# Patient Record
Sex: Female | Born: 1990 | Race: White | Hispanic: No | Marital: Single | Smoking: Never smoker
Health system: Southern US, Community
[De-identification: ages and names within clinical notes are randomized; demographics above are authoritative.]

## PROBLEM LIST (undated history)

## (undated) DIAGNOSIS — B009 Herpesviral infection, unspecified: Secondary | ICD-10-CM

## (undated) DIAGNOSIS — F3281 Premenstrual dysphoric disorder: Secondary | ICD-10-CM

## (undated) DIAGNOSIS — T7840XA Allergy, unspecified, initial encounter: Secondary | ICD-10-CM

## (undated) DIAGNOSIS — R42 Dizziness and giddiness: Secondary | ICD-10-CM

## (undated) DIAGNOSIS — E559 Vitamin D deficiency, unspecified: Secondary | ICD-10-CM

## (undated) DIAGNOSIS — F32A Depression, unspecified: Secondary | ICD-10-CM

## (undated) DIAGNOSIS — F419 Anxiety disorder, unspecified: Secondary | ICD-10-CM

## (undated) DIAGNOSIS — J45909 Unspecified asthma, uncomplicated: Secondary | ICD-10-CM

## (undated) DIAGNOSIS — F329 Major depressive disorder, single episode, unspecified: Secondary | ICD-10-CM

## (undated) DIAGNOSIS — K3184 Gastroparesis: Secondary | ICD-10-CM

## (undated) DIAGNOSIS — G4730 Sleep apnea, unspecified: Secondary | ICD-10-CM

## (undated) HISTORY — PX: APPENDECTOMY: SHX54

## (undated) HISTORY — DX: Sleep apnea, unspecified: G47.30

## (undated) HISTORY — DX: Vitamin D deficiency, unspecified: E55.9

## (undated) HISTORY — PX: LUMBAR DISC SURGERY: SHX700

## (undated) HISTORY — DX: Depression, unspecified: F32.A

## (undated) HISTORY — DX: Anxiety disorder, unspecified: F41.9

## (undated) HISTORY — DX: Major depressive disorder, single episode, unspecified: F32.9

## (undated) HISTORY — DX: Unspecified asthma, uncomplicated: J45.909

## (undated) HISTORY — DX: Gastroparesis: K31.84

## (undated) HISTORY — PX: ADENOIDECTOMY: SUR15

## (undated) HISTORY — DX: Herpesviral infection, unspecified: B00.9

## (undated) HISTORY — DX: Dizziness and giddiness: R42

## (undated) HISTORY — DX: Allergy, unspecified, initial encounter: T78.40XA

## (undated) HISTORY — DX: Premenstrual dysphoric disorder: F32.81

## (undated) HISTORY — PX: TONSILLECTOMY: SUR1361

## (undated) HISTORY — PX: TYMPANOSTOMY TUBE PLACEMENT: SHX32

## (undated) HISTORY — PX: CHOLECYSTECTOMY: SHX55

## (undated) HISTORY — PX: SPINE SURGERY: SHX786

## (undated) HISTORY — DX: Morbid (severe) obesity due to excess calories: E66.01

---
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
## 2009-04-11 ENCOUNTER — Encounter: Payer: Self-pay | Admitting: Family Medicine

## 2009-04-18 ENCOUNTER — Encounter: Payer: Self-pay | Admitting: Family Medicine

## 2009-09-01 ENCOUNTER — Ambulatory Visit: Payer: Self-pay | Admitting: Family Medicine

## 2009-09-18 HISTORY — PX: CHOLECYSTECTOMY: SHX55

## 2009-10-03 ENCOUNTER — Ambulatory Visit: Payer: Self-pay | Admitting: General Surgery

## 2009-10-10 ENCOUNTER — Ambulatory Visit: Payer: Self-pay | Admitting: General Surgery

## 2010-01-23 ENCOUNTER — Ambulatory Visit: Payer: Self-pay | Admitting: Family Medicine

## 2010-04-30 ENCOUNTER — Ambulatory Visit: Payer: Self-pay | Admitting: Gastroenterology

## 2010-07-11 ENCOUNTER — Ambulatory Visit: Payer: Self-pay | Admitting: Gastroenterology

## 2011-09-13 IMAGING — NM NUCLEAR MEDICINE HEPATOHBILIARY INCLUDE GB
2 series · 12 of 12 positions shown · non-contrast
Comparison: none

REASON FOR EXAM: RUQ abd pain
COMMENTS:

[Series 1000: gallbladder dynamic · 4.80mm/px · 6 of 60 frames shown]
[frame 6/60]
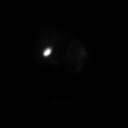
[frame 16/60]
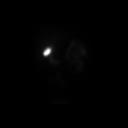
[frame 26/60]
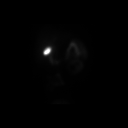
[frame 36/60]
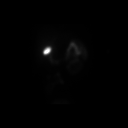
[frame 46/60]
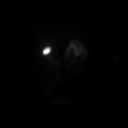
[frame 56/60]
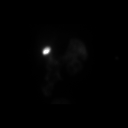

[Series 1000: gallbladder dynamic (results) · 4.80mm/px · 6 of 60 frames shown]
[frame 6/60]
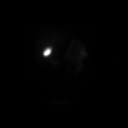
[frame 16/60]
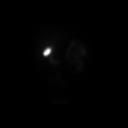
[frame 26/60]
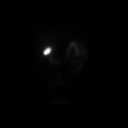
[frame 36/60]
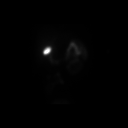
[frame 46/60]
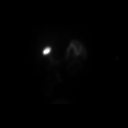
[frame 56/60]
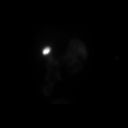

[12 of 12 positions shown; findings below may reference images not displayed]

PROCEDURE:     NM  - NM HEPATO WITH GB EJECT FRACTION  - September 01, 2009 [DATE]

RESULT:     Following intravenous administration of 8.04 mCi technetium 99m
Choletec, there is no prompt visualization of tracer activity in the liver
at 3 minutes. At 15 minutes, tracer activity is visualized in the
gallbladder, common duct and proximal small bowel.

The gallbladder ejection fraction at 30 minutes measures 29% which is below
the normal range.
IMPRESSION: 1. Normal hepatobiliary scan.
2. The gallbladder ejection fraction measures 29% which is below the normal
range.

## 2012-09-24 LAB — HM PAP SMEAR: HM Pap smear: NORMAL

## 2012-09-24 LAB — LIPID PANEL
Cholesterol: 171 mg/dL (ref 0–200)
HDL: 62 mg/dL (ref 35–70)
LDL Cholesterol: 90 mg/dL
Triglycerides: 97 mg/dL (ref 40–160)

## 2012-09-24 LAB — HEMOGLOBIN A1C: HEMOGLOBIN A1C: 5.5 % (ref 4.0–6.0)

## 2012-12-09 ENCOUNTER — Emergency Department: Payer: Self-pay | Admitting: Unknown Physician Specialty

## 2012-12-09 LAB — URINALYSIS, COMPLETE
Bilirubin,UR: NEGATIVE
Blood: NEGATIVE
Glucose,UR: NEGATIVE mg/dL (ref 0–75)
Ketone: NEGATIVE
Protein: NEGATIVE
RBC,UR: 2 /HPF (ref 0–5)
Specific Gravity: 1.023 (ref 1.003–1.030)

## 2012-12-29 DIAGNOSIS — Z9889 Other specified postprocedural states: Secondary | ICD-10-CM | POA: Insufficient documentation

## 2013-03-18 HISTORY — PX: APPENDECTOMY: SHX54

## 2013-04-08 LAB — COMPREHENSIVE METABOLIC PANEL
BUN: 14 mg/dL (ref 7–18)
Calcium, Total: 9.4 mg/dL (ref 8.5–10.1)
Co2: 25 mmol/L (ref 21–32)
Creatinine: 0.74 mg/dL (ref 0.60–1.30)
EGFR (African American): >60
EGFR (Non-African Amer.): >60
Osmolality: 272 (ref 275–301)
Potassium: 4.1 mmol/L (ref 3.5–5.1)
SGOT(AST): 22 U/L (ref 15–37)
SGPT (ALT): 29 U/L (ref 12–78)
Sodium: 136 mmol/L (ref 136–145)
Total Protein: 7.7 g/dL (ref 6.4–8.2)

## 2013-04-08 LAB — CBC
HCT: 39.2 % (ref 35.0–47.0)
HGB: 13 g/dL (ref 12.0–16.0)
MCH: 30.4 pg (ref 26.0–34.0)
MCV: 92 fL (ref 80–100)
Platelet: 239 10*3/uL (ref 150–440)
RBC: 4.27 10*6/uL (ref 3.80–5.20)
WBC: 15.2 10*3/uL — ABNORMAL HIGH (ref 3.6–11.0)

## 2013-04-08 LAB — URINALYSIS, COMPLETE
Bilirubin,UR: NEGATIVE
Glucose,UR: NEGATIVE mg/dL (ref 0–75)
Ketone: NEGATIVE
Ph: 6 (ref 4.5–8.0)
RBC,UR: 1 /HPF (ref 0–5)
Specific Gravity: 1.01 (ref 1.003–1.030)
WBC UR: 1 /HPF (ref 0–5)

## 2013-04-08 LAB — LIPASE, BLOOD: Lipase: 74 U/L (ref 73–393)

## 2013-04-09 ENCOUNTER — Observation Stay: Payer: Self-pay | Admitting: Surgery

## 2013-04-13 LAB — PATHOLOGY REPORT

## 2013-09-20 DIAGNOSIS — G43809 Other migraine, not intractable, without status migrainosus: Secondary | ICD-10-CM | POA: Insufficient documentation

## 2013-09-20 DIAGNOSIS — K589 Irritable bowel syndrome without diarrhea: Secondary | ICD-10-CM | POA: Insufficient documentation

## 2015-03-10 NOTE — H&P (Signed)
PATIENT NAME:  Tracey Perry, GUGGENHEIM MR#:  [id number] DATE OF BIRTH:  1991/10/22  DATE OF ADMISSION:  04/08/2013  CHIEF COMPLAINT:  Right lower quadrant pain.   HISTORY OF PRESENT ILLNESS:  This is a patient with approximately 18 hours of abdominal pain, started this morning when she woke up, stating that she was normal yesterday.  She has had nausea and multiple emeses.  Ate lunch at 1:30 this afternoon, but has been vomiting since.  She denies fevers or chills, has never had an episode like this before.  Has had some loose stools.  Pain is in the right lower quadrant only.  Denies back pain or dysuria.   PAST MEDICAL HISTORY:  Anxiety, asthma, depression.   ALLERGIES:  None.   MEDICATIONS:  Multiple, see chart.   FAMILY HISTORY:  Noncontributory.   PAST SURGICAL HISTORY:  Laparoscopic cholecystectomy in 2010 by Dr. Bary Castilla, recent cervical fusion in [place].  Tonsillectomy, adenoidectomy and ear tubes.   SOCIAL HISTORY:  The patient does not smoke or drink.  She is currently employed.   REVIEW OF SYSTEMS:  A 10 system review is performed and negative with the exception of that mentioned in the history of present illness.   PHYSICAL EXAMINATION: GENERAL:  Healthy, comfortable-appearing Caucasian female patient.  She has a BMI of 40, 240 pounds, 65 inches.  VITAL SIGNS:  Temp of 98.5, pulse of 98, respirations 18, blood pressure 115/70, 99% room air sats.  Pain scale says 0, was originally 7 and her pain scale was higher than that when I visited her.  She had not been medicated.  HEENT:  Shows no scleral icterus.  NECK:  No palpable neck nodes.  CHEST:  Clear to auscultation.  CARDIAC:  Regular rate and rhythm.  ABDOMEN:  Is showing right upper quadrant and periumbilical scars from prior laparoscopy and cholecystectomy.  She is maximally tender at McBurney's point with a positive Rovsing's sign.  No guarding.  There is some percussion tenderness.  EXTREMITIES:  Without edema.   NEUROLOGIC:  Grossly intact.  INTEGUMENT:  Shows no jaundice.   LABORATORY VALUES:  Demonstrate normal electrolytes.  White blood cell count is elevated at 15.2.  H and H of 13 and 39, a platelet count of 239.   CT scan is personally reviewed showing probable appendicitis.   ASSESSMENT AND PLAN:  This is a patient with probable appendicitis, both by history, physical and CT scanning.  She also has leukocytosis.  I have recommended laparoscopic appendectomy.  The rationale for this has been discussed.  The options of observation have been reviewed and the risk of bleeding, infection, recurrence of symptoms, failure to resolve her symptoms, negative laparoscopy and conversion to an open procedure, as well as bowel injury were all reviewed with her.  She understood and agreed to proceed.     ____________________________ Jerrol Banana Burt Knack, MD rec:ea D: 04/08/2013 22:58:19 ET T: 04/08/2013 23:22:43 ET JOB#: [id number]  cc: Jerrol Banana. Burt Knack, MD, <Dictator> Florene Glen MD ELECTRONICALLY SIGNED 04/09/2013 6:50

## 2015-03-10 NOTE — Op Note (Signed)
PATIENT NAME:  Tracey Perry, Tracey Perry MR#:  [id number] DATE OF BIRTH:  December 26, 1990  DATE OF PROCEDURE:  04/09/2013  PREOPERATIVE DIAGNOSIS: Acute appendicitis.   POSTOPERATIVE DIAGNOSIS: Acute appendicitis.   PROCEDURE: Laparoscopic appendectomy.   SURGEON: Kaytie Ratcliffe E. Burt Knack, MD  ANESTHESIA: General with endotracheal tube.   INDICATIONS: This is a patient with progressive right lower quadrant pain and tenderness with signs of peritoneal irritation and workup showing probable appendicitis. Preoperatively, we discussed rationale for surgery, the options of observation, risks of bleeding, infection, recurrence of symptoms, failure to resolve her symptoms, conversion to an open procedure and negative laparoscopy. She understood and agreed to proceed.   FINDINGS: Acute appendicitis in a retrocecal position.   DESCRIPTION OF PROCEDURE: The patient was induced to general anesthesia, given IV antibiotics, VT prophylaxis was in place. She was prepped and draped in a sterile fashion. Marcaine was infiltrated in skin and subcutaneous tissues around the periumbilical area, where incision was made. Veress needle was placed. Pneumoperitoneum was obtained, and a 5 mm trocar port was placed. The abdominal cavity was explored, and under direct vision, a 12 mm left lateral port and a suprapubic 5 mm port were placed. The appendix was found in the right lateral position somewhat retrocecal. It was elevated. The base of the appendix was identified and dissected, and the standard load Endo GIA was fired across the base of the appendix, and then a vascular load Endo GIA was fired across the mesoappendix. The specimen was passed out through the lateral port site with the aid of an Endo Catch bag. The area was checked for hemostasis and found to be adequate with the exception of a bleeder on the staple line. This was controlled with a clip, and then hemostasis was checked, and at this point, it was adequate. There was no  further bleeding. The left lateral port site was closed with interrupted simple sutures of 0 Vicryl utilizing an Endo Close technique. Again, hemostasis was checked and found to be adequate. Therefore, pneumoperitoneum was released. All ports were removed. The 4-0 subcuticular Monocryl was used on all skin edges. Steri-Strips, Mastisol and sterile dressings were placed.   The patient tolerated the procedure well. There were no complications. She was taken to the recovery room in stable condition to be admitted for continued care.    ____________________________ Jerrol Banana. Burt Knack, MD rec:OSi D: 04/09/2013 02:06:11 ET T: 04/09/2013 06:43:45 ET JOB#: [id number]  cc: Jerrol Banana. Burt Knack, MD, <Dictator> Florene Glen MD ELECTRONICALLY SIGNED 04/16/2013 19:56

## 2015-03-10 NOTE — H&P (Signed)
Subjective/Chief Complaint RLQ pain   History of Present Illness started this am, rlq nop prior episode no f/c n/v worsening   Past History PMH anxiety asthma PSH ear tubes, T&A neck fusion lapchole   ALLERGIES:  No Known Allergies:   Family and Social History:  Family History Non-Contributory   Social History negative tobacco, negative ETOH   Place of Living Home   Review of Systems:  Fever/Chills No   Cough No   Abdominal Pain Yes   Constipation No   Nausea/Vomiting Yes   SOB/DOE No   Chest Pain No   Dysuria No   Tolerating Diet No  Nauseated  Vomiting   Physical Exam:  GEN no acute distress, obese   HEENT pink conjunctivae   NECK supple  left ant scar   RESP normal resp effort  clear BS   CARD regular rate   ABD positive tenderness  soft  scars, pos rosving's sign. tender rlq perc tenderness   LYMPH negative neck   EXTR negative edema   SKIN normal to palpation   PSYCH alert, A+O to time, place, person, good insight   Lab Results: Hepatic:  22-May-14 17:36   Bilirubin, Total 0.2  Alkaline Phosphatase 96  SGPT (ALT) 29  SGOT (AST) 22  Total Protein, Serum 7.7  Albumin, Serum 3.6  Routine Chem:  22-May-14 17:36   Glucose, Serum 86  BUN 14  Creatinine (comp) 0.74  Sodium, Serum 136  Potassium, Serum 4.1  Chloride, Serum 106  CO2, Serum 25  Calcium (Total), Serum 9.4  Osmolality (calc) 272  eGFR (African American) >60  eGFR (Non-African American) >60 (eGFR values <82m/min/1.73 m2 may be an indication of chronic kidney disease (CKD). Calculated eGFR is useful in patients with stable renal function. The eGFR calculation will not be reliable in acutely ill patients when serum creatinine is changing rapidly. It is not useful in  patients on dialysis. The eGFR calculation may not be applicable to patients at the low and high extremes of body sizes, pregnant women, and vegetarians.)  Anion Gap  5  Lipase 74 (Result(s) reported  on 08 Apr 2013 at 06:01PM.)  Routine UA:  22-May-14 17:36   Color (UA) Straw  Clarity (UA) Clear  Glucose (UA) Negative  Bilirubin (UA) Negative  Ketones (UA) Negative  Specific Gravity (UA) 1.010  Blood (UA) 2+  pH (UA) 6.0  Protein (UA) Negative  Nitrite (UA) Negative  Leukocyte Esterase (UA) Negative (Result(s) reported on 08 Apr 2013 at 06:38PM.)  RBC (UA) 1 /HPF  WBC (UA) 1 /HPF  Bacteria (UA) TRACE  Epithelial Cells (UA) 1 /HPF  Mucous (UA) PRESENT (Result(s) reported on 08 Apr 2013 at 06:38PM.)  Routine Hem:  22-May-14 17:36   WBC (CBC)  15.2  RBC (CBC) 4.27  Hemoglobin (CBC) 13.0  Hematocrit (CBC) 39.2  Platelet Count (CBC) 239 (Result(s) reported on 08 Apr 2013 at 05:56PM.)  MCV 92  MCH 30.4  MCHC 33.1  RDW 13.4   Radiology Results: CT:    22-May-14 22:04, CT Abdomen and Pelvis With Contrast  CT Abdomen and Pelvis With Contrast  REASON FOR EXAM:    (1) pain; (2) pain  COMMENTS:       PROCEDURE: CT  - CT ABDOMEN / PELVIS  W  - Apr 08 2013 10:04PM     RESULT: CT scanning was performed through the abdomen and pelvis with   reconstructions at 3 mm intervals and slice thicknesses. The patient   received 100  cc of Isovue-370 and also received oral contrast material.   Review of multiplanar reconstructed images was performed separately on   the VIA monitor.    On the scout topogram gaseous distention of the stomach as well as   numerous loops of small and large bowel is demonstrated. The orally   administered contrast however has traversed much of the small bowel and   reached as far distally as the proximal sigmoid colon. There is an     inflamed, edematous appendix demonstrated on images 90 through 114. It   measures approximately 13 mm in greatest dimension and there is   surrounding inflammatory change. There is no evidence of free fluid or   free air within the abdomen or pelvis.     The gallbladder is surgically absent. The liver, pancreas, spleen,    partially distended stomach, adrenal glands, and kidneys are normal in   appearance. The caliber of the abdominal aorta is normal. There is a   retroaortic left renal vein which is a normal variant. There is no   periaortic or pericaval lymphadenopathy. A few prominent lymph nodes are   demonstrated in the right aspect of the mesentery measuring up to 13 mm   in greatest dimension.    Within the pelvis the urinary bladder, uterus, and adnexal structures are   normal in appearance. There is no inguinal nor umbilical hernia.  The lung bases are clear. The lumbar vertebral bodies are preserved in   height.    IMPRESSION:   1. The appendix appears edematous and there is periappendiceal   inflammatory change. There is no evidence of a periappendiceal abscess.   There are a few mildly enlarged mesenteric lymph nodes to the right   midline. The findings are consistent with acute appendicitis. There is no   evidence of rupture.  2. There is no acute hepatobiliary abnormality nor acute urinary tract   abnormality.  3. The lung bases are clear.     Dictation Site: 5    Verified By: DAVID A. Martinique, M.D., MD    Assessment/Admission Diagnosis acute appendicitis rec lap appy risks and options rev'd agrees with plan   Electronic Signatures: Florene Glen (MD)  (Signed 22-May-14 22:54)  Authored: CHIEF COMPLAINT and HISTORY, ALLERGIES, FAMILY AND SOCIAL HISTORY, REVIEW OF SYSTEMS, PHYSICAL EXAM, LABS, Radiology, ASSESSMENT AND PLAN   Last Updated: 22-May-14 22:54 by Florene Glen (MD)

## 2015-04-25 ENCOUNTER — Encounter: Payer: Self-pay | Admitting: Licensed Clinical Social Worker

## 2015-04-25 NOTE — Progress Notes (Signed)
### [facility] LCSW Progress Note:    Tracey Perry 04/12/2015 10:08 AM Location: [facility] Patient #: [id number] DOB: 03-May-1991 Undefined / Language: Cleophus Molt / Race: White Female    History of Present Illness(Tracey Perry N Sollie Vultaggio; 04/24/2015 8:19 AM) The patient is a 24 year old female who presents for a recheck of Depressive disorders. Symptoms include depressed mood (Improved. Has episodic sadness yet denied this as disabling.), fatigue (At present this is related to recent dx of Mono.) and weight gain, while symptoms do not include loss of interest, hopelessness, crying spells, sense of failure, psychomotor retardation, appetite change, poor sleep, hypersomnia or irritability. The patient describes this as mild and improving. Symptoms are exacerbated by emotional stress, fatigue, family stressors, job stressors and menstrual cycles. Symptoms are relieved by antidepressants, psychotherapy and quiet environment. Note for "Depressive disorders": [name] has started seeing Dr. Cephus Shelling for medication management.  Additional reason for visit:  Recheck of Anxietyis described as the following: Symptoms include anxiety and fatigue, while symptoms do not include difficulty concentrating, excessive worry, insomnia, irritability, muscle tension, nervousness, panic attacks or sleep disruption. The patient describes this as mild and improving. Symptoms are exacerbated by new situations, financial problems, job stress and menstruation.  Note: Start Time: 10:08 a.m.  Tracey Perry returns to [facility] to address depression, mood swings, SIB and anxiety. Overall with increasing effectiveness of medications combined with client's own coping skills and use of CBT and other strategies, she is internalizing less. She discssued a set back below.  "It's better. I'm getting to work on time most days." About one week ago Tracey Perry described "the day from hell." Recalled time  recently where her mood was really up when spending time with female friend, Evelena Peat but then it crashed due to some work related harrassment. Current ethic female co-worker has made sexual comments about her breasts on two occasions. Because the company has an emphasis on having a certain amount of diversity. New female was hired who is nice and there is some hope that he will role model better before. "I'm actively uncomfortable being around him." She is having increased experiences of being "skiddish" around him. There is also a flirtatousness that ". . . gives me the creeps."  Client talked about a Saturday at work stating "You would have been proud of me. I was proud." "It was crazy busy at work." "It should have been a bad day." Followed up by going to a Church event with her father/girlfriend for a few hours.   "I was mad at myself because I let Tracey Perry' comments get to me." "There's my insight."  LCSW commended her on recognizing her triggers and applying various coping skills to distract herself and calm self to prevent emotional decompensation and behavioral impulsivity. Supportive psychotherapy with insight and emotional support and encouragement around her progress.  Explored her thoughts about outocome of therapy process on presenting problems. Sharlot voiced definite benefits from the psychoeducation, support and introduction of various therapeutic models available to improve coping skills.  No new concerns were voiced.  PLAN: Follow up within 2-3 weeks or PRN. Client will continue to take meds as prescribed by [name] and keep all appointments.    Medication History(Tracey Perry N Tracey Perry; 04/12/2015 11:07 AM) Viibryd (40MG  Tablet, 1 Oral daily, Taken starting 08/31/2014) Active. Wellbutrin XL (300MG  Tablet ER 24HR, 1 (one) Oral daily, Taken starting 08/31/2014) Active. Ativan (0.5MG  Tablet, 1 (one) Tablet Oral daily, Taken starting 08/31/2014) Active. QUEtiapine Fumarate (50MG   Tablet, 1 (one)  Tablet Oral at bedtime, Taken starting 08/31/2014) Active. Mirena (20MCG/24HR IUD, Intrauterine) Active. (Inserted about 2-2 1/2 months ago. She contributes this to improvement in moods.) Depakote (500MG  Tablet DR, 2 tabs Oral QHS) Active. Seasonique (0.15-0.03 &0.01MG  Tablet, Oral) Active. Claritin (10MG  Tablet, Oral) Active. Depakote (250MG  Tablet DR, Oral) Active. Vi t 2000 IU qdaily Active. ValACYclovir HCl (500MG  Tablet, Oral) Active. Magnesium Gluconate (250MG  Tablet, 2 Oral daily) Active. Gabapentin (300MG  Tablet, 1 tab Oral QHS) Active. (Added by her Neurosurgeon at [facility] to help nerve tingling)    Review of Systems([name] N Ky Moskowitz; 04/13/2015 8:55 AM) Psychiatric:Present- Anxiety (Directly related to car problems and likelihood that she will need to purchase a new car along with paying student loans.), Depression ("Today I'm good but tomorrow I'm not looking forward to."), Feels safe at home, Inability to Concentrate (Not depression related and more related to fatique per [name].), Insomnia ("It comes and goes.") and Mood changes (Described her mood swings as worse.). Not Present- Attention Deficit Disorder, Change in Sleep Pattern, Decrease attention, Decrease concentration, Delirium, Delusions, Disorientation, Early Awakening, Easily Irritated, Fearful, Frequent crying (More frequent in the past 3 weeks. These episodes are still not to the degree that were when depression was at it's worse.), Hallucinations, Hypersomnia, Impaired Cognitive Function, Memory Loss, Nervousness, Panic Attacks, Suicidal Ideation, Suicidal Planning and Trouble Falling Asleep. Note:No SIB reported.  LCSW had client to complete a Mood Disorder Questionnaire given that she has concern that she may have Bi-Polar disorder.  Of the items in Section 1, Client had 9 Yes and 4 No responses Under Section 2 she checked Yes Section 3 she indicated: Minor Problem  Finally, she answered  Yes to both feelings of being depressed, down, etc. and being bothered by little interest or pleasure in doing things.    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 Validated client's feelings and emotions voiced as part of the stressors experienced when living with chronic medical problems and mood disorders. 2. Encouraged client to highlight the positives in her own life and to focus on what she can and does do versus what she thinks she cannot or that others are keeping her from doing. 3. Psychoeducation on ineffective problem solving and the impact of cognitive restructuring to enhance outcomes and coping to include not seeing self as a victim in all circumstances. 4. Psychosocial support & therapy with insight and commended client on use of available protective factors.    Anxiety, generalized (300.02  F41.1) Current Plans l Short Term Goal: Identify Triggers for Symptoms  l Intervention: Stress Management 1. Explore cognitive messages that mediate anxiety response, i.e. family rules, roles & cultural messages for women and retrain in more realistic/rational beliefs. 2. Help client develop reality-based cognitive messages that will increase self-confidence in coping with both rational and irrational fears. 3. Educate on and reinforce appropriate development of boundaries to include realistic views of other people's approval and not depending on it to feel worthy or accepted.   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 Client verbal, denied SIB and no longer having urges or impulse to engage in harmful behaviors. Current Plans l Short Term Goal: Patient will be  Able to Communicate Needs or Concerns  l Intervention: Assertiveness Skills 1. Encouraged contacts with  LCSW and this clinic if symptoms worsen and also instructed to call 911 or come to the ER. Reinforced importance of self care, involvement in meaningful activities and with primary support people in his/her life. 2. Supportive therapy with insight, reinforced client's strengths, encouraged ongoing expression of feelings, thoughts, concerns and needs and validated feelings and experiences vented today during session. 3. Highlighted the importance of recognizing that by not avoiding negative feelings and finding healthier ways to experience these, she is decreasing the role of SIB in her life. 4. Commended on recent strategies to avoid engaging in SIB.  l Level of Participation: Interactive  l Patient Strength: Motivated for Treatment  l Patient Strength: Average Intellecutal Ability  l Patient Strength: Family involvement or Support  l Patient Strength: Pensions consultant Available  l Patient Strength: Verbal  l Patient Strength: Stable Housing  l Patient Strength: Aware of Need for Medications  l INDIVIDUAL PSYCHOTHERAPY FOR 45 TO 50 MINUTES (18343) l Follow up in 2 weeks or as needed    Signed electronically by Eustacia Urbanek N Madgeline Rayo (04/24/2015 8:21 AM)
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
## 2015-04-26 ENCOUNTER — Ambulatory Visit (INDEPENDENT_AMBULATORY_CARE_PROVIDER_SITE_OTHER): Payer: Commercial Managed Care - PPO | Admitting: Licensed Clinical Social Worker

## 2015-04-26 ENCOUNTER — Other Ambulatory Visit: Payer: Self-pay

## 2015-04-26 DIAGNOSIS — F489 Nonpsychotic mental disorder, unspecified: Secondary | ICD-10-CM

## 2015-04-26 DIAGNOSIS — F3130 Bipolar disorder, current episode depressed, mild or moderate severity, unspecified: Secondary | ICD-10-CM

## 2015-04-26 DIAGNOSIS — F411 Generalized anxiety disorder: Secondary | ICD-10-CM

## 2015-04-26 DIAGNOSIS — Z7289 Other problems related to lifestyle: Secondary | ICD-10-CM
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
## 2015-05-09 ENCOUNTER — Other Ambulatory Visit: Payer: Self-pay

## 2015-05-09 ENCOUNTER — Ambulatory Visit (INDEPENDENT_AMBULATORY_CARE_PROVIDER_SITE_OTHER): Payer: BC Managed Care – PPO | Admitting: Licensed Clinical Social Worker

## 2015-05-09 ENCOUNTER — Ambulatory Visit: Payer: Self-pay | Admitting: Licensed Clinical Social Worker

## 2015-05-09 DIAGNOSIS — F3130 Bipolar disorder, current episode depressed, mild or moderate severity, unspecified: Secondary | ICD-10-CM | POA: Diagnosis not present

## 2015-05-09 DIAGNOSIS — Z7289 Other problems related to lifestyle: Secondary | ICD-10-CM

## 2015-05-09 DIAGNOSIS — F411 Generalized anxiety disorder: Secondary | ICD-10-CM | POA: Diagnosis not present

## 2015-05-09 DIAGNOSIS — F489 Nonpsychotic mental disorder, unspecified: Secondary | ICD-10-CM
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
## 2015-05-26 ENCOUNTER — Ambulatory Visit: Payer: BC Managed Care – PPO | Admitting: Licensed Clinical Social Worker

## 2015-05-30 ENCOUNTER — Ambulatory Visit (INDEPENDENT_AMBULATORY_CARE_PROVIDER_SITE_OTHER): Payer: Commercial Managed Care - PPO | Admitting: Licensed Clinical Social Worker

## 2015-05-30 ENCOUNTER — Ambulatory Visit (INDEPENDENT_AMBULATORY_CARE_PROVIDER_SITE_OTHER): Payer: Commercial Managed Care - PPO | Admitting: Family Medicine

## 2015-05-30 ENCOUNTER — Encounter: Payer: Self-pay | Admitting: Family Medicine

## 2015-05-30 VITALS — BP 116/64 | HR 116 | Temp 99.1°F | Resp 18 | Ht 65.0 in | Wt 275.6 lb

## 2015-05-30 DIAGNOSIS — F411 Generalized anxiety disorder: Secondary | ICD-10-CM | POA: Diagnosis not present

## 2015-05-30 DIAGNOSIS — N390 Urinary tract infection, site not specified: Secondary | ICD-10-CM | POA: Diagnosis not present

## 2015-05-30 DIAGNOSIS — F3130 Bipolar disorder, current episode depressed, mild or moderate severity, unspecified: Secondary | ICD-10-CM

## 2015-05-30 DIAGNOSIS — R42 Dizziness and giddiness: Secondary | ICD-10-CM | POA: Insufficient documentation

## 2015-05-30 DIAGNOSIS — Z8709 Personal history of other diseases of the respiratory system: Secondary | ICD-10-CM | POA: Insufficient documentation

## 2015-05-30 DIAGNOSIS — R300 Dysuria: Secondary | ICD-10-CM

## 2015-05-30 DIAGNOSIS — J302 Other seasonal allergic rhinitis: Secondary | ICD-10-CM | POA: Insufficient documentation

## 2015-05-30 DIAGNOSIS — N946 Dysmenorrhea, unspecified: Secondary | ICD-10-CM | POA: Insufficient documentation

## 2015-05-30 DIAGNOSIS — Z87448 Personal history of other diseases of urinary system: Secondary | ICD-10-CM | POA: Insufficient documentation

## 2015-05-30 DIAGNOSIS — G43009 Migraine without aura, not intractable, without status migrainosus: Secondary | ICD-10-CM | POA: Insufficient documentation

## 2015-05-30 DIAGNOSIS — K3184 Gastroparesis: Secondary | ICD-10-CM | POA: Insufficient documentation

## 2015-05-30 DIAGNOSIS — R110 Nausea: Secondary | ICD-10-CM

## 2015-05-30 LAB — POCT URINALYSIS DIPSTICK
Bilirubin, UA: NEGATIVE
Glucose, UA: NEGATIVE
Ketones, UA: NEGATIVE
LEUKOCYTES UA: NEGATIVE
NITRITE UA: NEGATIVE
PH UA: 6.5
PROTEIN UA: NEGATIVE
Spec Grav, UA: 1.01
Urobilinogen, UA: 0.2

## 2015-05-30 MED ORDER — ONDANSETRON HCL 4 MG PO TABS: 4.0000 mg | ORAL_TABLET | Freq: Three times a day (TID) | ORAL | Status: DC | PRN

## 2015-05-30 MED ORDER — SULFAMETHOXAZOLE-TRIMETHOPRIM 800-160 MG PO TABS: 1.0000 | ORAL_TABLET | Freq: Two times a day (BID) | ORAL | Status: DC

## 2015-05-30 NOTE — Progress Notes (Signed)
THERAPIST PROGRESS NOTE  Session Time: 1:05 p.m. - 2:15 p.m.  Participation Level: Active  Behavioral Response: NeatAlertAnxious and Depressed  Type of Therapy: Individual Therapy  Treatment Goals addressed: Anxiety and Coping  Interventions: CBT, Solution Focused, Strength-based, Supportive and Reframing  Summary: Tracey Perry is a 24 y.o. female who presents with Bi-Polar Disorder, MRE, Depressed.  Client is casually groomed, affect and mood were much more upbeat compared to recent session.  She started Saphris about two weeks as per [name]. "I'm all over the place." Tracey Perry described these experiences and voiced belief that "Maybe I'm just feeling normal again." Denied manics or dark depressed state. Had first panic attack in six months on Friday of last week at her mother's home.  She was able to identify a trigger of a video that she watched as likely precipitating event. Overall and in spite of most recent panic attack, she is more aware overall of how she is feeling and differences in her way of thinking about/processing information. Tracey Perry did get a new car and feels safer driving back and forth to work.  She will establish with Dr. Jimmye Norman on 8//3/16,  in the practice for medication management since [name] is not her insurance network. She is tolerating the new mood stabilizer but wanting to give it more time.  Described self as feeling more "up" since starting the medication. "I can't find the lows." These ups were not described as hypomania or mania. There is some agreement with LCSW that the medication may be improving impulse control and on a positive note that client did not conclude until discussion in therapy today, there is no longer that part in her mind where she no longer thinks about self harm.   She has lost her appetite but still eating and has lost a few pounds.  Hypersomnia is improving as well with client stating: "I haven't napped in the past few  weeks."  Instead she reported that she has been reading more.   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  Maintains some contact with female friend and with a cautious optimism that maybe she is in remission of her mood and behavioral symptoms with the new medication.  "I don't want to get too excited because usually what is followed by this is a crash."  Client able to draw connections between having learned more about her illness, symptoms, triggers and treatments and maturation combined with new coping skills to help her to better manage her illness.    Previous focus of treatment, self harm, is resolved per client's indication and mood regulation seems to be improving at a steady speed.  At this point, Tracey Perry is uncertain what she would like to focus her time on in therapy.  Suicidal/Homicidal: Negativewithout intent/plan  Therapist Response:   Marge Duncans on her progress and reinforced her internal strengths and highlighted her increased socialization and investment in her live in comparison to her status at the start of treatment. Processed with her those insights and connections that she has come to in order to feel more stable and aware of warning signs. Reviewed with client the importance of identifying strengths and resources for achieving goals to stabilize moods, thoughts and behaviors.  Reinforced importance of using tools such as the mood monitoring/tracking app on her smart phone and mentioned additional app for CBT which can further assist her to recognize irrational beliefs  and replace these with more rational ones, thereby using cognitive re-frame to keep moods and behaviors stable.  Encouraged ongoing expression of needs, concerns, feelings, symptoms and any distressing thoughts that she finds self still struggling with.   Plan: Return again in three weeks or PRN.  Tracey Perry will continue to use  coping skills and psycho-educational materials provided to maintain stability in moods and behaviors.  She will keep all appointments and report any adverse affects of medications to her MD.  Diagnosis: BiPolar Disorder, Most Recent Episode, Depressed, Moderate      Generalized Anxiety Disorder   Miguel Dibble, LCSW 05/30/2015

## 2015-05-30 NOTE — Progress Notes (Signed)
Name: Tracey Perry   MRN: [id number]    DOB: 1991-08-10   Date:05/30/2015       Progress Note  Subjective  Chief Complaint  Chief Complaint  Patient presents with  . Dysuria    Onset-few weeks, Burning, low grade fever, frequency, urgency    HPI  Dysuria: she has a history of pyelonephritis. She developed symptoms a few weeks ago. She has some dysuria, low grade temperature, urinary frequency, urgency , but no nocturia, no vomiting. She is on IUD and cramps once a month, started yesterday with some spotting and also has upper back on the right side.   Patient Active Problem List   Diagnosis Date Noted  . History of pyelonephritis 05/30/2015  . Dysmenorrhea 05/30/2015  . History of asthma 05/30/2015  . Migraine without aura and responsive to treatment 05/30/2015  . Extreme obesity 05/30/2015  . PMD (progressive muscular dystrophy) 05/30/2015  . Allergic rhinitis, seasonal 05/30/2015  . Vertigo 05/30/2015  . Anxiety 05/09/2015  . Depression (emotion) 09/20/2013  . IBS (irritable bowel syndrome) 09/20/2013  . H/O cervical spine surgery 12/29/2012  . Vitamin D deficiency 11/23/2009  . History of mononucleosis 04/03/2009  . Herpes 11/16/2008    Past Surgical History  Procedure Laterality Date  . Cholecystectomy    . Lumbar disc surgery    . Tonsillectomy    . Adenoidectomy    . Tympanostomy tube placement      Family History  Problem Relation Age of Onset  . Depression Mother   . Hypertension Mother     controlled  . Allergic rhinitis Mother   . Diabetes Father     controlled  . Hypertension Father   . Allergic rhinitis Father   . Depression Father   . Depression Brother     History   Social History  . Marital Status: Single    Spouse Name: N/A  . Number of Children: N/A  . Years of Education: N/A   Occupational History  . Not on file.   Social History Main Topics  . Smoking status: Not on file  . Smokeless tobacco: Never Used  . Alcohol Use:  No     Comment: Occasionally  . Drug Use: No  . Sexual Activity: No   Other Topics Concern  . Not on file   Social History Narrative     Current outpatient prescriptions:  .  asenapine (SAPHRIS) 5 MG SUBL 24 hr tablet, Place 5 mg under the tongue 1 day or 1 dose., Disp: , Rfl:  .  buPROPion (WELLBUTRIN XL) 300 MG 24 hr tablet, Take 300 mg by mouth daily., Disp: , Rfl: 2 .  cetirizine (ZYRTEC) 10 MG tablet, Take by mouth., Disp: , Rfl:  .  fluticasone (FLONASE) 50 MCG/ACT nasal spray, 2 sprays by Each Nare route daily., Disp: , Rfl:  .  gabapentin (NEURONTIN) 300 MG capsule, Take 300 mg by mouth., Disp: , Rfl:  .  levonorgestrel (MIRENA, 52 MG,) 20 MCG/24HR IUD, by Intrauterine route., Disp: , Rfl:  .  LORazepam (ATIVAN) 0.5 MG tablet, Take 0.5 mg by mouth., Disp: , Rfl:  .  valACYclovir (VALTREX) 1000 MG tablet, Take 1,000 mg by mouth., Disp: , Rfl:  .  VIIBRYD 40 MG TABS, Take 40 mg by mouth daily., Disp: , Rfl: 2 .  albuterol (PROAIR HFA) 108 (90 BASE) MCG/ACT inhaler, Inhale into the lungs., Disp: , Rfl:  .  chlorpheniramine-HYDROcodone (TUSSIONEX) 10-8 MG/5ML SUER, Take by mouth., Disp: , Rfl:  .  divalproex (DEPAKOTE ER) 500 MG 24 hr tablet, Take 1,000 mg by mouth at bedtime., Disp: , Rfl: 2 .  QUEtiapine (SEROQUEL) 50 MG tablet, Take by mouth., Disp: , Rfl:   Allergies  Allergen Reactions  . Nutritional Supplements Itching and Swelling    ONLY certain "FRESH FRUITS" cause throat itches & in spring, throat feels like it will swell.  . Prednisone Other (See Comments) and Palpitations    Severe mood swings and tachycardia     ROS  Constitutional: Negative for fever or weight change.  Respiratory: Negative for cough and shortness of breath.   Cardiovascular: Negative for chest pain or palpitations.  Gastrointestinal: Negative for abdominal pain, no bowel changes. She gets nauseated with medication Musculoskeletal: Negative for gait problem or joint swelling.  Skin:  Negative for rash.  Neurological: Negative for dizziness or headache.  Psychiatric: depression is under control but having mood swings, on Saphris now No other specific complaints in a complete review of systems (except as listed in HPI above).  Objective  Filed Vitals:   05/30/15 1102  BP: 116/64  Pulse: 116  Temp: 99.1 F (37.3 C)  TempSrc: Oral  Resp: 18  Height: 5\' 5"  (1.651 m)  Weight: 275 lb 9.6 oz (125.011 kg)  SpO2: 98%    Body mass index is 45.86 kg/(m^2).  Physical Exam  Constitutional: Patient appears well-developed and well-nourished. No distress. Obesity Eyes:  No scleral icterus. PERL Neck: Normal range of motion. Neck supple. Cardiovascular: Normal rate, regular rhythm and normal heart sounds.  No murmur heard. No BLE edema. Pulmonary/Chest: Effort normal and breath sounds normal. No respiratory distress. Abdominal: Tenderness supra pubic , mild pain on right mid back Psychiatric: Patient has a normal mood and affect. behavior is normal. Judgment and thought content normal.  Recent Results (from the past 2160 hour(s))  POCT urinalysis dipstick     Status: Abnormal   Collection Time: 05/30/15 11:05 AM  Result Value Ref Range   Color, UA dark    Clarity, UA clear    Glucose, UA neg    Bilirubin, UA neg    Ketones, UA neg    Spec Grav, UA 1.010    Blood, UA trace    pH, UA 6.5    Protein, UA neg    Urobilinogen, UA 0.2    Nitrite, UA neg    Leukocytes, UA Negative Negative     PHQ2/9: Depression screen PHQ 2/9 05/30/2015  Decreased Interest 0  Down, Depressed, Hopeless 1  PHQ - 2 Score 1     Fall Risk: Fall Risk  05/30/2015  Falls in the past year? No    Assessment & Plan  1. UTI (urinary tract infection), uncomplicated Unlikely but possible pyelo, start antibiotics and get culture and call back if no improvement - POCT urinalysis dipstick - sulfamethoxazole-trimethoprim (BACTRIM DS,SEPTRA DS) 800-160 MG per tablet; Take 1 tablet by mouth  2 (two) times daily.  Dispense: 10 tablet; Refill: 0  2. Dysuria  - Urine Culture  3. Nausea  - ondansetron (ZOFRAN) 4 MG tablet; Take 1 tablet (4 mg total) by mouth every 8 (eight) hours as needed for nausea or vomiting.  Dispense: 20 tablet; Refill: 0

## 2015-05-31 DIAGNOSIS — F411 Generalized anxiety disorder: Secondary | ICD-10-CM | POA: Insufficient documentation

## 2015-05-31 DIAGNOSIS — F319 Bipolar disorder, unspecified: Secondary | ICD-10-CM | POA: Insufficient documentation

## 2015-06-01 ENCOUNTER — Telehealth: Payer: Self-pay

## 2015-06-01 LAB — URINE CULTURE

## 2015-06-01 NOTE — Telephone Encounter (Signed)
Left vm for patient to return my call for lab results. 

## 2015-06-01 NOTE — Telephone Encounter (Signed)
-----   Message from Steele Sizer, MD sent at 06/01/2015  8:16 AM EDT ----- Urine culture negative, she can stop antibiotics, likely cramping from cycle. How is she doing?

## 2015-06-21 ENCOUNTER — Ambulatory Visit (INDEPENDENT_AMBULATORY_CARE_PROVIDER_SITE_OTHER): Payer: Commercial Managed Care - PPO | Admitting: Licensed Clinical Social Worker

## 2015-06-21 ENCOUNTER — Ambulatory Visit (INDEPENDENT_AMBULATORY_CARE_PROVIDER_SITE_OTHER): Payer: Commercial Managed Care - PPO | Admitting: Psychiatry

## 2015-06-21 ENCOUNTER — Encounter: Payer: Self-pay | Admitting: Psychiatry

## 2015-06-21 VITALS — BP 124/82 | HR 90 | Temp 97.6°F | Ht 65.0 in | Wt 277.4 lb

## 2015-06-21 DIAGNOSIS — Z7289 Other problems related to lifestyle: Secondary | ICD-10-CM | POA: Insufficient documentation

## 2015-06-21 DIAGNOSIS — K3184 Gastroparesis: Secondary | ICD-10-CM | POA: Insufficient documentation

## 2015-06-21 DIAGNOSIS — F39 Unspecified mood [affective] disorder: Secondary | ICD-10-CM | POA: Insufficient documentation

## 2015-06-21 DIAGNOSIS — F3130 Bipolar disorder, current episode depressed, mild or moderate severity, unspecified: Secondary | ICD-10-CM

## 2015-06-21 DIAGNOSIS — Z8619 Personal history of other infectious and parasitic diseases: Secondary | ICD-10-CM | POA: Insufficient documentation

## 2015-06-21 DIAGNOSIS — F332 Major depressive disorder, recurrent severe without psychotic features: Secondary | ICD-10-CM

## 2015-06-21 DIAGNOSIS — F331 Major depressive disorder, recurrent, moderate: Secondary | ICD-10-CM | POA: Insufficient documentation

## 2015-06-21 DIAGNOSIS — Z87440 Personal history of urinary (tract) infections: Secondary | ICD-10-CM | POA: Insufficient documentation

## 2015-06-21 DIAGNOSIS — F411 Generalized anxiety disorder: Secondary | ICD-10-CM

## 2015-06-21 MED ORDER — ASENAPINE MALEATE 10 MG SL SUBL: 10.0000 mg | SUBLINGUAL_TABLET | Freq: Every day | SUBLINGUAL | Status: DC

## 2015-06-21 MED ORDER — LORAZEPAM 0.5 MG PO TABS: 0.5000 mg | ORAL_TABLET | Freq: Every day | ORAL | Status: DC | PRN

## 2015-06-21 MED ORDER — BUPROPION HCL ER (XL) 300 MG PO TB24: 300.0000 mg | ORAL_TABLET | Freq: Every day | ORAL | Status: DC

## 2015-06-21 MED ORDER — VIIBRYD 40 MG PO TABS: 40.0000 mg | ORAL_TABLET | Freq: Every day | ORAL | Status: DC

## 2015-06-21 NOTE — Progress Notes (Signed)
   THERAPIST PROGRESS NOTE  Session Time: 10:14 a.m. - 11:20 a.m.  Participation Level: Active  Behavioral Response: NeatAlertAngry, Anxious and Euthymic  Type of Therapy: Individual Therapy  Treatment Goals addressed: Anger, Anxiety and Coping  Interventions: CBT, Solution Focused, Supportive and Family Systems  Summary: Tracey Perry is a 24 y.o. female who returns to [facility] to continue addressing depressive & anxious symptoms.  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   Her father became engaged to long term girlfriend and Kiaria is excited about this as she has a positive relationship with this woman.  Family stressors affecting her maternal grand-parents and Tracey Perry's mother have resulted in client experiencing a great deal of anger.  "I've had anger for days.  I'm staying uninvolved."  Tracey Perry admits "I'm trying to compartmentalize this."  Examples of how she is managing negative thoughts and keeping self from acting out this anger were discussed.  Healthy display of boundaries was discussed.  Client voiced that she experiences anger when she is depressed and "I don't like it because it makes me feel out of control." There is a self admitted discomfort with anger and how she manages anger but at this time Tracey Perry shared various activities that she is doing to distract herself from this situation.  She is grouped with people from other parts of the country and [place] to complete a scavenger hunt which has been a good distraction combined with another outlet for socializing.  In terms of how she has been experiencing negative emotions, she informed LCSW:  "I'm trying to regulate it and let it out a little bit at a time."    Denied recent SIB thoughts or behaviors  and maintains contact with female friend.  Tracey Perry continues enjoying her new car despite the monthly car note and denied new financial, inter-personal or other stressors.    Tracey Perry indicated that she would like the focus for future sessions per client to be discuss precipitating events and situations that increase client's anger.  She was receptive to information shared both verbally & in writing during session.  Suicidal/Homicidal: Nowithout intent/plan  Therapist Response:   Tracey Perry on her progress and reinforced her internal strengths and the differences in her past stressors when she first presented to therapy versus now.  Congratulated her on increasing satisfaction in her life with use of various coping strategies discussed and cognitive re-framing. Processed with her the insights and connections that she has come to recognize in terms of bipolar symptoms.  Reinforced her ongoing use of technology via apps to monitor moods and identify triggers/causes.  Encouraged ongoing expression of needs, concerns, feelings, symptoms and any distressing thoughts that she finds self still struggling with.  Validated feelings associated with current family conflict and praised her efforts around setting/maintaining healthy boundary setting.   Plan: Return again in three weeks or PRN.  Tracey Perry will continue to use coping skills and psycho-educational materials provided to maintain stability in moods and behaviors.  She will keep all appointments and report any adverse affects of medications to her MD.  Diagnosis: BiPolar Disorder, Most Recent Episode, Depressed, Moderate      Generalized Anxiety Disorder   Tracey Dibble, LCSW 06/21/2015

## 2015-06-21 NOTE — Progress Notes (Signed)
Psychiatric Initial Adult Assessment   Patient Identification: Tracey Perry MRN:  [id number] Date of Evaluation:  06/21/2015 Referral Source: Miguel Dibble LCSW Chief Complaint:  "When I was 4 and was told I was depressed." Chief Complaint    Establish Care; Anxiety; Depression; Fatigue; Panic Attack; Stress     Visit Diagnosis: No diagnosis found. Diagnosis:   Patient Active Problem List   Diagnosis Date Noted  . Anxiety, generalized [F41.1] 06/21/2015  . H/O urinary tract infection [Z87.440] 06/21/2015  . 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 H/O: obesity [Z87.898] 06/21/2015  . Affective disorder [F39] 06/21/2015  . History of migraine headaches [Z86.69] 06/21/2015  . Depression, major, recurrent, moderate [F33.1] 06/21/2015  . Depression, major, recurrent, in remission [F33.40] 06/21/2015  . Irritable bowel syndrome [K58.9] 06/21/2015  . Gastroparesis [K31.84] 06/21/2015  . Hx of cold sores [Z86.19] 06/21/2015  . Bipolar affective disorder [F31.9] 05/31/2015  . Generalized anxiety disorder [F41.1] 05/31/2015  . History of pyelonephritis [Z87.440] 05/30/2015  . Dysmenorrhea [N94.6] 05/30/2015  . History of asthma [Z87.09] 05/30/2015  . Migraine without aura and responsive to treatment [G43.009] 05/30/2015  . Extreme obesity [E66.01] 05/30/2015  . 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 Allergic rhinitis, seasonal [J30.2] 05/30/2015  . Vertigo [R42] 05/30/2015  . Anxiety [F41.9] 05/09/2015  . Depression (emotion) [F32.9] 09/20/2013  . IBS (irritable bowel syndrome) [K58.9] 09/20/2013  . H/O cervical spine surgery [Z98.89] 12/29/2012  . Vitamin D deficiency [E55.9] 11/23/2009  . History of mononucleosis [Z86.19] 04/03/2009  . Herpes [B00.9] 11/16/2008   History of Present Illness:  Patient states that she began experiencing depression in her teenage years. She states she first started taking medications at age 24 but saw a therapist when  she was 24. She states she first saw a psychiatrist around age 24. States prior to that her general internist was prescribing her medications. She states that her depression symptoms consist of hypersomnia, anhedonia, low energy, poor concentration, crying fits in mood swings with irritability. When asked about her mood swings she denied having any type of hypomania or mania. She stated typically her depressed Chin can last 1 year but is often in did or precipitated by a life event (i.e. breakup) or medication change.  He denies any psychotic symptoms in the past or presently. In regards to anxiety she states that she describes it more like a "dull buzz" in terms of its intensity. She states she typically is always worried about money but other thing she worries about very. She estimates her anxiety is at a 2 or 3 out of 10. She does describe occasional panic attacks in which she hyperventilates, shuts down, stares into space and feels like her thoughts are racing. She states that typically last 5-10 minutes. She states that they occur right now about every 2 weeks. However she states that prior to being on the Saphris she relates they might have occurred every 6 months.  She has now been on the viibryd and Wellbutrin for one year and finds these are good for her. She relates the Saphris was started 1 month ago and has helped her with self injurious thoughts but she notes it may have increased her panic attacks in terms of frequency. She relates they now occur about every 2 weeks versus before Saphris there were occurring every 6 months.  Elements:  Duration:  As noted above. Associated Signs/Symptoms: Depression Symptoms:  depressed mood, anhedonia, hypersomnia, fatigue, difficulty concentrating, suicidal thoughts without plan, anxiety, loss of  energy/fatigue, increased appetite, (Hypo) Manic Symptoms:  She denied symptoms of mania or hypomania Anxiety Symptoms:  She states her worries fair really  but typically can involve money Psychotic Symptoms:  Denies any psychotic symptoms in the past or presently PTSD Symptoms: Negative Patient has one hospitalization at [facility] at the end of 2014 for suicidal ideation and depression. She denies any other psychiatric hospitalizations. She denies any suicide attempts. She does state that she's had issues with some self-injurious behavior such as snapping a rubber band on her self, scratching or starving herself as punishment. She states she has not engaged in this for over a year and that when she was engaging in it would occur once per month. She saw therapist is age 24 and began medications at age 24 by her general practitioner and then 2 years after that started seeing a psychiatrist for medications. Past Medical History:  Past Medical History  Diagnosis Date  . Herpes simplex without complication   . Vitamin D deficiency   . Morbid obesity due to excess calories   . Depression   . Allergy   . PMDD (premenstrual dysphoric disorder)   . Vertigo   . Gastroparesis   . Anxiety     Past Surgical History  Procedure Laterality Date  . Cholecystectomy    . Lumbar disc surgery    . Tonsillectomy    . Adenoidectomy    . Tympanostomy tube placement    . Appendectomy     Family History:  Family History  Problem Relation Age of Onset  . Depression Mother   . Hypertension Mother     controlled  . Allergic rhinitis Mother   . Anxiety disorder Mother   . Diabetes Father     controlled  . Hypertension Father   . Allergic rhinitis Father   . Depression Father   . Depression Brother   . Anxiety disorder Brother    Social History:   History   Social History  . Marital Status: Single    Spouse Name: N/A  . Number of Children: N/A  . Years of Education: N/A   Social History Main Topics  . Smoking status: Never Smoker   . Smokeless tobacco: Never Used  . Alcohol Use: 0.0 oz/week    0 Standard drinks or equivalent per week     Comment:  Occasionally  . Drug Use: No  . Sexual Activity: No   Other Topics Concern  . None   Social History Narrative   Additional Social History: Patient states that her childhood at home was great. States school was difficult as she was teased. She states she grew up in the home with her younger brother and her mother and father. However she states her parents divorced when she was 61. She states she went away to boarding school at age 55. She states she did well in school. 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 She has never been married has no children. She currently is working in an Cytogeneticist.  She states a typical day is that she gets up in the morning feeds her cats, goes to work. She works Marine scientist. She'll work from 10:30 AM to 7 PM. She states she'll come home watching TV. She states she may tech some friends or go over to [employer]. She states she'll come home and go to bed.  Musculoskeletal: Strength & Muscle Tone: within normal limits Gait & Station: normal Patient leans: N/A  Psychiatric Specialty Exam: HPI  Review of Systems  Psychiatric/Behavioral: Negative for depression, suicidal ideas, hallucinations, memory loss and substance abuse. The patient is nervous/anxious (patient will panic attacks as noted above). The patient does not have insomnia.     Blood pressure 124/82, pulse 90, temperature 97.6 F (36.4 C), temperature source Tympanic, height 5\' 5"  (1.651 m), weight 277 lb 6.4 oz (125.828 kg), last menstrual period 06/21/2015, SpO2 98 %.Body mass index is 46.16 kg/(m^2).  General Appearance: Well Groomed  Eye Contact:  Good  Speech:  Normal Rate  Volume:  Normal  Mood:  Good  Affect:  Congruent  Thought Process:  Linear and Logical  Orientation:  Full (Time, Place, and Person)  Thought Content:  Negative  Suicidal Thoughts:  No  Homicidal Thoughts:  No  Memory:  Immediate;   Good Recent;   Good Remote;   Good  Judgement:   Good  Insight:  Good  Psychomotor Activity:  Negative  Concentration:  Good  Recall:  Good  Fund of Knowledge:Good  Language: Good  Akathisia:  Negative  Handed:  Right unknown   AIMS (if indicated): done today, normal  Assets:  Communication Skills Desire for Improvement Vocational/Educational  ADL's:  Intact  Cognition: WNL  Sleep:  good   Is the patient at risk to self?  No. Has the patient been a risk to self in the past 6 months?  No. Has the patient been a risk to self within the distant past?  No. Is the patient a risk to others?  No. Has the patient been a risk to others in the past 6 months?  No. Has the patient been a risk to others within the distant past?  No.  Allergies:   Allergies  Allergen Reactions  . Nutritional Supplements Itching and Swelling    ONLY certain "FRESH FRUITS" cause throat itches & in spring, throat feels like it will swell.  . Prednisone Other (See Comments) and Palpitations    Severe mood swings and tachycardia   Current Medications: Current Outpatient Prescriptions  Medication Sig Dispense Refill  . albuterol (PROAIR HFA) 108 (90 BASE) MCG/ACT inhaler Inhale into the lungs.    Marland Kitchen asenapine (SAPHRIS) 5 MG SUBL 24 hr tablet Place 5 mg under the tongue 1 day or 1 dose.    Marland Kitchen buPROPion (WELLBUTRIN XL) 300 MG 24 hr tablet Take 300 mg by mouth daily.  2  . cetirizine (ZYRTEC) 10 MG tablet Take by mouth.    . fluticasone (FLONASE) 50 MCG/ACT nasal spray 2 sprays by Each Nare route daily.    Marland Kitchen levonorgestrel (MIRENA, 52 MG,) 20 MCG/24HR IUD by Intrauterine route.    . loratadine (CLARITIN) 10 MG tablet Take by mouth.    Marland Kitchen LORazepam (ATIVAN) 0.5 MG tablet Take 1 tablet (0.5 mg total) by mouth daily as needed for anxiety. 30 tablet 1  . ondansetron (ZOFRAN) 4 MG tablet Take 1 tablet (4 mg total) by mouth every 8 (eight) hours as needed for nausea or vomiting. 20 tablet 0  . valACYclovir (VALTREX) 1000 MG tablet Take 1,000 mg by mouth.    Marland Kitchen VIIBRYD  40 MG TABS Take 40 mg by mouth daily.  2   No current facility-administered medications for this visit.    Previous Psychotropic Medications: Yes  Patient has not had trials of Lamictal, Tegretol, Effexor, Paxil or Zoloft.  She states she had a trial of Cymbalta but it caused her nausea. She states Prozac work for 1 year and  then she experienced a breakup and then it did not work. She states Pristiq caused sexual side effects. She gets Lexapro was okay but she went off of it due to insurance coverage issues. She states Abilify worsened her mood swings and she went off of it because of insurance coverage issues. She states Depakote made her feel flat. She states Seroquel made her feel drowsy. Substance Abuse History in the last 12 months:  No. patient indicates that in her senior in college she drank heavily on the weekends and states it might of been the equivalent of a bottle of vodka every 2 weeks. She endorses blackouts. She denies drinking during the week or drinking in the morning. She states she has not drinking at that level since then. She denies any use of any illicit drugs in the past or presently.  Consequences of Substance Abuse: NA  Medical Decision Making:  Established Problem, Stable/Improving (1), Review of Medication Regimen & Side Effects (2) and Review of New Medication or Change in Dosage (2)  Treatment Plan Summary: Medication management and Plan The patient's depression has been stable on her current medication regimen. Thus we will continue her Wellbutrin XL 300 mg daily, we'll continue her vibrated at 40 mg daily. We will increase her Saphris from 5 mg at bedtime to 10 mg at bedtime to address for any mood stabilization. Patient will continue her Ativan 0.5 mg daily as needed for anxiety. Will follow up in 1 month. She's been encouraged call any questions or concerns prior to her next appointment.   In regards to risk assessment the patient has risk factors of race,  affective illness and age. She has protective factors of gender, forward thinking, engage in treatment, no substance use issues and no past suicide attempts. At this time low risk of imminent harm to herself or others.    Faith Rogue 8/3/20169:52 AM

## 2015-07-04 ENCOUNTER — Ambulatory Visit (INDEPENDENT_AMBULATORY_CARE_PROVIDER_SITE_OTHER): Payer: Commercial Managed Care - PPO | Admitting: Licensed Clinical Social Worker

## 2015-07-04 DIAGNOSIS — F3130 Bipolar disorder, current episode depressed, mild or moderate severity, unspecified: Secondary | ICD-10-CM

## 2015-07-04 DIAGNOSIS — F489 Nonpsychotic mental disorder, unspecified: Secondary | ICD-10-CM

## 2015-07-04 DIAGNOSIS — F411 Generalized anxiety disorder: Secondary | ICD-10-CM | POA: Diagnosis not present

## 2015-07-04 DIAGNOSIS — Z7289 Other problems related to lifestyle: Secondary | ICD-10-CM

## 2015-07-04 NOTE — Progress Notes (Signed)
THERAPIST PROGRESS NOTE  Session Time: 10:10 a.m.  -11:10 a.m.  Participation Level: Active  Behavioral Response: NeatAlertAngry, Anxious and Depressed  Type of Therapy: Individual Therapy  Treatment Goals addressed: Anger, Anxiety and Coping  Interventions: CBT, Motivational Interviewing, Solution Focused, Strength-based, Supportive and Reframing  Summary: Tracey Perry is a 24 y.o. female who presents with worsening symptoms of depression and anxiety and a return of SIB.  She cut herself a week ago with a steak knife on her lower left forearm.  The cuts appeared superficial yet she reported that these are healing. "The Saphris isn't working. The Depakote did better, as flat as it made me feel."  She liked how the Saphris essentially took away her SIB thoughts.  "I'm angry at myself. I was doing so good and hadn't done anything in a year."  Recent trigger per client was that the day of the SIB she reported all in all having small things happening that she described as nusiance then when cleaning out her old care she found the engagement ring catalog and old hotel receipt from relationship with former boyfriend, Gershon Mussel.  He has been married for four years to the young woman whom he started dating after he broke up with [name]. "I thought I had dealt with that."  "I've had three melt downs at work."  Experiencing crying spells "out of the blue," worse anxiety; hypersomnia; overall feeling more depressed; appetite is fair and did indicate that her increase in walking using a fit bit may be helping her mood some.  She is still going to work but did not mention other activities besides watching Netflix.  "It's not his fault it's mine."  "I'm pissed about the way and how it ended. A text, really?"  She was able to re-frame the situation as she talked through it and voice her sadness and disappointment.  "I knew we were eventually going to break up."  Aerabella was able to identify her feelings  which include anger; sadness and abandonment.  She was very tearful as the session focused on this. Client with good insight that her thinking pattern around this issue is distorted as she talked about feeling that she was unlovable.  "I did check off 13 of the 17 co-dependent characteristics on the sheet that you gave me."  She is in agreement that future sessions focus on the topics of abandonment and co-dependency.  She adamantly denied present SIB and is hopeful that her recurring symptoms are more related to the ineffectiveness of the medication versus needing to deal with deeper and unresolved feelings associated with above discussed relationship. "It has ruined me of wanting to get married or have children. We were so young. I think I felt much deeper about [place] than he did."  Goal for future sessions agreed upon will focus on co-dependency and abandonment issues.  Suicidal/Homicidal: Negativewithout intent/plan  Therapist Response:     Emotional and social support were provided to client along with emphasis on grief and loss and how this can be delayed and/or prolonged for some people.  Gently re-framed various thinking errors and empowered Chandler to continue use of previously used hand-outs and educational materials to further develop increasing coping skills to deal with emotion and distress tolerance. Explored with her possible unresolved key issues from her first love relationship and through various socratic style questions, LCSW assisted client to draw conclusions about what she has learned about herself.  Cautioned against too much internalization and taking responsibility for  things that she has no control over and therefore not responsible for.   Encouraged ongoing expression of needs, concerns, feelings, symptoms and any distressing thoughts that she finds self still struggling with.  Informed client that LCSW will discuss symptom presentation with Dr. Jimmye Norman.  Plan: Return again in  2-4 weeks or PRN.  Aariana will continue to use coping skills and psycho-educational materials provided to maintain stability in moods and behaviors.  She will keep all appointments and report any adverse affects of medications to her MD.  Diagnosis: BiPolar Disorder, Most Recent Episode, Depressed, Moderate      Generalized Anxiety Disorder   Self-Mutilation   Miguel Dibble, LCSW 07/04/2015

## 2015-07-06 ENCOUNTER — Telehealth: Payer: Self-pay

## 2015-07-06 NOTE — Telephone Encounter (Signed)
pt left message for a return call she having issues with medications

## 2015-07-06 NOTE — Telephone Encounter (Signed)
left message to call our office back.  

## 2015-07-07 ENCOUNTER — Telehealth: Payer: Self-pay | Admitting: Psychiatry

## 2015-07-07 MED ORDER — ASENAPINE MALEATE 5 MG SL SUBL: SUBLINGUAL_TABLET | SUBLINGUAL | Status: DC

## 2015-07-07 NOTE — Telephone Encounter (Signed)
pt called back. pt states that her medication not working she is having crying , mood swings, cutting herself again. dr. Jimmye Norman wanted call transfered to him

## 2015-07-07 NOTE — Telephone Encounter (Signed)
Spoke with patient. We have made a medication adjustment and schedule an earlier appointment. I've also discussed the case with the patient's therapist. AW

## 2015-07-07 NOTE — Telephone Encounter (Signed)
Patient contacted the clinic and expressed that she felt her medications were not working as well. She states that she cut herself once. She denies any suicidal ideation but states that it occurred without any particular stressor. As we discussed in her last appointment she felt her medication regimen was effective. She felt the Saphris was helpful but she feels like it is now fizzled out in terms of keeping her somewhat stable. She'll get her work schedule is from 10:30 to about 7 PM. I discussed that I would like to increase her Saphris however she stated that when she takes it during the daytime she will be too tired as it is caused her some sedation. She indicated that she cut herself on her day off and it was about 8 PM. I have instructed her that we will increase her Saphris and she will take a 5 mg dose when she is done from work at around 7 PM and continue her 10 mg dose at bedtime. We will have her have an earlier appointment next week on  July 12, 2015. Will order Saphris 5 mg tablets with instructions to take one in the evening and 2 at bedtime. Patient is encouraged call any questions or concerns prior to her next appointment. AW

## 2015-07-12 ENCOUNTER — Encounter: Payer: Self-pay | Admitting: Psychiatry

## 2015-07-12 ENCOUNTER — Ambulatory Visit (INDEPENDENT_AMBULATORY_CARE_PROVIDER_SITE_OTHER): Payer: Commercial Managed Care - PPO | Admitting: Psychiatry

## 2015-07-12 VITALS — BP 102/64 | HR 96 | Temp 98.6°F | Ht 65.0 in | Wt 277.4 lb

## 2015-07-12 DIAGNOSIS — F332 Major depressive disorder, recurrent severe without psychotic features: Secondary | ICD-10-CM

## 2015-07-12 MED ORDER — LAMOTRIGINE 25 MG PO TBDP
ORAL_TABLET | ORAL | Status: DC
Start: 2015-07-12 — End: 2015-07-25

## 2015-07-12 NOTE — Progress Notes (Addendum)
BH MD/PA/NP OP Progress Note  07/12/2015 3:15 PM CARRIANN HESSE  MRN:  [id number]  Subjective:  Patient returns for follow-up of her major depressive disorder, severe recurrent and anxiety. She presents for earlier scheduled appointment because she engage in some cutting 2 weeks ago and was worried. She initially indicated there were no triggers for this behavior as it something that she had not done for several months. However I discussed the issue with her therapist and the therapist actually did state the patient had a trigger of finding an item that reminded her of her past romantic relationship. I reminded patient of this she stated yes and agreed that that was likely the major trigger. We spoke on the phone a few days ago and we increased her Saphris slightly to 5 mg in the evening and then 10 mg at bedtime however she continues to have sedation with Saphris. She states the Saphris helped her a lot with self injurious thoughts but then she states it stopped working. She denies feeling any urges to cut right now. I did spend time discussing coping techniques such as DBT, distraction and deep breathing. Patient states she does do these with some effect.  However we also discussed that since Saphris is now causing sedation and perhaps not providing any benefit that perhaps we need to look at medications that she could take her in the day and evening and would not cause such sedation. Patient was agreeable with this plan. We spent some time discussing Lamictal. She has been on multiple mood stabilizers in the past without benefit with the exceptions of probably Lamictal, Tegretol. Chief Complaint:  Chief Complaint    Follow-up; Medication Refill; Anxiety; Depression; Panic Attack; Fatigue; Other     Visit Diagnosis:  No diagnosis found.  Past Medical History:  Past Medical History  Diagnosis Date  . Herpes simplex without complication   . Vitamin D deficiency   . Morbid obesity due to  excess calories   . Depression   . Allergy   . PMDD (premenstrual dysphoric disorder)   . Vertigo   . Gastroparesis   . Anxiety     Past Surgical History  Procedure Laterality Date  . Cholecystectomy    . Lumbar disc surgery    . Tonsillectomy    . Adenoidectomy    . Tympanostomy tube placement    . Appendectomy     Family History:  Family History  Problem Relation Age of Onset  . Depression Mother   . Hypertension Mother     controlled  . Allergic rhinitis Mother   . Anxiety disorder Mother   . Diabetes Father     controlled  . Hypertension Father   . Allergic rhinitis Father   . Depression Father   . Depression Brother   . Anxiety disorder Brother    Social History:  Social History   Social History  . Marital Status: Single    Spouse Name: N/A  . Number of Children: N/A  . Years of Education: N/A   Social History Main Topics  . Smoking status: Never Smoker   . Smokeless tobacco: Never Used  . Alcohol Use: 0.0 oz/week    0 Standard drinks or equivalent per week     Comment: Occasionally  . Drug Use: No  . Sexual Activity: No   Other Topics Concern  . None   Social History Narrative   Additional History:   Assessment:   Musculoskeletal: Strength & Muscle Tone: within normal limits  Gait & Station: normal Patient leans: N/A  Psychiatric Specialty Exam: HPI  ROS  Blood pressure 102/64, pulse 96, temperature 98.6 F (37 C), temperature source Tympanic, height 5\' 5"  (1.651 m), weight 277 lb 6.4 oz (125.828 kg), last menstrual period 06/21/2015, SpO2 98 %.Body mass index is 46.16 kg/(m^2).  General Appearance: Well Groomed  Eye Contact:  Good  Speech:  Normal Rate  Volume:  Normal  Mood:  Okay  Affect:  She was bright initially than when I reminded her of the possible trigger she was slightly tearful for a few minutes.  Thought Process:  Linear and Logical  Orientation:  Full (Time, Place, and Person)  Thought Content:  Negative  Suicidal  Thoughts:  No  Homicidal Thoughts:  No  Memory:  Immediate;   Good Recent;   Good Remote;   Good  Judgement:  Good  Insight:  Good  Psychomotor Activity:  Negative  Concentration:  Good  Recall:  Good  Fund of Knowledge: Good  Language: Good  Akathisia:  Negative  Handed:  Right unknown  AIMS (if indicated):  Not done today  Assets:  Communication Skills Desire for Improvement Vocational/Educational  ADL's:  Intact  Cognition: WNL  Sleep:  good   Is the patient at risk to self?  No. SIB related to emtions, No SI Has the patient been a risk to self in the past 6 months?  Yes.   Has the patient been a risk to self within the distant past?  Yes.   Is the patient a risk to others?  No. Has the patient been a risk to others in the past 6 months?  No. Has the patient been a risk to others within the distant past?  No.  Current Medications: Current Outpatient Prescriptions  Medication Sig Dispense Refill  . albuterol (PROAIR HFA) 108 (90 BASE) MCG/ACT inhaler Inhale into the lungs.    Marland Kitchen asenapine (SAPHRIS) 5 MG SUBL 24 hr tablet Take 1 tablet under the tongue in the evening and 2 tablets under the tongue at bedtime 90 tablet 0  . buPROPion (WELLBUTRIN XL) 300 MG 24 hr tablet Take 1 tablet (300 mg total) by mouth daily. 30 tablet 1  . cetirizine (ZYRTEC) 10 MG tablet Take by mouth.    . fluticasone (FLONASE) 50 MCG/ACT nasal spray 2 sprays by Each Nare route daily.    Marland Kitchen levonorgestrel (MIRENA, 52 MG,) 20 MCG/24HR IUD by Intrauterine route.    . loratadine (CLARITIN) 10 MG tablet Take by mouth.    Marland Kitchen LORazepam (ATIVAN) 0.5 MG tablet Take 1 tablet (0.5 mg total) by mouth daily as needed for anxiety. 30 tablet 1  . ondansetron (ZOFRAN) 4 MG tablet Take 1 tablet (4 mg total) by mouth every 8 (eight) hours as needed for nausea or vomiting. 20 tablet 0  . valACYclovir (VALTREX) 1000 MG tablet Take 1,000 mg by mouth.    Marland Kitchen VIIBRYD 40 MG TABS Take 1 tablet (40 mg total) by mouth daily. 30  tablet 1  . Asenapine Maleate (SAPHRIS) 10 MG SUBL Place 1 tablet (10 mg total) under the tongue at bedtime. (Patient not taking: Reported on 07/12/2015) 30 tablet 1  . lamotrigine (LAMICTAL ODT) 25 MG disintegrating tablet Take one tablet in the morning for seven days and then increase to two tablets in the morning. 60 tablet 1   No current facility-administered medications for this visit.    Medical Decision Making:  Established Problem, Worsening (2), Review of Medication Regimen &  Side Effects (2) and Review of New Medication or Change in Dosage (2)  Treatment Plan Summary:Medication management and Plan Given the trigger the recent cutting occurring 2 weeks ago may likely be a part of a personality disorder or or coping skills. We will continue to try to address this in our sessions as well as in her therapy. It does not appear that it is part of a major depressive, or manic episode. We will continue her Saphris at 10 mg at bedtime with an ago and discontinue the 5 mg in the evening as she states it was putting her immediately to sleep. She'll continue her Ativan, Wellbutrin and Viibryd  as previously prescribed. Asian artery has follow-up with me in 2 weeks. Encouraged colony questions concerns at that time.   We will start Lamictal 25 mg in the morning for 7 days and then she'll increase to 50 mg in the morning. We may work towards discontinuing the Saphris over time.  1600: Patient contacted the clinic on 07/13/2015 she indicated she still feeling very emotional. She states she denies any suicidal thoughts or plans. However her therapist spoke with today and I have been updated as to possibly refer to IOP or inpatient. Patient states she is thinking more that inpatient might be necessary. I told her she can go to her nearest emergency room. However she states she would probably prefer to go to [facility] because of cost related issues. She stated at this point she thinks she'll just wait till her  appointment with her therapist tomorrow and make a determination then. She stated her mother is with her at this time.  Faith Rogue 07/12/2015, 3:15 PM

## 2015-07-13 ENCOUNTER — Telehealth: Payer: Self-pay | Admitting: Psychiatry

## 2015-07-13 NOTE — Telephone Encounter (Signed)
Case was discussed with the patient's therapist. Therapist will see patient tomorrow morning and we will assess patient's complaints. Therapist did speak with patient today and indicated there is no suicidal ideation but rather the issues with mood and thoughts about cutting. We have discussed referring to IOP but we'll make further assessment tomorrow. AW

## 2015-07-14 ENCOUNTER — Telehealth: Payer: Self-pay | Admitting: Licensed Clinical Social Worker

## 2015-07-14 ENCOUNTER — Ambulatory Visit (INDEPENDENT_AMBULATORY_CARE_PROVIDER_SITE_OTHER): Payer: Commercial Managed Care - PPO | Admitting: Licensed Clinical Social Worker

## 2015-07-14 DIAGNOSIS — F3130 Bipolar disorder, current episode depressed, mild or moderate severity, unspecified: Secondary | ICD-10-CM

## 2015-07-14 DIAGNOSIS — F489 Nonpsychotic mental disorder, unspecified: Secondary | ICD-10-CM

## 2015-07-14 DIAGNOSIS — F411 Generalized anxiety disorder: Secondary | ICD-10-CM | POA: Diagnosis not present

## 2015-07-14 DIAGNOSIS — Z7289 Other problems related to lifestyle: Secondary | ICD-10-CM

## 2015-07-14 NOTE — Progress Notes (Signed)
THERAPIST PROGRESS NOTE  Session Time: 8:10 a.m. -  9:10 a.m.  Participation Level: Active  Behavioral Response: CasualAlertAnxious, Depressed and Dysphoric  Type of Therapy: Individual Therapy  Treatment Goals addressed: Anxiety and Coping  Interventions: CBT, Solution Focused, Strength-based, Supportive and Reframing  Summary: Tracey Perry is a 24 y.o. female who presents with worsening depressive and anxious symptoms as evidenced by return of crying spells, reported fear of failure, panic attacks and increasing calls to this clinic about feeling unstable. She informed LCSW, in terms of treatment recommendations that include [facility], client stated: "If I go to [facility] it[facility] free."  In response to LCSW's questions around reasons for seeking inpatient admission she stated: "I don't know that it's as bad as it was before.  I'm not actively suicidal and even the self harm urges are not as bad."  Sense of failure was voiced if she is admitted to inpatient facility. "It means I failed. I can't cope with my life."  "I'm emotionally and mentally exhausted."  The primary cause of this relapse per client is her fear of failure and various triggers or activating events were identified during the session:  Her lab top computer broke, cat/vet bills are adding up and she is anxious about asking parents to borrow money, losing title to her old vehicle twice and being fussed at by her father and general feelings of helplessness and self-defeating beliefs which client has fair amount of insight into.  Her focus for session was determining her treatment needs at this time by asking LCSW  "Should I go inpatient?"  "I just have a job where I have to keep a smile on my face. I feel like I need a little time to calm down and cope." She is hopeful that getting her medications "right" will help her through this.  However, this is a self-reported belief that if she goes into the [facility]  that "I will feel like I failed  While in session, client received a call from [facility] and given appointment date for 07/25/15, at 8:45 a.m. for initial assessment. Tracey Perry also has appointments on that same day with Dr. Jimmye Norman and LCSW.  She will call to re-schedule these appointments.  Biggest concern per client regarding participation in the IOP relates to costs.  There are various ruminating thoughts that client admits to becoming involved with and the theme is primarily around failure, self-criticism and feelings of rejection and abandonment.  At conclusion of session she remained undecided about whether or not she will go to the inpatient unit at [facility] stating "Well, it's only a 45 minute ride."  Tracey Perry did contract to call either the Mobile Crisis locally or go to the ER of her choice and she has been and will continue to stay at her mother's house.  Suicidal/Homicidal: Negativewithout intent/plan; Tracey Perry does have fleeting SIB ideation but denied these during session today.  Last SIB ideation reported was yesterday and last actual SIB/cutting was at least two weeks ago.  Therapist Response:     Emotional and social support were provided to client and discussed common factors that contribute to relapse in depression.  Provided client with written materials that discussed strategies for preventing relapse and telephone number to 24/7 Mobile Crisis unit. Over view of common admission criteria for inpatient hospitalization and reinforced with client that LCSW could not guarantee an admission to any hospital yet based on presenting symptoms, frequency and duration, clinically this  LCSW would encourage an inpatient stay.  Discussed importance of higher levels of care, including the IOP when OPT and medication management alone are not adequate to prevent further worsening of emotional and behavioral symptoms.  Normalized concerns about finances.  Wrote client a generic letter for her employer  to keep her out of work for at least one week until she returns to see LCSW.  Plan: Return again in one week. She will access Mobile Crisis and/or go to the ER of her choice for further evaluation.   Tracey Perry will continue to use coping skills and psycho-educational materials provided to maintain stability in moods and behaviors.  She will keep all appointments and report any adverse affects of medications to her MD.  Diagnosis: BiPolar Disorder, Most Recent Episode, Depressed, Moderate      Generalized Anxiety Disorder   Self-Mutilation (Rule-Out Borderline Personality Disorder)   Miguel Dibble, LCSW 07/14/2015

## 2015-07-14 NOTE — Telephone Encounter (Signed)
Support to client and client's mother and assessed immediate risks of harm to self or others. Discussed additional treatment options and scheduled client for therapy session on 07/14/15, 8:00 a.m. Both in agreement with information provided.  Client with relapse of depressive symptoms and SIB.  Client denied both SI and SIB.  She is staying with her mother.  LCSW spoke with client's mother upon Tracey Perry's verbal consent and assured her that she was handling the situation appropriately by supervising daughter and reassuring her.  Provided telephone number to Mobile Crisis in event client becomes more symptomatic and her mother is concerned about managing client's behaviors. Discussed [facility] IOP as an option and client wrote down information and agreed to call to schedule assessment appointment. Tracey Perry accepted LCSW's appointment and denied additional needs.  Appreciation voiced and understanding of services was expressed by Tracey Perry and her mother, Tracey Perry.  PLAN:  Session with client 07/14/15, and referrals PRN.

## 2015-07-19 ENCOUNTER — Ambulatory Visit (INDEPENDENT_AMBULATORY_CARE_PROVIDER_SITE_OTHER): Payer: Commercial Managed Care - PPO | Admitting: Licensed Clinical Social Worker

## 2015-07-19 DIAGNOSIS — F411 Generalized anxiety disorder: Secondary | ICD-10-CM

## 2015-07-19 DIAGNOSIS — F489 Nonpsychotic mental disorder, unspecified: Secondary | ICD-10-CM

## 2015-07-19 DIAGNOSIS — Z7289 Other problems related to lifestyle: Secondary | ICD-10-CM

## 2015-07-19 DIAGNOSIS — F3130 Bipolar disorder, current episode depressed, mild or moderate severity, unspecified: Secondary | ICD-10-CM

## 2015-07-19 NOTE — Progress Notes (Signed)
THERAPIST PROGRESS NOTE  Session Time: 10:03 a.m. - 11:15 a.m.  Participation Level: Active  Behavioral Response: CasualAlertAngry, Anxious and Depressed  (Symptoms are moderate during session and per recent history)  Type of Therapy: Individual Therapy  Treatment Goals addressed: Anxiety and Coping  Interventions: CBT, Solution Focused, Supportive, Family Systems and Reframing  Summary: Tracey Perry is a 24 y.o. female who returns to OPT to address symptoms of depression, anxiety and anger which seem to be improving per her report with addition of Lamictal.  "Sunday I was okay but then Monday it was not good."  Due to cat's illness and vet bills, she had a panic attack late Monday evening while staying with her mother. She attended [facility] on Sunday and felt anxious because of the crowd at [place] yet got through this.  Since beginning Lamictal she denied having tearful episodes.  Described self as ". . . Putting myself back together."  Client did not go into the hospital and she continued to stay with her mother.  Biggest stressor was that one of her cat's was very sick over the week-end. "I went to work Monday and Tuesday. It wasn't my best days but I need the money."  She reported "It was actually one of my best sales days." Described that boss seemed understanding and a co-worker is supportive of her. "Yesterday was not as bad. I knew I could do it." Client will work the rest of the week.   "My temper comes with the anxiety."  Tracey Perry talked about feeling irritable and not feeling that she has patience like she did. Distorted belief that she identified per family of origin, primarily grand-mothers includes: "Good girls aren't suppose to get angry."  Tracey Perry also voiced having a difficult time not seeing herself as a failure when it comes to taking the Ativan PRN. By end of session client demonstrated different thoughts/beliefs and reframed original negative automatic thoughts.   These new thoughts include: "All human beings get angry sometimes." "It's not good or bad to be angry." She further discussed how now she sees taking the Ativan as a tool to help her use other coping skills/tools and that it is for her emotional health and well-being and not because she is flawed or lacks something.  Tracey Perry has an evaluation appointment next Tuesday with [facility] IOP and she follows up with Dr. Jimmye Norman that day.  At this time she appears to be interested in attending IOP and has notified her boss that her work schedule may need to change for at least a few weeks.  Other disappointment discussed is her father's response to her request to meet and talk to him.  "He told me he couldn't because he had a house to go look at." On a positive note, she and father have a standing date night on Saturdays that he rarely cancels per client.  Tracey Perry was receptive to information offered by LCSW and denied other concerns or needs during session.  Suicidal/Homicidal: Negativewithout intent/plan;  No SIB  Therapist Response:     Emotional and social support were provided to client and discussed current cognitive distortions that seem to increase feelings of shame, anxiety, guilt and fear.  Psycho-education about anxiety like depression as having a chemical component as well as a learned behavior component and that medication is essentially preventative and a means towards an end which is to function better and feel healthier.    Discussed use of technology in the form of apps that  teach relaxation, guided imagery and also redirect negative thoughts through positive affirmations and mindfulness techniques.  Suggested two books that address fear and issues around perfectionism.  Reviewed importance of collecting additional information/facts when she finds self over-whelmed and to acknowledge that few, if any, fears become reality.  Plan: Return again in 1-2 weeks or PRN. Dela to use her judgment  and continue to stay with a family member PRN.   Tracey Perry will continue to use coping skills and psycho-educational materials provided to maintain stability in moods and behaviors.  She will keep all appointments and report any adverse affects of medications to her MD.  Diagnosis: BiPolar Disorder, Most Recent Episode, Depressed, Moderate      Generalized Anxiety Disorder   Self-Mutilation (Rule-Out Borderline Personality Disorder)   Miguel Dibble, LCSW 07/19/2015

## 2015-07-25 ENCOUNTER — Ambulatory Visit: Payer: 59 | Admitting: Psychiatry

## 2015-07-25 ENCOUNTER — Encounter (HOSPITAL_COMMUNITY): Payer: Self-pay | Admitting: Psychiatry

## 2015-07-25 ENCOUNTER — Ambulatory Visit: Payer: Self-pay | Admitting: Licensed Clinical Social Worker

## 2015-07-25 ENCOUNTER — Ambulatory Visit: Payer: Commercial Managed Care - PPO | Admitting: Licensed Clinical Social Worker

## 2015-07-25 ENCOUNTER — Ambulatory Visit (INDEPENDENT_AMBULATORY_CARE_PROVIDER_SITE_OTHER): Payer: 59 | Admitting: Psychiatry

## 2015-07-25 ENCOUNTER — Encounter: Payer: Self-pay | Admitting: Psychiatry

## 2015-07-25 ENCOUNTER — Other Ambulatory Visit (HOSPITAL_COMMUNITY): Payer: 59 | Attending: Psychiatry | Admitting: Psychiatry

## 2015-07-25 VITALS — BP 110/80 | HR 107 | Temp 98.8°F | Ht 65.0 in | Wt 278.2 lb

## 2015-07-25 DIAGNOSIS — E559 Vitamin D deficiency, unspecified: Secondary | ICD-10-CM | POA: Diagnosis not present

## 2015-07-25 DIAGNOSIS — G710 Muscular dystrophy: Secondary | ICD-10-CM | POA: Insufficient documentation

## 2015-07-25 DIAGNOSIS — F3130 Bipolar disorder, current episode depressed, mild or moderate severity, unspecified: Secondary | ICD-10-CM

## 2015-07-25 DIAGNOSIS — F331 Major depressive disorder, recurrent, moderate: Secondary | ICD-10-CM | POA: Insufficient documentation

## 2015-07-25 DIAGNOSIS — F411 Generalized anxiety disorder: Secondary | ICD-10-CM | POA: Diagnosis not present

## 2015-07-25 MED ORDER — ASENAPINE MALEATE 5 MG SL SUBL: SUBLINGUAL_TABLET | SUBLINGUAL | Status: DC

## 2015-07-25 MED ORDER — LAMOTRIGINE 25 MG PO TABS: ORAL_TABLET | ORAL | Status: DC

## 2015-07-25 NOTE — Progress Notes (Signed)
    Daily Group Progress Note  Program: IOP  Group Time: 9:00-10:30  Participation Level: Active  Behavioral Response: Appropriate  Type of Therapy:  Group Therapy  Summary of Progress: Pt. Spent first part of group with case manager and psychiatrist.     Group Time: 10:30-12:00  Participation Level:  Active  Behavioral Response: Appropriate  Type of Therapy: Psycho-education Group  Summary of Progress: Pt. Presented as primarily depressed with fairly flat affect. Pt. Shared appropriately in group about tendency toward self-criticism. Pt. Participated in discussion about dignity of risk and developing attitude of self-compassion towards feelings, thoughts, and behaviors.   Nancie Neas, LPC

## 2015-07-25 NOTE — Progress Notes (Signed)
Tracey Perry is a 24 y.o., single, employed, Caucasian female.  States she has been depressed since she was 17.  She has been treated with several antidepressants and mood disorder meds with some degree of effectiveness. Current meds are generally okay but she starting getting more depressed about 3 weeks ago. Triggers:  1)  Ill cat:  The cat was very sick and there was a $1000 vet bill.  Finances in general are a stressor.  2) Trying to sell her car but not being able to find the title.  3)  Health Issues (IBS):  Reports due to health issues, she has gained 20-30 pounds in the last few months. These things all happened at the same time and she has felt overwhelmed. Was crying on the job for no apparent reason, unable to function and had to call out. She feels depressed, sleeps 14 hrs daily, no energy or appetite, decreased concentration,  no motivation or interest in usual activities. Thoughts of wanting to not live but no suicidal thoughts.  Denies HI or A/V hallucinations. Pt reports one previous inpatient psychiatric admission in [facility] due to [place].  Pt has been seeing Miguel Dibble, LCSW for eighteen months and Dr. Jimmye Norman for one month.  Hx of self-mutilation (cutting).  Pt admits to recently superficially cutting on her left arm.  Denies any past suicide attempts.  Family Hx:  Parents, brother (Depression and Anxiety); Paternal Grandmother (Depression). Childhood:  Born in [place], [place].  States she was a miracle baby; because the doctor had told her mother that she couldn't have children.  Reports an awesome childhood.  Parents divorced when she was age 52.  "That's when I had my first episode of depression."  Pt states she enjoyed school and was a good [employer].  Reports going off to a female boarding school.  "That was the best thing that ever happened to me.  I was third in my class."  Denied any abuse. Sibling:  Younger brother who has an auto-immune disorder. Pt denies any  drugs/ETOH.  Admits being a heavy drinker (ETOH) during her senior year in college.  Denies legal issues, past DUI's, or cigarette use.  Support system includes her parents (especially mother).  Pt resides alone. Pt completed all forms.  Scored 33 on the burns.  Pt will attend [facility] for two weeks.  Pt plans to work each day after the groups.  A:  Oriented pt.  Provided pt with an orientation folder.  Informed Dr. Jimmye Norman and Miguel Dibble, LCSW of admit.  Encouraged support groups.  R:  Pt receptive.        Carlis Abbott, RITA, M.Ed, CNA

## 2015-07-25 NOTE — Progress Notes (Signed)
BH MD/PA/NP OP Progress Note  07/25/2015 3:30 PM Tracey Perry  MRN:  [id number]  Subjective:  Patient returns for follow-up of her major depressive disorder, severe recurrent and anxiety. At the last visit we have been trying to address her feelings and urges for cutting. We initially didn't Lamictal and patient reports benefit in regards to less crying episodes. She states it used to be 3 times a day and since starting the Lamictal it occurs about 3 times per week. She also states that her urges and thoughts to cut are no longer present. She states she is sleeping well and eating well. She continues to go to work. She states her boss is been accommodating for her as she has begun the intensive outpatient program today.  He spent time discussing how she felt about the intensive outpatient program. She states she felt okay and is aware was just the first day today. She does state she feels a little "awkward" in the group settings there. However she states she might be able to get used to it.  He does not notice any side effects from the Lamictal. She denies having any type of rash. She states she is continuing on the other medications vibrated, Wellbutrin, Saphris. She states that she is not noticed any side effects from those medications. She states she is rarely been taking the lorazepam.  Patient did inquire about what her diagnosis was. I did tell her that presently we do have her listed as bipolar disorder. However I did discuss that some of her mood fluctuations in coping mechanisms may be more consistent with personality traits. Patient seemed accepting of this explanation and stated she was just curious.  Chief Complaint:   Visit Diagnosis:     ICD-9-CM ICD-10-CM   1. Bipolar affective disorder, current episode depressed, current episode severity unspecified 296.50 F31.30     Past Medical History:  Past Medical History  Diagnosis Date  . Herpes simplex without complication   .  Vitamin D deficiency   . Morbid obesity due to excess calories   . Depression   . Allergy   . PMDD (premenstrual dysphoric disorder)   . Vertigo   . Gastroparesis   . Anxiety     Past Surgical History  Procedure Laterality Date  . Cholecystectomy    . Lumbar disc surgery    . Tonsillectomy    . Adenoidectomy    . Tympanostomy tube placement    . Appendectomy     Family History:  Family History  Problem Relation Age of Onset  . Depression Mother   . Hypertension Mother     controlled  . Allergic rhinitis Mother   . Anxiety disorder Mother   . Diabetes Father     controlled  . Hypertension Father   . Allergic rhinitis Father   . Depression Father   . Depression Brother   . Anxiety disorder Brother    Social History:  Social History   Social History  . Marital Status: Single    Spouse Name: N/A  . Number of Children: N/A  . Years of Education: N/A   Social History Main Topics  . Smoking status: Never Smoker   . Smokeless tobacco: Never Used  . Alcohol Use: 0.0 oz/week    0 Standard drinks or equivalent per week     Comment: Occasionally  . Drug Use: No  . Sexual Activity: No   Other Topics Concern  . Not on file   Social History  Narrative   Additional History:   Assessment:   Musculoskeletal: Strength & Muscle Tone: within normal limits Gait & Station: normal Patient leans: N/A  Psychiatric Specialty Exam: HPI  Review of Systems  Psychiatric/Behavioral: Negative for depression, suicidal ideas, hallucinations, memory loss and substance abuse. The patient is not nervous/anxious and does not have insomnia.     There were no vitals taken for this visit.There is no weight on file to calculate BMI.  General Appearance: Well Groomed  Eye Contact:  Good  Speech:  Normal Rate  Volume:  Normal  Mood:  better  Affect:  She was bright initially than when I reminded her of the possible trigger she was slightly tearful for a few minutes.  Thought Process:   Linear and Logical  Orientation:  Full (Time, Place, and Person)  Thought Content:  Negative  Suicidal Thoughts:  No  Homicidal Thoughts:  No  Memory:  Immediate;   Good Recent;   Good Remote;   Good  Judgement:  Good  Insight:  Good  Psychomotor Activity:  Negative  Concentration:  Good  Recall:  Good  Fund of Knowledge: Good  Language: Good  Akathisia:  Negative  Handed:  Right unknown  AIMS (if indicated):  Not done today  Assets:  Communication Skills Desire for Improvement Vocational/Educational  ADL's:  Intact  Cognition: WNL  Sleep:  good   Is the patient at risk to self?  No. SIB related to emtions, No SI Has the patient been a risk to self in the past 6 months?  Yes.   Has the patient been a risk to self within the distant past?  Yes.   Is the patient a risk to others?  No. Has the patient been a risk to others in the past 6 months?  No. Has the patient been a risk to others within the distant past?  No.  Current Medications: Current Outpatient Prescriptions  Medication Sig Dispense Refill  . albuterol (PROAIR HFA) 108 (90 BASE) MCG/ACT inhaler Inhale into the lungs.    Marland Kitchen asenapine (SAPHRIS) 5 MG SUBL 24 hr tablet Take 1 tablet under the tongue in the evening and 2 tablets under the tongue at bedtime 90 tablet 0  . Asenapine Maleate (SAPHRIS) 10 MG SUBL Place 1 tablet (10 mg total) under the tongue at bedtime. 30 tablet 1  . buPROPion (WELLBUTRIN XL) 300 MG 24 hr tablet Take 1 tablet (300 mg total) by mouth daily. 30 tablet 1  . cetirizine (ZYRTEC) 10 MG tablet Take by mouth.    . fluticasone (FLONASE) 50 MCG/ACT nasal spray 2 sprays by Each Nare route daily.    Marland Kitchen lamoTRIgine (LAMICTAL) 25 MG tablet Take 2 tablets in the morning for 7 days, then 2 tablets in AM and 1 tablet in PM for 7 days then 2 tablets in morning and 2 tablets in PM 120 tablet 0  . levonorgestrel (MIRENA, 52 MG,) 20 MCG/24HR IUD by Intrauterine route.    . loratadine (CLARITIN) 10 MG tablet Take  by mouth.    Marland Kitchen LORazepam (ATIVAN) 0.5 MG tablet Take 1 tablet (0.5 mg total) by mouth daily as needed for anxiety. 30 tablet 1  . ondansetron (ZOFRAN) 4 MG tablet Take 1 tablet (4 mg total) by mouth every 8 (eight) hours as needed for nausea or vomiting. 20 tablet 0  . valACYclovir (VALTREX) 1000 MG tablet Take 1,000 mg by mouth.    Marland Kitchen VIIBRYD 40 MG TABS Take 1 tablet (40 mg total)  by mouth daily. 30 tablet 1   No current facility-administered medications for this visit.    Medical Decision Making:  Established Problem, Worsening (2), Review of Medication Regimen & Side Effects (2) and Review of New Medication or Change in Dosage (2)  Treatment Plan Summary:Medication management and Plan we'll continue to titrate her Lamictal to a therapeutic dose. Thus she will continue on her 50 mg in the morning which she just started yesterday. In 7 days she will increase to 50 mg in the morning and 25 mg in the evening and then in 7 days she will increase to 50 mg in the morning and 50 mg in the evening. She's been encouraged call any questions or concerns prior to her next appointment. She will continue intensive outpatient program.  She will continue her other medications without any changes: Saphris, Wellbutrin XL, Viibryd and lorazepam.     Faith Rogue 07/25/2015, 3:30 PM

## 2015-07-25 NOTE — Progress Notes (Signed)
Psychiatric Initial Adult Assessment   Patient Identification: Tracey Perry MRN:  [id number] Date of Evaluation:  07/25/2015 Referral Source: therapist Tracey Perry Chief Complaint:depressed   Visit Diagnosis: Major depression, recurrent, moderate Diagnosis:   Patient Active Problem List   Diagnosis Date Noted  . Anxiety, generalized [F41.1] 06/21/2015  . H/O urinary tract infection [Z87.440] 06/21/2015  . 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 H/O: obesity [Z87.898] 06/21/2015  . Affective disorder [F39] 06/21/2015  . History of migraine headaches [Z86.69] 06/21/2015  . Depression, major, recurrent, moderate [F33.1] 06/21/2015  . Depression, major, recurrent, in remission [F33.40] 06/21/2015  . Irritable bowel syndrome [K58.9] 06/21/2015  . Gastroparesis [K31.84] 06/21/2015  . Hx of cold sores [Z86.19] 06/21/2015  . Bipolar affective disorder [F31.9] 05/31/2015  . Generalized anxiety disorder [F41.1] 05/31/2015  . History of pyelonephritis [Z87.440] 05/30/2015  . Dysmenorrhea [N94.6] 05/30/2015  . History of asthma [Z87.09] 05/30/2015  . Migraine without aura and responsive to treatment [G43.009] 05/30/2015  . Extreme obesity [E66.01] 05/30/2015  . 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 Allergic rhinitis, seasonal [J30.2] 05/30/2015  . Vertigo [R42] 05/30/2015  . Anxiety [F41.9] 05/09/2015  . Depression (emotion) [F32.9] 09/20/2013  . IBS (irritable bowel syndrome) [K58.9] 09/20/2013  . H/O cervical spine surgery [Z98.89] 12/29/2012  . Vitamin D deficiency [E55.9] 11/23/2009  . History of mononucleosis [Z86.19] 04/03/2009  . Herpes [B00.9] 11/16/2008   History of Present Illness:  Ms Tracey Perry says she has been depressed since she was 35 and her parents separated.  Depression runs in both sides of her family.  She has been treated with several antidepressants and mood disorder meds with some degree of effectiveness.  Current meds are generally  okay but she starting getting more depressed about 3 weeks ago.  She has been stressed about her cat being very sick and the $1000 vet bill, finances in general, trying to sell her car but not being able to find the title and the gain of about 20-30 pounds in the last few months.  These things all happened at the same time and she has felt overwhelmed.  Was crying on the job for no apparent reason and had to call out.  She feels depressed, sleeps 14 hrs daily, no energy or  Appetite, no motivation or interest in usual activities.  Cut on her arm for the first time in a couple of years.  Thoughts of wanting to not live but no suicidal thoughts.   Elements:  Location:  depression. Quality:  no energy or interest. Severity:  beginning to be unable to work. Timing:  several stresses at once. Duration:  3 weeks. Context:  as above. Associated Signs/Symptoms: Depression Symptoms:  depressed mood, anhedonia, hypersomnia, fatigue, anxiety, loss of energy/fatigue, weight gain, decreased appetite, (Hypo) Manic Symptoms:  Irritable Mood, Anxiety Symptoms:  Excessive Worry, Psychotic Symptoms:  none PTSD Symptoms: Negative  Past Medical History:  Past Medical History  Diagnosis Date  . Herpes simplex without complication   . Vitamin D deficiency   . Morbid obesity due to excess calories   . Depression   . Allergy   . PMDD (premenstrual dysphoric disorder)   . Vertigo   . Gastroparesis   . Anxiety     Past Surgical History  Procedure Laterality Date  . Cholecystectomy    . Lumbar disc surgery    . Tonsillectomy    . Adenoidectomy    . Tympanostomy tube placement    . Appendectomy  Family History:  Family History  Problem Relation Age of Onset  . Depression Mother   . Hypertension Mother     controlled  . Allergic rhinitis Mother   . Anxiety disorder Mother   . Diabetes Father     controlled  . Hypertension Father   . Allergic rhinitis Father   . Depression Father   .  Depression Brother   . Anxiety disorder Brother    Social History:   Social History   Social History  . Marital Status: Single    Spouse Name: N/A  . Number of Children: N/A  . Years of Education: N/A   Social History Main Topics  . Smoking status: Never Smoker   . Smokeless tobacco: Never Used  . Alcohol Use: 0.0 oz/week    0 Standard drinks or equivalent per week     Comment: Occasionally  . Drug Use: No  . Sexual Activity: No   Other Topics Concern  . None   Social History Narrative   Additional Social History: close to her parents, has masters degree in [employer] and looking for a job closer to home,  Has an under employed job, no peer relationships, recently started Lamictal and it seems to be helping  Musculoskeletal: Strength & Muscle Tone: within normal limits Gait & Station: normal Patient leans: N/A  Psychiatric Specialty Exam: HPI  ROS  There were no vitals taken for this visit.There is no weight on file to calculate BMI.  General Appearance: Well Groomed  Eye Contact:  Good  Speech:  Clear and Coherent  Volume:  Normal  Mood:  Depressed  Affect:  Congruent  Thought Process:  Coherent and Logical  Orientation:  Full (Time, Place, and Person)  Thought Content:  Negative  Suicidal Thoughts:  No  Homicidal Thoughts:  No  Memory:  Immediate;   Good Recent;   Good Remote;   Good  Judgement:  Good  Insight:  Good  Psychomotor Activity:  Normal  Concentration:  Good  Recall:  Good  Fund of Knowledge:Good  Language: Good  Akathisia:  Negative  Handed:  Right  AIMS (if indicated):  0  Assets:  Communication Skills Desire for Improvement Financial Resources/Insurance Housing Resilience Social Support Talents/Skills Transportation Vocational/Educational  ADL's:  Intact  Cognition: WNL  Sleep:  14 hrs daily   Is the patient at risk to self?  No. Has the patient been a risk to self in the past 6 months?  No. Has the patient been a risk  to self within the distant past?  Yes.   Is the patient a risk to others?  No. Has the patient been a risk to others in the past 6 months?  No. Has the patient been a risk to others within the distant past?  No.  Allergies:   Allergies  Allergen Reactions  . Nutritional Supplements Itching and Swelling    ONLY certain "FRESH FRUITS" cause throat itches & in spring, throat feels like it will swell.  . Prednisone Other (See Comments) and Palpitations    Severe mood swings and tachycardia   Current Medications: Current Outpatient Prescriptions  Medication Sig Dispense Refill  . albuterol (PROAIR HFA) 108 (90 BASE) MCG/ACT inhaler Inhale into the lungs.    Marland Kitchen asenapine (SAPHRIS) 5 MG SUBL 24 hr tablet Take 1 tablet under the tongue in the evening and 2 tablets under the tongue at bedtime 90 tablet 0  . Asenapine Maleate (SAPHRIS) 10 MG SUBL Place 1 tablet (10  mg total) under the tongue at bedtime. 30 tablet 1  . buPROPion (WELLBUTRIN XL) 300 MG 24 hr tablet Take 1 tablet (300 mg total) by mouth daily. 30 tablet 1  . cetirizine (ZYRTEC) 10 MG tablet Take by mouth.    . fluticasone (FLONASE) 50 MCG/ACT nasal spray 2 sprays by Each Nare route daily.    . lamotrigine (LAMICTAL ODT) 25 MG disintegrating tablet Take one tablet in the morning for seven days and then increase to two tablets in the morning. 60 tablet 1  . levonorgestrel (MIRENA, 52 MG,) 20 MCG/24HR IUD by Intrauterine route.    . loratadine (CLARITIN) 10 MG tablet Take by mouth.    Marland Kitchen LORazepam (ATIVAN) 0.5 MG tablet Take 1 tablet (0.5 mg total) by mouth daily as needed for anxiety. 30 tablet 1  . ondansetron (ZOFRAN) 4 MG tablet Take 1 tablet (4 mg total) by mouth every 8 (eight) hours as needed for nausea or vomiting. (Patient not taking: Reported on 07/19/2015) 20 tablet 0  . valACYclovir (VALTREX) 1000 MG tablet Take 1,000 mg by mouth.    Marland Kitchen VIIBRYD 40 MG TABS Take 1 tablet (40 mg total) by mouth daily. 30 tablet 1   No current  facility-administered medications for this visit.    Previous Psychotropic Medications: Yes   Substance Abuse History in the last 12 months:  No.  Consequences of Substance Abuse: Negative  Medical Decision Making:  Established Problem, Worsening (2)  Treatment Plan Summary: daily group therapy    Donnelly Angelica 9/6/20161:18 PM

## 2015-07-25 NOTE — Addendum Note (Signed)
Addended by: Sherlyn Hay on: 07/25/2015 02:28 PM   Modules accepted: Medications

## 2015-07-26 ENCOUNTER — Other Ambulatory Visit (HOSPITAL_COMMUNITY): Payer: 59 | Admitting: Psychiatry

## 2015-07-26 DIAGNOSIS — F331 Major depressive disorder, recurrent, moderate: Secondary | ICD-10-CM | POA: Diagnosis not present

## 2015-07-26 DIAGNOSIS — F3130 Bipolar disorder, current episode depressed, mild or moderate severity, unspecified: Secondary | ICD-10-CM

## 2015-07-26 NOTE — Progress Notes (Signed)
    Daily Group Progress Note  Program: IOP  Group Time: 9:00-10:30  Participation Level: Active  Behavioral Response: Appropriate  Type of Therapy:  Group Therapy  Summary of Progress: Pt. Presented with primarily flat affect, talkative when prompted. Pt. Discussed low frustration tolerance, difficulties of isolating which she attributed to her cats intruding on her personal space. Pt. Discussed new bipolar diagnosis and feeling good on new medications. Pt. Discussed professional goals and challenges of current work environment due to rapid cycling of highs and lows.      Group Time: 10:30-12:00  Participation Level:  Active  Behavioral Response: Appropriate  Type of Therapy: Psycho-education Group  Summary of Progress: Pt. Participated in discussion about co-dependence (i.e. Patterns of enabling, control, and caretaking).  [facility] PSY[facility]

## 2015-07-27 ENCOUNTER — Other Ambulatory Visit (HOSPITAL_COMMUNITY): Payer: 59 | Admitting: Psychiatry

## 2015-07-27 DIAGNOSIS — F3130 Bipolar disorder, current episode depressed, mild or moderate severity, unspecified: Secondary | ICD-10-CM

## 2015-07-27 DIAGNOSIS — F331 Major depressive disorder, recurrent, moderate: Secondary | ICD-10-CM | POA: Diagnosis not present

## 2015-07-27 NOTE — Progress Notes (Signed)
    Daily Group Progress Note  Program: IOP  Group Time: 9:00-10:30  Participation Level: Active  Behavioral Response: Appropriate  Type of Therapy:  Group Therapy  Summary of Progress: Pt. Discussed dissatisfaction with her current work and worries about completing her master's degree in [employer] and not feeling like moving forward with finding a job in her field. Pt. Discussed patterns of perfectionism and all-or-nothing thinking that have likely interfered with her progress. Pt. Also discussed patterns of emotional eating that may be contributing to emotion disregulation. Pt. Discussed pattern of not eating regularly throughout the day and eating fast food and highly processed convenience foods when she gets home from work.      Group Time: 10:30-12:00  Participation Level:  Active  Behavioral Response: Appropriate  Type of Therapy: Psycho-education Group  Summary of Progress: Pt. Participated in instruction about RAIN method of meditation and participated in guided meditation and breathing exercise.  Lorna Dibble, LPC

## 2015-07-28 ENCOUNTER — Other Ambulatory Visit (HOSPITAL_COMMUNITY): Payer: 59 | Admitting: Psychiatry

## 2015-07-28 DIAGNOSIS — F331 Major depressive disorder, recurrent, moderate: Secondary | ICD-10-CM | POA: Diagnosis not present

## 2015-07-28 DIAGNOSIS — F3130 Bipolar disorder, current episode depressed, mild or moderate severity, unspecified: Secondary | ICD-10-CM

## 2015-07-28 NOTE — Progress Notes (Signed)
    Daily Group Progress Note  Program: IOP  Group Time: 9:00-10:30  Participation Level: Active  Behavioral Response: Appropriate  Type of Therapy:  Group Therapy  Summary of Progress: Pt. Presented with brightened mood. Pt. Discussed challenges that she experienced in middle school and college. Pt. Reported that experience in high school was positive because she went to an all girls boarding school. Pt. Discussed current social group that has been positive for her and relationships at work that are positive. Pt. Participated in breathing exercise.      Group Time: 10:30-12:00  Participation Level:  Active  Behavioral Response: Appropriate  Type of Therapy: Psycho-education Group  Summary of Progress: Pt. Participated in grief and loss group facilitated by Jeanella Craze.   Nancie Neas, LPC

## 2015-07-31 ENCOUNTER — Encounter: Payer: Self-pay | Admitting: Family Medicine

## 2015-07-31 ENCOUNTER — Other Ambulatory Visit (HOSPITAL_COMMUNITY): Payer: 59 | Admitting: Psychiatry

## 2015-07-31 DIAGNOSIS — F331 Major depressive disorder, recurrent, moderate: Secondary | ICD-10-CM | POA: Diagnosis not present

## 2015-07-31 DIAGNOSIS — F3130 Bipolar disorder, current episode depressed, mild or moderate severity, unspecified: Secondary | ICD-10-CM

## 2015-08-01 ENCOUNTER — Other Ambulatory Visit (HOSPITAL_COMMUNITY): Payer: 59 | Admitting: Psychiatry

## 2015-08-01 DIAGNOSIS — F3130 Bipolar disorder, current episode depressed, mild or moderate severity, unspecified: Secondary | ICD-10-CM

## 2015-08-01 DIAGNOSIS — F331 Major depressive disorder, recurrent, moderate: Secondary | ICD-10-CM | POA: Diagnosis not present

## 2015-08-01 NOTE — Progress Notes (Signed)
    Daily Group Progress Note  Program: IOP  Group Time: 9:00-10:30  Participation Level: Active  Behavioral Response: Appropriate  Type of Therapy:  Psycho-education Group  Summary of Progress: Pt. Participated in medication education group with Antares.     Group Time: 10:30-12:00  Participation Level:  Active  Behavioral Response: Appropriate  Type of Therapy: Individual Therapy  Summary of Progress: Pt. Continues to present with primarily flat affect, negative predisposition towards life. Pt. Reports job dissatisfaction, but enjoys socialization provided at work. Pt. Reports lack of motivation to engage in most activities, most troubling is lack of motivation to pursue job in her [employer]. Pt. Reports that she worked all weekend, but a friend cam to her house and cleaned her kitchen. Pt. Reported that she was able to enjoy the fact that her kitchen was clean and was grateful for her friend's willingness to help her.   Nancie Neas, LPC

## 2015-08-01 NOTE — Progress Notes (Signed)
    Daily Group Progress Note  Program: IOP  Group Time: 9:00-10:30  Participation Level: Active  Behavioral Response: Appropriate  Type of Therapy:  Group Therapy  Summary of Progress: Pt. Continues to present as mildly depressed, challenged by negative disposition and outlook. Pt. Reported that she was "tired" and her mood was "ok". Pt. Reported that she felt more relaxed than usual because today is her day off from work, discussed plans to take her cat to the vet.     Group Time: 10:30-12:00  Participation Level:  Active  Behavioral Response: Appropriate  Type of Therapy: Psycho-education Group  Summary of Progress: Pt. Participated in discussion facilitated by Doreene Adas on developing self-efficacy, internal/external locus of control,and redefining success and failure experiences.   Nancie Neas, LPC

## 2015-08-02 ENCOUNTER — Other Ambulatory Visit (HOSPITAL_COMMUNITY): Payer: 59 | Admitting: Psychiatry

## 2015-08-02 DIAGNOSIS — F331 Major depressive disorder, recurrent, moderate: Secondary | ICD-10-CM | POA: Diagnosis not present

## 2015-08-02 DIAGNOSIS — F3130 Bipolar disorder, current episode depressed, mild or moderate severity, unspecified: Secondary | ICD-10-CM

## 2015-08-02 NOTE — Progress Notes (Signed)
08/02/15 Encounter: 9:15 AM - 12:00 PM  Purpose: Addressed recovery through a holistic, multi-faceted approach. Intervention: Open discussion on aspects of wellness, including diet, sleep hygiene, support systems, & mental reserve. Effect: Delano recounted the essential need for self-care in addressing her life stressors.  Doreene Adas, CPSS

## 2015-08-03 ENCOUNTER — Other Ambulatory Visit (HOSPITAL_COMMUNITY): Payer: 59 | Admitting: Psychiatry

## 2015-08-03 DIAGNOSIS — F331 Major depressive disorder, recurrent, moderate: Secondary | ICD-10-CM | POA: Diagnosis not present

## 2015-08-03 NOTE — Progress Notes (Signed)
Patient ID: [name], female   DOB: [birth year], 24 y.o.   MRN: [id number] Discharge Note  Patient:  Tracey Perry is an 23 y.o., female DOB:  Dec 20, 1990  Date of Admission:  07/25/2015  Date of Discharge:  08/07/2015  Reason for Admission:depression  IOP Course:  Ms Neiswender says the program was useful to her as it gave her some structure as well as a chance to be around other people with similar problems.  She learned some coping skills from the group leader as well as some of the peers.  Still depressed and anxious but optimistic about her future.  She does present as more relaxed and happy today.  Mental Status at Discharge:no suicidal thoughts still anxious and depressed but optimistic as opposed to hopeless  Lab Results: No results found for this or any previous visit (from the past 62 hour(s)).   Current outpatient prescriptions:  .  albuterol (PROAIR HFA) 108 (90 BASE) MCG/ACT inhaler, Inhale into the lungs., Disp: , Rfl:  .  asenapine (SAPHRIS) 5 MG SUBL 24 hr tablet, Take 1 tablet under the tongue in the evening and 2 tablets under the tongue at bedtime, Disp: 90 tablet, Rfl: 0 .  buPROPion (WELLBUTRIN XL) 300 MG 24 hr tablet, Take 1 tablet (300 mg total) by mouth daily., Disp: 30 tablet, Rfl: 1 .  cetirizine (ZYRTEC) 10 MG tablet, Take by mouth., Disp: , Rfl:  .  fluticasone (FLONASE) 50 MCG/ACT nasal spray, 2 sprays by Each Nare route daily., Disp: , Rfl:  .  lamoTRIgine (LAMICTAL) 25 MG tablet, Take 2 tablets in the morning for 7 days, then 2 tablets in AM and 1 tablet in PM for 7 days then 2 tablets in morning and 2 tablets in PM, Disp: 120 tablet, Rfl: 0 .  levonorgestrel (MIRENA, 52 MG,) 20 MCG/24HR IUD, by Intrauterine route., Disp: , Rfl:  .  loratadine (CLARITIN) 10 MG tablet, Take by mouth., Disp: , Rfl:  .  LORazepam (ATIVAN) 0.5 MG tablet, Take 1 tablet (0.5 mg total) by mouth daily as needed for anxiety., Disp: 30 tablet, Rfl: 1 .  ondansetron (ZOFRAN) 4 MG  tablet, Take 1 tablet (4 mg total) by mouth every 8 (eight) hours as needed for nausea or vomiting., Disp: 20 tablet, Rfl: 0 .  valACYclovir (VALTREX) 1000 MG tablet, Take 1,000 mg by mouth., Disp: , Rfl:  .  VIIBRYD 40 MG TABS, Take 1 tablet (40 mg total) by mouth daily., Disp: 30 tablet, Rfl: 1  Axis Diagnosis:  Major depression, recurrent moderate   Level of Care:  IOP  Discharge destination:  Other:  already has appointments with her outpatient therapist and psychiatrist  Is patient on multiple antipsychotic therapies at discharge:  No    Has Patient had three or more failed trials of antipsychotic monotherapy by history:  Negative  Patient phone:  [phone] (home)  Patient address:   [address] [place] [address],   Follow-up recommendations:  Activity:  continue current activity Diet:  continue current diet  Comments:  none  The patient received suicide prevention pamphlet:  Negative Belongings returned:  na  Donnelly Angelica 08/03/2015, 11:46 AM

## 2015-08-03 NOTE — Progress Notes (Signed)
Tracey Perry is a 24 y.o. female, who has been attending [facility] since 07-25-15.  Pt became ill today during group.  C/O feeling lightheaded, dizzy, and nauseous.  Pt vomited.  A:  B/P 120/82, P:  77.  Provided pt with water and ice pack.  Informed Dr. Lovena Le.  R:  Pt receptive.        Dellia Nims, M.Ed, CNA

## 2015-08-03 NOTE — Progress Notes (Signed)
    Daily Group Progress Note  Program: IOP  Group Time: 9:00-10:30  Participation Level: Active  Behavioral Response: Appropriate  Type of Therapy:  Group Therapy  Summary of Progress: Pt. Presented as talkative. Pt. Reported that she was feeling very anxious today. Pt. Attributed her anxiety to having to return to work after having yesterday off. Pt. Shared that loss related to ended engagement was painful for her, not feeling engaged in her job and not having the energy to find a new job due to her depression. Pt. Shared concept of energy "spoons" with the group as a way to discern the amount of energy reserved for self-care.      Group Time: 10:30-12:00  Participation Level:  Active  Behavioral Response: Appropriate  Type of Therapy: Psycho-education Group  Summary of Progress: Pt. Participated in discussion about grounding sequence and use of breathing, yoga, and bilateral tapping/stimulation to assist with management of anxiety.   Nancie Neas, LPC

## 2015-08-04 ENCOUNTER — Other Ambulatory Visit (HOSPITAL_COMMUNITY): Payer: 59 | Admitting: Psychiatry

## 2015-08-04 DIAGNOSIS — F331 Major depressive disorder, recurrent, moderate: Secondary | ICD-10-CM | POA: Diagnosis not present

## 2015-08-04 DIAGNOSIS — F3130 Bipolar disorder, current episode depressed, mild or moderate severity, unspecified: Secondary | ICD-10-CM

## 2015-08-04 NOTE — Progress Notes (Signed)
    Daily Group Progress Note  Program: IOP  Group Time: 9:00-10:30  Participation Level: Active  Behavioral Response: Appropriate  Type of Therapy: Group Therapy  Summary of Progress: Pt. Presented as anxious and tearful. Pt. Expressed that she felt anxious because concerned about tire light on her car, ongoing financial stress, job dissatisfaction. Pt. Shared painful relationship with father she describes as narcissistic.     Group Time: 10:30-12:00  Participation Level: Active  Behavioral Response: Appropriate  Type of Therapy: Psycho-education Group  Summary of Progress: Pt. Participated in grief and loss group facilitated by Jeanella Craze.  Nancie Neas, LPC

## 2015-08-04 NOTE — Progress Notes (Signed)
08/04/15 Encounter: 9 AM - 10:30 AM  Purpose: To address role of identity in recovery. Intervention: Open discussion of how we view & define ourselves can facilitate recovery. Effect: Tracey Perry stated that she is striving to understand her own emotions in her attempt to become grounded in her recovery.  Doreene Adas CPSS

## 2015-08-04 NOTE — Progress Notes (Signed)
    Daily Group Progress Note  Program: IOP  Group Time: 9:00-10:30  Participation Level: Active  Behavioral Response: Appropriate  Type of Therapy:  Group Therapy  Summary of Progress: Pt. Continues to present with primarily flat affect, complains of anxiety. Pt. Attributes her anxiety to job dissatisfaction and financial stress.      Group Time: 10:30-12:00  Participation Level:  Active  Behavioral Response: Appropriate  Type of Therapy: Psycho-education Group  Summary of Progress: Pt. Did not attend group. Pt. Complained of lightheadedness and nausea. Pt. Was seen by Dellia Nims for blood pressure reading and was discharged for the day/  Nancie Neas, [facility]

## 2015-08-07 ENCOUNTER — Telehealth (HOSPITAL_COMMUNITY): Payer: Self-pay | Admitting: Psychiatry

## 2015-08-07 ENCOUNTER — Other Ambulatory Visit (HOSPITAL_COMMUNITY): Payer: 59

## 2015-08-08 ENCOUNTER — Other Ambulatory Visit (HOSPITAL_COMMUNITY): Payer: 59 | Admitting: Psychiatry

## 2015-08-08 DIAGNOSIS — F3130 Bipolar disorder, current episode depressed, mild or moderate severity, unspecified: Secondary | ICD-10-CM

## 2015-08-08 DIAGNOSIS — F411 Generalized anxiety disorder: Secondary | ICD-10-CM

## 2015-08-08 DIAGNOSIS — F331 Major depressive disorder, recurrent, moderate: Secondary | ICD-10-CM | POA: Diagnosis not present

## 2015-08-08 NOTE — Progress Notes (Signed)
Tracey Perry is a 24 y.o. , single, employed, Caucasian female. States she has been depressed since she was 64. She has been treated with several antidepressants and mood disorder meds with some degree of effectiveness. Current meds are generally okay but she starting getting more depressed about 3 weeks ago. Triggers: 1) Ill cat: The cat was very sick and there was a $1000 vet bill. Finances in general are a stressor. 2) Trying to sell her car but not being able to find the title. 3) Health Issues (IBS): Reports due to health issues, she has gained 20-30 pounds in the last few months. These things all happened at the same time and she has felt overwhelmed. Was crying on the job for no apparent reason, unable to function and had to call out. She feels depressed, sleeps 14 hrs daily, no energy or appetite, decreased concentration, no motivation or interest in usual activities. Thoughts of wanting to not live but no suicidal thoughts. Denies HI or A/V hallucinations. Pt reports one previous inpatient psychiatric admission in [facility] due to [place]. Pt has been seeing Miguel Dibble, LCSW for eighteen months and Dr. Jimmye Norman for one month. Hx of self-mutilation (cutting). Pt admits to recently superficially cutting on her left arm. Denies any past suicide attempts. Family Hx: Parents, brother (Depression and Anxiety); Paternal Grandmother (Depression). Pt completed MH-IOP today.  Reports feeling overall better.  Continues to struggle with severe anxiety around menstrual cycle time.  Denies SI/HI or A/V hallucinations.  States that the groups were helpful.  "It was great being around people who felt like me."  A:  D/C today.  F/U with Dr. Jimmye Norman on 08-25-15 @ 10:15 a.m.Marland Kitchen  Pt will schedule a f/u appt with her therapist Miguel Dibble, LCSW).  Encouraged support groups.  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  R:  Pt receptive.      Carlis Abbott, RITA, M.Ed, CNA

## 2015-08-08 NOTE — Patient Instructions (Signed)
Patient completed MH-IOP today.  Will follow up with Dr. Jimmye Norman on 08-25-15 @ 10:15 a.m and she plans to schedule an appointment with Miguel Dibble, LCSW soon.  Encouraged support groups.

## 2015-08-08 NOTE — Progress Notes (Signed)
08/08/15 Encounter: 9 AM - 12 PM  Purpose: Addressing coping skills when facing extreme emotions. Intervention: Open discussion on using de-escalation tools to remedy "black & white thinking". Effect: Tracey Perry expressed that "stress baking" assists her greatly in relieving internal & external sources of negative emotion.  Doreene Adas CPSS

## 2015-08-08 NOTE — Progress Notes (Signed)
    Daily Group Progress Note  Program: IOP  Group Time: 9:00-10:30  Participation Level: Active  Behavioral Response: Appropriate  Type of Therapy:  Group Therapy  Summary of Progress: Pt. Presented with mildly brightened affect, prepared for discharge. Pt. Discussed that she felt relief from anxiety because today is her day off and she would be able to go home and go to sleep. Pt. Was able to identify positive attribute about herself and stated "I think I am smart". Pt. Participated in discussion about giving herself permission to feel emotions such as sadness and anger.      Group Time: 10:30-12:00  Participation Level:  Active  Behavioral Response: Appropriate  Type of Therapy: Psycho-education Group  Summary of Progress: Pt. Participated in discussion about managing black and white thinking/all or nothing thinking and perfectionism.   Nancie Neas, LPC

## 2015-08-09 ENCOUNTER — Other Ambulatory Visit (HOSPITAL_COMMUNITY): Payer: 59

## 2015-08-10 ENCOUNTER — Other Ambulatory Visit (HOSPITAL_COMMUNITY): Payer: 59

## 2015-08-11 ENCOUNTER — Other Ambulatory Visit (HOSPITAL_COMMUNITY): Payer: 59

## 2015-08-14 ENCOUNTER — Other Ambulatory Visit (HOSPITAL_COMMUNITY): Payer: 59

## 2015-08-15 ENCOUNTER — Other Ambulatory Visit (HOSPITAL_COMMUNITY): Payer: 59

## 2015-08-25 ENCOUNTER — Ambulatory Visit (INDEPENDENT_AMBULATORY_CARE_PROVIDER_SITE_OTHER): Payer: 59 | Admitting: Licensed Clinical Social Worker

## 2015-08-25 ENCOUNTER — Encounter: Payer: Self-pay | Admitting: Psychiatry

## 2015-08-25 ENCOUNTER — Ambulatory Visit (INDEPENDENT_AMBULATORY_CARE_PROVIDER_SITE_OTHER): Payer: Commercial Managed Care - PPO | Admitting: Psychiatry

## 2015-08-25 VITALS — BP 118/82 | HR 87 | Temp 97.9°F | Ht 65.0 in | Wt 277.4 lb

## 2015-08-25 DIAGNOSIS — F3130 Bipolar disorder, current episode depressed, mild or moderate severity, unspecified: Secondary | ICD-10-CM

## 2015-08-25 DIAGNOSIS — F411 Generalized anxiety disorder: Secondary | ICD-10-CM

## 2015-08-25 MED ORDER — LAMOTRIGINE 25 MG PO TABS: 50.0000 mg | ORAL_TABLET | Freq: Two times a day (BID) | ORAL | Status: DC

## 2015-08-25 MED ORDER — ASENAPINE MALEATE 5 MG SL SUBL: 5.0000 mg | SUBLINGUAL_TABLET | Freq: Every day | SUBLINGUAL | Status: DC

## 2015-08-25 NOTE — Progress Notes (Signed)
THERAPIST PROGRESS NOTE  Session Time: 3:00 p.m. - 4:30 p.m.  Participation Level: Active  Behavioral Response: CasualAlertAnxious and Depressed  Type of Therapy: Individual Therapy  Treatment Goals addressed: Anxiety and Coping  Interventions: CBT, Solution Focused, Strength-based, Supportive and Reframing  Summary: Tracey Perry is a 24 y.o. female who returns to OPT after being out several weeks while participating in the IOP at [facility].  Client discussed her experience with the groups and the many benefits gained.  As a result of her diagnosis being changed to Bi-Polar as per Dr. Jimmye Perry, Tracey Perry found the information in group from staff facilitators and other peers to be very useful.  Client reflected on information provided to her by LCSW in the past and how the information on mood disorders made sense to her based on how she has felt on and off since college age years.  Her sleep has been good overall but not over the past two days.  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  She recently found another job at the [employer] location for which she will apply. Relationship with family is stable. Her father and his girlfriend are signing on a house soon, her brother continues to live with her mother which is stressful at times yet no recent stressors reported.  Given that she began to not benefit from Saphris, Ciana is being weaned off the Saphris and was started on Lamictal which she has taken for past two months with good results and no side effects reported.  New strategies gained from IOP were shared with LCSW, and one is use of binaurall beats since she has not felt able to use meditation, 4, 3, 8 breathing  which can help anxiety.  "Very fleeting" is how Tracey Perry described her thoughts about SIB & primary trigger per client is when she sees a knife.  She reassured LCSW that she dismisses the thoughts and able to talk self through urges to self harm.  "My anxiety is higher than it's been being." Rated her anxiety as about a four versus a 1 or 2 which is her goal. This on a 10 point scale with 10 being the worst.  She is working towards increasing her level of under-standing and insight into triggers/causes for anxious symptoms and anxious thoughts.  Tracey Perry was receptive to also trying various relaxation and other self-care/mental health apps on her smart phone.  Tracey Perry expressed disappointment and sadness regarding news of LCSW's resignation from the clinic.  She accepted letter from [facility] outlining her OPT options and will think about what she wants to do and notify LCSW at her follow up session which client and LCSW would be her last in this clinic.  She denied questions and wished LCSW well.  Client would like to return at least one more time to OPT and voiced goal below to make good use of that session.  GOAL: Understanding her anxiety and worry and gaining coping skills.  Suicidal/Homicidal: Negativewithout intent/plan  Therapist Response:     Emotional and social support were provided to client and commended her on being pro-active in her care and completion of IOP program.  Psycho-education continued on self-evaluation  regarding mood tracking, sleep hygiene, ability to calm/soothe self and ways that her thoughts become distorted/irrational and tips for un-twisting these cognitions. Reinforced use of technology in the form of apps that teach relaxation, guided imagery and also redirect negative thoughts through positive affirmations and mindfulness techniques.  Suggested book about understanding and living with a diagnosis of Bi-Polar Disorder.  LCSW thanked client for her dedication and work  during the therapy process and for her allowing LCSW to be a part of her journey towards recovery.  Explained resignation to be effective 09/22/15, and offered client a letter that explains client's OPT options.  Encouraged her to ask questions and inform LCSW or front office staff of her decision about remaining with [facility] or being assisted with an outside referral/provider.  Plan: Return again in 1-2 weeks so that client & LCSW can discuss her decision about continued OPT services. Tracey Perry will continue to use coping skills and psycho-educational materials provided both from OPT & IOP to maintain stability in moods and behaviors.  She will keep all appointments and report any adverse affects of medications to her MD.   Diagnosis: BiPolar Disorder, Most Recent Episode, Depressed, Moderate      Generalized Anxiety Disorder   Self-Mutilation (Rule-Out Borderline Personality Disorder)    Tracey Dibble, LCSW 08/25/2015

## 2015-08-25 NOTE — Progress Notes (Signed)
BH MD/PA/NP OP Progress Note  08/25/2015 11:00 AM Tracey Perry  MRN:  [id number]  Subjective:  Patient returns for follow-up of her major depressive disorder, severe recurrent and anxiety. She indicated that she has completed the intensive outpatient program. She states she found it worthwhile because she saw that there were other people dealing with the same issue she is dealing with and she was able to hear about the way they cope with their issues. She continues to go to work. She feels like her mood is good and feels like the Lamictal has helped stabilize her mood where it is not up and down. She indicated ultimately she would like to stop her Saphris because she feels it does cause her sedation even when she just takes her doses at bedtime. She states her appetite is good and states that she does binge twice per week. She states she continues to engage in activities such as visiting a close friend and reading  Chief Complaint: would like to stop the saphris Chief Complaint    Follow-up; Medication Refill     Visit Diagnosis:   No diagnosis found.  Past Medical History:  Past Medical History  Diagnosis Date  . Herpes simplex without complication   . Vitamin D deficiency   . Morbid obesity due to excess calories ([place])   . Depression   . Allergy   . PMDD (premenstrual dysphoric disorder)   . Vertigo   . Gastroparesis   . Anxiety     Past Surgical History  Procedure Laterality Date  . Cholecystectomy    . Lumbar disc surgery    . Tonsillectomy    . Adenoidectomy    . Tympanostomy tube placement    . Appendectomy     Family History:  Family History  Problem Relation Age of Onset  . Depression Mother   . Hypertension Mother     controlled  . Allergic rhinitis Mother   . Anxiety disorder Mother   . Diabetes Father     controlled  . Hypertension Father   . Allergic rhinitis Father   . Depression Father   . Depression Brother   . Anxiety disorder Brother     Social History:  Social History   Social History  . Marital Status: Single    Spouse Name: N/A  . Number of Children: N/A  . Years of Education: N/A   Social History Main Topics  . Smoking status: Never Smoker   . Smokeless tobacco: Never Used  . Alcohol Use: 0.0 oz/week    0 Standard drinks or equivalent per week     Comment: Occasionally  . Drug Use: No  . Sexual Activity: No   Other Topics Concern  . None   Social History Narrative   Additional History:   Assessment:   Musculoskeletal: Strength & Muscle Tone: within normal limits Gait & Station: normal Patient leans: N/A  Psychiatric Specialty Exam: HPI  Review of Systems  Psychiatric/Behavioral: Negative for depression, suicidal ideas, hallucinations, memory loss and substance abuse. The patient is not nervous/anxious and does not have insomnia.   All other systems reviewed and are negative.   Blood pressure 118/82, pulse 87, temperature 97.9 F (36.6 C), temperature source Tympanic, height 5\' 5"  (1.651 m), weight 277 lb 6.4 oz (125.828 kg), SpO2 97 %.Body mass index is 46.16 kg/(m^2).  General Appearance: Well Groomed  Eye Contact:  Good  Speech:  Normal Rate  Volume:  Normal  Mood:  better  Affect:  She was bright initially than when I reminded her of the possible trigger she was slightly tearful for a few minutes.  Thought Process:  Linear and Logical  Orientation:  Full (Time, Place, and Person)  Thought Content:  Negative  Suicidal Thoughts:  No  Homicidal Thoughts:  No  Memory:  Immediate;   Good Recent;   Good Remote;   Good  Judgement:  Good  Insight:  Good  Psychomotor Activity:  Negative  Concentration:  Good  Recall:  Good  Fund of Knowledge: Good  Language: Good  Akathisia:  Negative  Handed:  Right unknown  AIMS (if indicated):  Not done today  Assets:  Communication Skills Desire for Improvement Vocational/Educational  ADL's:  Intact  Cognition: WNL  Sleep:  good   Is the  patient at risk to self?  No. SIB related to emotions, No SI Has the patient been a risk to self in the past 6 months?  Yes.   Has the patient been a risk to self within the distant past?  Yes.   Is the patient a risk to others?  No. Has the patient been a risk to others in the past 6 months?  No. Has the patient been a risk to others within the distant past?  No.  Current Medications: Current Outpatient Prescriptions  Medication Sig Dispense Refill  . albuterol (PROAIR HFA) 108 (90 BASE) MCG/ACT inhaler Inhale into the lungs.    Marland Kitchen asenapine (SAPHRIS) 5 MG SUBL 24 hr tablet Place 1 tablet (5 mg total) under the tongue at bedtime. 30 tablet 0  . buPROPion (WELLBUTRIN XL) 300 MG 24 hr tablet Take 1 tablet (300 mg total) by mouth daily. 30 tablet 1  . cetirizine (ZYRTEC) 10 MG tablet Take by mouth.    . fluticasone (FLONASE) 50 MCG/ACT nasal spray 2 sprays by Each Nare route daily.    Marland Kitchen lamoTRIgine (LAMICTAL) 25 MG tablet Take 2 tablets (50 mg total) by mouth 2 (two) times daily. 120 tablet 2  . levonorgestrel (MIRENA, 52 MG,) 20 MCG/24HR IUD by Intrauterine route.    . loratadine (CLARITIN) 10 MG tablet Take by mouth.    Marland Kitchen LORazepam (ATIVAN) 0.5 MG tablet Take 1 tablet (0.5 mg total) by mouth daily as needed for anxiety. 30 tablet 1  . ondansetron (ZOFRAN) 4 MG tablet Take 1 tablet (4 mg total) by mouth every 8 (eight) hours as needed for nausea or vomiting. 20 tablet 0  . valACYclovir (VALTREX) 1000 MG tablet Take 1,000 mg by mouth.    Marland Kitchen VIIBRYD 40 MG TABS Take 1 tablet (40 mg total) by mouth daily. 30 tablet 1   No current facility-administered medications for this visit.    Medical Decision Making:  Established Problem, Worsening (2), Review of Medication Regimen & Side Effects (2) and Review of New Medication or Change in Dosage (2)  Treatment Plan Summary:Medication management and Plan   Major depressive disorder, recurrent, severe without psychotic features-continue Lamictal 50 mg  twice daily. She will continue her Wellbutrin XL 300 mg daily, Viibryd 40 mg daily. We will decrease her Saphris from 10 mg at bedtime to 5 mg at bedtime and assess for any changes as she may now have adequate mood stabilization from her Lamictal. Provided patient samples of Saphris 5 mg tablets, #30  Anxiety-lorazepam 0.5 mg daily as needed  A she'll follow up in 1 month. She's been encouraged calling questions concerns prior to her next appointment.     Faith Rogue  08/25/2015, 11:00 AM

## 2015-09-05 ENCOUNTER — Ambulatory Visit (INDEPENDENT_AMBULATORY_CARE_PROVIDER_SITE_OTHER): Payer: 59 | Admitting: Licensed Clinical Social Worker

## 2015-09-05 DIAGNOSIS — Z7289 Other problems related to lifestyle: Secondary | ICD-10-CM

## 2015-09-05 DIAGNOSIS — F3130 Bipolar disorder, current episode depressed, mild or moderate severity, unspecified: Secondary | ICD-10-CM

## 2015-09-05 DIAGNOSIS — F489 Nonpsychotic mental disorder, unspecified: Secondary | ICD-10-CM

## 2015-09-05 DIAGNOSIS — F411 Generalized anxiety disorder: Secondary | ICD-10-CM | POA: Diagnosis not present

## 2015-09-05 NOTE — Progress Notes (Signed)
THERAPIST PROGRESS NOTE  Session Time: 10:10 a.m. - 11:10 a.m.  Participation Level: Active  Behavioral Response: CasualAlertAnxious and Depressed (Improving)  Type of Therapy: Individual Therapy  Treatment Goals addressed: Anxiety and Coping  Interventions: CBT, Solution Focused, Strength-based, Supportive and Reframing  Summary: Tracey Perry is a 24 y.o. female who returns to OPT to address ongoing symptoms of new diagnosis of Bi-Polar.   She has been sick throwing up over the week-end and has had to leave work but has shown up and made effort. On a positive note she stated "I read five books yesterday."  She did have at least two days of not being able to keep her medications down but denied side effects or mood instability.  She has enjoyed getting together with her family and denied symptoms of SIB or SI.  Some reduction in sleep down to 6 1/2 hours and no specific plan yet is realistic that being sick and napping has interfered.  Besides not taking naps she plans to start re-taking Melatonin.   Mabry yawned a lot during session yet was alert. Her thoughts and goals continue to be forward and future oriented and she is enjoying activities and more social engaged than she has been.  Client able to observe and reflect on the dialectics of life and pointed out that there have been both positives and negatives since our last session. On a positive note she received a [employer] which will save her $200 per month.  A bit of a negative stressor is due to her having slight car problems but this is under warranty for at least another 20,000 miles which offers her reassurance that financially she is not likely to be responsible for repair costs.   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  Rihanna has become friends with co-workers  and with plans to take a ride up the mountains to see the leaves change.   Has maintained phone contact with female friend, [name], and we discussed how to approach him since his mother died.   Layali expressed disappointment and sadness regarding news of LCSW's resignation from the clinic.  She will have at least one additional follow up session with LCSW and will inform of her decision about where she prefers to continue with OPT services. In terms of her experience in therapy she informed LCSW "I usually fire therapists so the fact that I've kept coming back says a lot.  I like how you pull things out of me and offer me suggestions on what to do about these things."  She denied criticisms or recommendations to improve care/services.  Goal: To drop down to 150mg  of Wellbutrin and off of the Saphris but no changes desired in her medications at this time.  Suicidal/Homicidal: Negativewithout intent/plan  Therapist Response:     Emotional and social support were provided to client and commended her on being pro-active in her care and efforts both in therapy and outside of therapy to educate self about living with a mood disorder and anxiety.  Psycho-education continued on self-evaluation regarding mood tracking, sleep hygiene, ability to calm/soothe self and ways that her thoughts become distorted/irrational and tips for un-twisting these cognitions. Requested that Marketa begin to evaluate her experience with this therapist and to offer feedback/suggestions for improvement as well as what has been most effective/helpful.  LCSW again thanked client for her dedication and work during the therapy process and for her allowing LCSW to be a part of her journey towards recovery.    Plan: Return again in 1-2 weeks so that client & LCSW can discuss her decision about continued OPT services. Corayma will continue to use coping skills and psycho-educational materials provided both from OPT & IOP to maintain  stability in moods and behaviors.  She will keep all appointments and report any adverse affects of medications to her MD.   Diagnosis: BiPolar Disorder, Most Recent Episode, Depressed, Moderate      Generalized Anxiety Disorder   Self-Mutilation (Rule-Out Borderline Personality Disorder)   Miguel Dibble, LCSW 09/05/2015

## 2015-09-19 ENCOUNTER — Ambulatory Visit (INDEPENDENT_AMBULATORY_CARE_PROVIDER_SITE_OTHER): Payer: 59 | Admitting: Licensed Clinical Social Worker

## 2015-09-19 DIAGNOSIS — F411 Generalized anxiety disorder: Secondary | ICD-10-CM

## 2015-09-19 DIAGNOSIS — F489 Nonpsychotic mental disorder, unspecified: Secondary | ICD-10-CM | POA: Diagnosis not present

## 2015-09-19 DIAGNOSIS — Z7289 Other problems related to lifestyle: Secondary | ICD-10-CM

## 2015-09-19 DIAGNOSIS — F3130 Bipolar disorder, current episode depressed, mild or moderate severity, unspecified: Secondary | ICD-10-CM | POA: Diagnosis not present

## 2015-09-19 NOTE — Progress Notes (Signed)
   THERAPIST PROGRESS NOTE  Session Time: 1:05 p.m. -  2:15 p.m.  Participation Level: Active  Behavioral Response: CasualAlertAngry, Anxious and Depressed  Type of Therapy: Individual Therapy  Treatment Goals addressed: Anxiety and Coping  Interventions: CBT, Solution Focused, Strength-based, Supportive and Reframing  Summary: Tracey Perry is a 24 y.o. female who returns to OPT stating:  "I've been moody lately. I think it's because it's that time of the month." Described feeling more anxious and irritable which she believes is primarily hormonal and stated "It's the littlest things that set me off."  Talked about recent experience at a Hockey game with her father and his fiance' where the opposing team won and one particular fan was bothersome and this caused her to become irritated.  Tracey Perry highlighted various triggers for this. "I threw a water bottle against the door the car ride home rather than hurting myself."  SIB urges are described as fleeting by client and she has not self harmed since before entering the IOP program.  "I use distraction."  She described self as having moments where she wanted to cry but did not and kept reading a book.  Tracey Perry talked about how the Holidays are difficult for her stating "Because people expect you to be happy."  On a positive note, she continues to enjoy her job and co-workers, has had continued improvement in interests and concentration as evidenced by reading multiple books over the past week.  She is tending to personal hygiene and managing daily tasks.  Client voiced belief that her medications need adjusting.  She continues to see Dr. Jimmye Norman in this clinic.  Relief and despair is how she described her diagnosis of Bi-Polar.  Tracey Perry continues adjusting to her diagnosis and commented "I'm not sure that I know who I am without it."  Benefits of learning how to adapt to and live healthy with her symptoms was discussed as part of one  of the IOP groups that she attended at [place] adamantly denied SIB urges or SI during session today and was receptive to article about understanding SI and strategies for coping with this.  Given the delay in OPT services due to [employer], Tracey Perry was receptive to seeing Royal Piedra, LCSW in this practice until she and this LCSW can resume services.  Suicidal/Homicidal: Negativewithout intent/plan  Therapist Response:     LCSW assessed client's functional status, coping resources and for risk factors.  Emotional and social support provided to client and commended her on being pro-active in her care and efforts both in therapy and outside of therapy to educate self about living with a mood disorder and anxiety.  Psycho-education continued on understanding concepts and coping strategies around distress tolerance, radical acceptance and recognizing cognitive distortions.  LCSW again thanked client for her dedication and work during the therapy process and for her allowing LCSW to be a part of her journey towards recovery.    Plan: Return again in 2-3 weeks to see Royal Piedra for OPT.  Tracey Perry will continue to use coping skills and psycho-educational materials provided both from OPT & IOP to maintain stability in moods and behaviors.  She will keep all appointments and report any adverse affects of medications to her MD.   Diagnosis: BiPolar Disorder, Most Recent Episode, Depressed, Moderate      Generalized Anxiety Disorder   Self-Mutilation (Rule-Out Borderline Personality Disorder)   Miguel Dibble, LCSW 09/19/2015

## 2015-09-26 ENCOUNTER — Encounter: Payer: Self-pay | Admitting: Psychiatry

## 2015-09-26 ENCOUNTER — Ambulatory Visit (INDEPENDENT_AMBULATORY_CARE_PROVIDER_SITE_OTHER): Payer: 59 | Admitting: Psychiatry

## 2015-09-26 VITALS — BP 122/88 | HR 88 | Temp 97.6°F | Ht 65.0 in | Wt 281.6 lb

## 2015-09-26 DIAGNOSIS — F411 Generalized anxiety disorder: Secondary | ICD-10-CM

## 2015-09-26 DIAGNOSIS — F3130 Bipolar disorder, current episode depressed, mild or moderate severity, unspecified: Secondary | ICD-10-CM | POA: Diagnosis not present

## 2015-09-26 MED ORDER — VIIBRYD 40 MG PO TABS: 40.0000 mg | ORAL_TABLET | Freq: Every day | ORAL | Status: DC

## 2015-09-26 MED ORDER — BUPROPION HCL ER (XL) 300 MG PO TB24: 300.0000 mg | ORAL_TABLET | Freq: Every day | ORAL | Status: DC

## 2015-09-26 NOTE — Progress Notes (Addendum)
BH MD/PA/NP OP Progress Note  09/26/2015 9:25 AM Tracey Perry  MRN:  [id number]  Subjective:  Patient returns for follow-up of her polar disorder most recent episode depressed and anxiety. She indicates that overall her mood has been good. She states she continues her routine of getting up going to work coming home taking care of her animals and going to bed. She feels like the current medications have kept her even. She's kept a mood log and has found that her mood is stated right in the middle. She indicates that right now the Saphris still cause sedation even at 5 mg at bedtime. She states she has sedation in the daytime. We have preceded discussed that she is on Lamictal for mood stabilization and thus it might make sense to see if we can utilize that agent given that she is getting daytime sedation even at a low dose of the Saphris.  Chief Complaint: Tired Chief Complaint    Follow-up; Medication Refill     Visit Diagnosis:     ICD-9-CM ICD-10-CM   1. Bipolar affective disorder, current episode depressed, current episode severity unspecified ([place]) 296.50 F31.30   2. Generalized anxiety disorder 300.02 F41.1     Past Medical History:  Past Medical History  Diagnosis Date  . Herpes simplex without complication   . Vitamin D deficiency   . Morbid obesity due to excess calories ([place])   . Depression   . Allergy   . PMDD (premenstrual dysphoric disorder)   . Vertigo   . Gastroparesis   . Anxiety     Past Surgical History  Procedure Laterality Date  . Cholecystectomy    . Lumbar disc surgery    . Tonsillectomy    . Adenoidectomy    . Tympanostomy tube placement    . Appendectomy     Family History:  Family History  Problem Relation Age of Onset  . Depression Mother   . Hypertension Mother     controlled  . Allergic rhinitis Mother   . Anxiety disorder Mother   . Diabetes Father     controlled  . Hypertension Father   . Allergic rhinitis Father   . Depression  Father   . Depression Brother   . Anxiety disorder Brother    Social History:  Social History   Social History  . Marital Status: Single    Spouse Name: N/A  . Number of Children: N/A  . Years of Education: N/A   Social History Main Topics  . Smoking status: Never Smoker   . Smokeless tobacco: Never Used  . Alcohol Use: 0.0 oz/week    0 Standard drinks or equivalent per week     Comment: Occasionally  . Drug Use: No  . Sexual Activity: No   Other Topics Concern  . None   Social History Narrative   Additional History:   Assessment:   Musculoskeletal: Strength & Muscle Tone: within normal limits Gait & Station: normal Patient leans: N/A  Psychiatric Specialty Exam: HPI  Review of Systems  Psychiatric/Behavioral: Negative for depression, suicidal ideas, hallucinations, memory loss and substance abuse. The patient is not nervous/anxious and does not have insomnia.   All other systems reviewed and are negative.   Blood pressure 122/88, pulse 88, temperature 97.6 F (36.4 C), temperature source Tympanic, height 5\' 5"  (1.651 m), weight 281 lb 9.6 oz (127.733 kg), SpO2 97 %.Body mass index is 46.86 kg/(m^2).  General Appearance: Well Groomed  Eye Contact:  Good  Speech:  Normal Rate  Volume:  Normal  Mood:  Good  Affect:  Congruent  Thought Process:  Linear and Logical  Orientation:  Full (Time, Place, and Person)  Thought Content:  Negative  Suicidal Thoughts:  No  Homicidal Thoughts:  No  Memory:  Immediate;   Good Recent;   Good Remote;   Good  Judgement:  Good  Insight:  Good  Psychomotor Activity:  Negative  Concentration:  Good  Recall:  Good  Fund of Knowledge: Good  Language: Good  Akathisia:  Negative  Handed:  Right unknown  AIMS (if indicated):  Not done today  Assets:  Communication Skills Desire for Improvement Vocational/Educational  ADL's:  Intact  Cognition: WNL  Sleep:  good   Is the patient at risk to self?  No. SIB related to  emotions, No SI Has the patient been a risk to self in the past 6 months?  Yes.   Has the patient been a risk to self within the distant past?  Yes.   Is the patient a risk to others?  No. Has the patient been a risk to others in the past 6 months?  No. Has the patient been a risk to others within the distant past?  No.  Current Medications: Current Outpatient Prescriptions  Medication Sig Dispense Refill  . albuterol (PROAIR HFA) 108 (90 BASE) MCG/ACT inhaler Inhale into the lungs.    Marland Kitchen buPROPion (WELLBUTRIN XL) 300 MG 24 hr tablet Take 1 tablet (300 mg total) by mouth daily. 30 tablet 2  . cetirizine (ZYRTEC) 10 MG tablet Take by mouth.    . fluticasone (FLONASE) 50 MCG/ACT nasal spray 2 sprays by Each Nare route daily.    Marland Kitchen lamoTRIgine (LAMICTAL) 25 MG tablet Take 2 tablets (50 mg total) by mouth 2 (two) times daily. 120 tablet 2  . levonorgestrel (MIRENA, 52 MG,) 20 MCG/24HR IUD by Intrauterine route.    . loratadine (CLARITIN) 10 MG tablet Take by mouth.    Marland Kitchen LORazepam (ATIVAN) 0.5 MG tablet Take 1 tablet (0.5 mg total) by mouth daily as needed for anxiety. 30 tablet 1  . ondansetron (ZOFRAN) 4 MG tablet Take 1 tablet (4 mg total) by mouth every 8 (eight) hours as needed for nausea or vomiting. 20 tablet 0  . valACYclovir (VALTREX) 1000 MG tablet Take 1,000 mg by mouth.    Marland Kitchen VIIBRYD 40 MG TABS Take 1 tablet (40 mg total) by mouth daily. 30 tablet 2   No current facility-administered medications for this visit.    Medical Decision Making:  Established Problem, Worsening (2), Review of Medication Regimen & Side Effects (2) and Review of New Medication or Change in Dosage (2)  Treatment Plan Summary:Medication management and Plan   Bipolar disorder most recent episode depressed-continue Lamictal 50 mg twice daily. She will continue her Wellbutrin XL 300 mg daily, Viibryd 40 mg daily. She will stop her Saphris 5 mg. We discussed we will assess for whether she will need adjustments such  as an increase in her Lamictal or additional mood stabilizers since she'll be off the Saphris that is caused her daytime sedation. We have discussed that patient needs additional Lamictal for mood stabilization and that is not effective, patient reports she has not been on other mood stabilizers that include Invega, Geodon, Rick salty or Latuda.  Anxiety-lorazepam 0.5 mg daily as needed. She indicates she still has a full bottle as she uses this only as needed.  A she'll follow up in 2 weeks.Marland Kitchen  She's been encouraged calling questions concerns prior to her next appointment.     Faith Rogue 09/26/2015, 9:25 AM

## 2015-09-27 ENCOUNTER — Telehealth: Payer: Self-pay | Admitting: Psychiatry

## 2015-09-27 NOTE — Telephone Encounter (Signed)
Patient contacted the clinic indicating she's been having a panic attack since 11 PM. She states she's taken for over 0.5 Ativan since then and she has not had any relief. When asked about any suicidal ideation she just stated she had some briefly but does not have any currently. I informed her that since she's taken a toll about 2 mg of Ativan in the anxiety is not remitted that she should go to the emergency room as she might need an assessment for any physical issues or perhaps given more medication in a monitored setting. Patient indicates she did have her mother who can drive her to the emergency room. AW

## 2015-09-28 ENCOUNTER — Encounter: Payer: Self-pay | Admitting: Psychiatry

## 2015-09-28 ENCOUNTER — Ambulatory Visit (INDEPENDENT_AMBULATORY_CARE_PROVIDER_SITE_OTHER): Payer: 59 | Admitting: Psychiatry

## 2015-09-28 VITALS — BP 128/68 | HR 98 | Temp 98.1°F | Ht 65.0 in | Wt 280.8 lb

## 2015-09-28 DIAGNOSIS — F411 Generalized anxiety disorder: Secondary | ICD-10-CM | POA: Diagnosis not present

## 2015-09-28 MED ORDER — CLONAZEPAM 1 MG PO TABS: 1.0000 mg | ORAL_TABLET | Freq: Two times a day (BID) | ORAL | Status: DC

## 2015-09-28 NOTE — Progress Notes (Signed)
BH MD/PA/NP OP Progress Note  09/28/2015 12:23 PM LANIS MCWHITE  MRN:  [id number]  Subjective:  Patient returns for follow-up of her polar disorder most recent episode depressed and anxiety. He presents for this appointment just 2 days after her most recent appointment. She states that after she found out about the results of the election late Tuesday evening she went into what she describes as a continuous panic attack. She contacted the clinic yesterday. She had told me at that time she had arty taken for over 0.5 mg Ativan and over the course of yesterday morning but still did not have any relief. At that point I instructed to go to the emergency room for treatment because she might need more medication in a supervised setting. Patient states today she did go to the [facility] emergency room where they gave her Ativan by IV. She states it was somewhat helpful as she slept and was relaxed and therefore did not have any more panic symptoms. She states today she continues to feel anxiety worry and fear.  We process what she was worried about. She did she was fearful because of president elect's views on immigration, gay marriage, and health insurance. She indicated she has a friend who has a parent who is not a citizen, she is on health insurance because of current policies that things that are going to happen to her and the others if these policies change.  We processed ways to deal with this. We processed being active. Patient states that she continues to get support from her mother and that she can spend time with her mother at her mother's workplace. She states that her mother can give her assignments to do there and that might be a keep her busy.    Chief Complaint: fear Chief Complaint    Panic Attack; Anxiety; Follow-up; Medication Refill     Visit Diagnosis:   No diagnosis found.  Past Medical History:  Past Medical History  Diagnosis Date  . Herpes simplex without complication   .  Vitamin D deficiency   . Morbid obesity due to excess calories ([place])   . Depression   . Allergy   . PMDD (premenstrual dysphoric disorder)   . Vertigo   . Gastroparesis   . Anxiety     Past Surgical History  Procedure Laterality Date  . Cholecystectomy    . Lumbar disc surgery    . Tonsillectomy    . Adenoidectomy    . Tympanostomy tube placement    . Appendectomy     Family History:  Family History  Problem Relation Age of Onset  . Depression Mother   . Hypertension Mother     controlled  . Allergic rhinitis Mother   . Anxiety disorder Mother   . Diabetes Father     controlled  . Hypertension Father   . Allergic rhinitis Father   . Depression Father   . Depression Brother   . Anxiety disorder Brother    Social History:  Social History   Social History  . Marital Status: Single    Spouse Name: N/A  . Number of Children: N/A  . Years of Education: N/A   Social History Main Topics  . Smoking status: Never Smoker   . Smokeless tobacco: Never Used  . Alcohol Use: 0.0 oz/week    0 Standard drinks or equivalent per week     Comment: Occasionally  . Drug Use: No  . Sexual Activity: No   Other Topics  Concern  . None   Social History Narrative   Additional History:   Assessment:   Musculoskeletal: Strength & Muscle Tone: within normal limits Gait & Station: normal Patient leans: N/A  Psychiatric Specialty Exam: Anxiety Symptoms include nervous/anxious behavior. Patient reports no insomnia or suicidal ideas.      Review of Systems  Psychiatric/Behavioral: Negative for depression, suicidal ideas, hallucinations, memory loss and substance abuse. The patient is nervous/anxious. The patient does not have insomnia.   All other systems reviewed and are negative.   Blood pressure 128/68, pulse 98, temperature 98.1 F (36.7 C), temperature source Tympanic, height 5\' 5"  (1.651 m), weight 280 lb 12.8 oz (127.37 kg), SpO2 99 %.Body mass index is 46.73 kg/(m^2).   General Appearance: Well Groomed  Eye Contact:  Good  Speech:  Normal Rate  Volume:  Normal  Mood:  Good  Affect:  Congruent  Thought Process:  Linear and Logical  Orientation:  Full (Time, Place, and Person)  Thought Content:  Negative  Suicidal Thoughts:  No  Homicidal Thoughts:  No  Memory:  Immediate;   Good Recent;   Good Remote;   Good  Judgement:  Good  Insight:  Good  Psychomotor Activity:  Negative  Concentration:  Good  Recall:  Good  Fund of Knowledge: Good  Language: Good  Akathisia:  Negative  Handed:  Right unknown  AIMS (if indicated):  Not done today  Assets:  Communication Skills Desire for Improvement Vocational/Educational  ADL's:  Intact  Cognition: WNL  Sleep:  good   Is the patient at risk to self?  No. SIB related to emotions, No SI Has the patient been a risk to self in the past 6 months?  Yes.   Has the patient been a risk to self within the distant past?  Yes.   Is the patient a risk to others?  No. Has the patient been a risk to others in the past 6 months?  No. Has the patient been a risk to others within the distant past?  No.  Current Medications: Current Outpatient Prescriptions  Medication Sig Dispense Refill  . albuterol (PROAIR HFA) 108 (90 BASE) MCG/ACT inhaler Inhale into the lungs.    Marland Kitchen buPROPion (WELLBUTRIN XL) 300 MG 24 hr tablet Take 1 tablet (300 mg total) by mouth daily. 30 tablet 2  . cetirizine (ZYRTEC) 10 MG tablet Take by mouth.    . fluticasone (FLONASE) 50 MCG/ACT nasal spray 2 sprays by Each Nare route daily.    Marland Kitchen lamoTRIgine (LAMICTAL) 25 MG tablet Take 2 tablets (50 mg total) by mouth 2 (two) times daily. 120 tablet 2  . levonorgestrel (MIRENA, 52 MG,) 20 MCG/24HR IUD by Intrauterine route.    . loratadine (CLARITIN) 10 MG tablet Take by mouth.    Marland Kitchen LORazepam (ATIVAN) 0.5 MG tablet Take 1 tablet (0.5 mg total) by mouth daily as needed for anxiety. 30 tablet 1  . ondansetron (ZOFRAN) 4 MG tablet Take 1 tablet (4 mg  total) by mouth every 8 (eight) hours as needed for nausea or vomiting. 20 tablet 0  . valACYclovir (VALTREX) 1000 MG tablet Take 1,000 mg by mouth.    Marland Kitchen VIIBRYD 40 MG TABS Take 1 tablet (40 mg total) by mouth daily. 30 tablet 2  . clonazePAM (KLONOPIN) 1 MG tablet Take 1 tablet (1 mg total) by mouth 2 (two) times daily. 60 tablet 0   No current facility-administered medications for this visit.    Medical Decision Making:  Established  Problem, Worsening (2), Review of Medication Regimen & Side Effects (2) and Review of New Medication or Change in Dosage (2)  Treatment Plan Summary:Medication management and Plan   Bipolar disorder most recent episode depressed-continue Lamictal 50 mg twice daily. She will continue her Wellbutrin XL 300 mg daily, Viibryd 40 mg daily. She will stop her Saphris 5 mg. We discussed we will assess for whether she will need adjustments such as an increase in her Lamictal or additional mood stabilizers since she'll be off the Saphris that is caused her daytime sedation. We have discussed that patient needs additional Lamictal for mood stabilization and that is not effective, patient reports she has not been on other mood stabilizers that include Invega, Geodon, Rick salty or Latuda.  Anxiety-as above given the recent issues and her intense anxiety which developed late on November 8. I do not feel it's a need to change all of her medications. However I did discuss perhaps trying Klonopin does the lorazepam she is using is short acting and appear she's having some continuous anxiety in response to the election results. Thus instructed her to stop taking the lorazepam and start taking clonazepam 1 mg twice a day. Risk and benefits of other clonazepam of been discussing patient's able to consent.  Given the intensity of patient's anxiety and the need to try to get it under control I have written a note for patient to be excused from work for today. Patient did seem to feel she  would be able to return to work tomorrow.  She artery has follow up in 2 weeks.. She's been encouraged calling questions concerns prior to her next appointment.     Faith Rogue 09/28/2015, 12:23 PM

## 2015-09-29 ENCOUNTER — Ambulatory Visit (INDEPENDENT_AMBULATORY_CARE_PROVIDER_SITE_OTHER): Payer: 59 | Admitting: Licensed Clinical Social Worker

## 2015-09-29 DIAGNOSIS — F3130 Bipolar disorder, current episode depressed, mild or moderate severity, unspecified: Secondary | ICD-10-CM | POA: Diagnosis not present

## 2015-10-04 ENCOUNTER — Encounter: Payer: Self-pay | Admitting: Obstetrics and Gynecology

## 2015-10-06 NOTE — Progress Notes (Signed)
   THERAPIST PROGRESS NOTE  Session Time: 71min  Participation Level: Active  Behavioral Response: CasualAlertAnxious and Irritable  Type of Therapy: Individual Therapy  Treatment Goals addressed: Coping  Interventions: CBT, Motivational Interviewing, Solution Focused, Family Systems and Reframing  Summary: Tracey Perry is a 24 y.o. female who presents with anxiety due to the recent election.  She reports being worried for her life/safety and the way of life of others due to President elect Trump's behavior.  She reports that she was admitted into the emergency room and had an emergency session with Dr. Jimmye Norman (Psychiatrist).  She role played coping skills and was provided stress reduction techniques.   Suicidal/Homicidal: Nowithout intent/plan  Therapist Response: LCSW provided Patient with ongoing emotional support and encouragement.  Normalized her feelings.  Commended Patient on her progress and reinforced the importance of client staying focused on her own strengths and resources and resiliency. Processed various strategies for dealing with stressors.    Plan: Return again in 2 weeks.  Diagnosis: Axis I: Bipolar, mixed    Axis II: No diagnosis    Lubertha South 10/06/2015

## 2015-10-10 ENCOUNTER — Encounter: Payer: Self-pay | Admitting: Psychiatry

## 2015-10-10 ENCOUNTER — Ambulatory Visit (INDEPENDENT_AMBULATORY_CARE_PROVIDER_SITE_OTHER): Payer: 59 | Admitting: Psychiatry

## 2015-10-10 ENCOUNTER — Ambulatory Visit (INDEPENDENT_AMBULATORY_CARE_PROVIDER_SITE_OTHER): Payer: 59 | Admitting: Licensed Clinical Social Worker

## 2015-10-10 VITALS — BP 118/82 | HR 112 | Temp 98.0°F | Ht 65.0 in | Wt 274.6 lb

## 2015-10-10 DIAGNOSIS — F3130 Bipolar disorder, current episode depressed, mild or moderate severity, unspecified: Secondary | ICD-10-CM

## 2015-10-10 DIAGNOSIS — F411 Generalized anxiety disorder: Secondary | ICD-10-CM

## 2015-10-10 NOTE — Progress Notes (Signed)
BH MD/PA/NP OP Progress Note  10/10/2015 11:55 AM Tracey Perry  MRN:  [id number]  Subjective:  Patient returns for follow-up of her polar disorder most recent episode depressed and anxiety. At the last visit we had discontinued her Ativan started her on Klonopin due to her report of increased anxiety and panic attacks after the presidential election. She states the Klonopin has been affected. She states that she could not take the 0.5 mg twice a day because it caused too much sedation so she has been cutting the tablets in half and taking 0.25 mg twice a day. She states that she now has less frequent panic attacks and when she does have them they're less intense.  As overall her mood is good. She states she sometimes reacts to social stressors with crying. For example she states there was a customer that was discussing the presidential election and his happiness with it and she states she kept her composure but then went in the bathroom and cried.   He relates the holidays are not a good time for her and Christmas is particularly hard. When asked why she stated because able expect you to be happy and she does not do well with happy. I discussed ways with her in terms of coping with this since she is aware this is how she reacts to the holidays. She indicated that she enjoys shopping for others and that she surrounds herself around other people that feel the same way about the holidays.  Chief Complaint: good  Visit Diagnosis:     ICD-9-CM ICD-10-CM   1. Bipolar affective disorder, current episode depressed, current episode severity unspecified ([place]) 296.50 F31.30   2. Generalized anxiety disorder 300.02 F41.1     Past Medical History:  Past Medical History  Diagnosis Date  . Herpes simplex without complication   . Vitamin D deficiency   . Morbid obesity due to excess calories ([place])   . Depression   . Allergy   . PMDD (premenstrual dysphoric disorder)   . Vertigo   . Gastroparesis    . Anxiety     Past Surgical History  Procedure Laterality Date  . Cholecystectomy    . Lumbar disc surgery    . Tonsillectomy    . Adenoidectomy    . Tympanostomy tube placement    . Appendectomy     Family History:  Family History  Problem Relation Age of Onset  . Depression Mother   . Hypertension Mother     controlled  . Allergic rhinitis Mother   . Anxiety disorder Mother   . Diabetes Father     controlled  . Hypertension Father   . Allergic rhinitis Father   . Depression Father   . Depression Brother   . Anxiety disorder Brother    Social History:  Social History   Social History  . Marital Status: Single    Spouse Name: N/A  . Number of Children: N/A  . Years of Education: N/A   Social History Main Topics  . Smoking status: Never Smoker   . Smokeless tobacco: Never Used  . Alcohol Use: 0.0 oz/week    0 Standard drinks or equivalent per week     Comment: Occasionally  . Drug Use: No  . Sexual Activity: No   Other Topics Concern  . Not on file   Social History Narrative   Additional History:   Assessment:   Musculoskeletal: Strength & Muscle Tone: within normal limits Gait & Station: normal Patient  leans: N/A  Psychiatric Specialty Exam: Anxiety Symptoms include nervous/anxious behavior (improved on Klonopin). Patient reports no insomnia or suicidal ideas.      Review of Systems  Psychiatric/Behavioral: Negative for depression, suicidal ideas, hallucinations, memory loss and substance abuse. The patient is nervous/anxious (improved on Klonopin). The patient does not have insomnia.   All other systems reviewed and are negative.   There were no vitals taken for this visit.There is no weight on file to calculate BMI.  General Appearance: Well Groomed  Eye Contact:  Good  Speech:  Normal Rate  Volume:  Normal  Mood:  Good  Affect:  Congruent  Thought Process:  Linear and Logical  Orientation:  Full (Time, Place, and Person)  Thought  Content:  Negative  Suicidal Thoughts:  No  Homicidal Thoughts:  No  Memory:  Immediate;   Good Recent;   Good Remote;   Good  Judgement:  Good  Insight:  Good  Psychomotor Activity:  Negative  Concentration:  Good  Recall:  Good  Fund of Knowledge: Good  Language: Good  Akathisia:  Negative  Handed:  Right unknown  AIMS (if indicated):  Not done today  Assets:  Communication Skills Desire for Improvement Vocational/Educational  ADL's:  Intact  Cognition: WNL  Sleep:  good   Is the patient at risk to self?  No. SIB related to emotions, No SI Has the patient been a risk to self in the past 6 months?  Yes.   Has the patient been a risk to self within the distant past?  Yes.   Is the patient a risk to others?  No. Has the patient been a risk to others in the past 6 months?  No. Has the patient been a risk to others within the distant past?  No.  Current Medications: Current Outpatient Prescriptions  Medication Sig Dispense Refill  . albuterol (PROAIR HFA) 108 (90 BASE) MCG/ACT inhaler Inhale into the lungs.    Marland Kitchen buPROPion (WELLBUTRIN XL) 300 MG 24 hr tablet Take 1 tablet (300 mg total) by mouth daily. 30 tablet 2  . cetirizine (ZYRTEC) 10 MG tablet Take by mouth.    . clonazePAM (KLONOPIN) 1 MG tablet Take 1 tablet (1 mg total) by mouth 2 (two) times daily. 60 tablet 0  . fluticasone (FLONASE) 50 MCG/ACT nasal spray 2 sprays by Each Nare route daily.    Marland Kitchen lamoTRIgine (LAMICTAL) 25 MG tablet Take 2 tablets (50 mg total) by mouth 2 (two) times daily. 120 tablet 2  . levonorgestrel (MIRENA, 52 MG,) 20 MCG/24HR IUD by Intrauterine route.    . loratadine (CLARITIN) 10 MG tablet Take by mouth.    . ondansetron (ZOFRAN) 4 MG tablet Take 1 tablet (4 mg total) by mouth every 8 (eight) hours as needed for nausea or vomiting. 20 tablet 0  . valACYclovir (VALTREX) 1000 MG tablet Take 1,000 mg by mouth.    Marland Kitchen VIIBRYD 40 MG TABS Take 1 tablet (40 mg total) by mouth daily. 30 tablet 2   No  current facility-administered medications for this visit.    Medical Decision Making:  Established Problem, Worsening (2), Review of Medication Regimen & Side Effects (2) and Review of New Medication or Change in Dosage (2)  Treatment Plan Summary:Medication management and Plan   Bipolar disorder most recent episode depressed-continue Lamictal 50 mg twice daily. She will continue her Wellbutrin XL 300 mg daily, Viibryd 40 mg daily. She has done well since we discontinued the Saphris at her  last appointment it was causing excessive sedation. She states she has felt her mood is been stable without that medicine. Should there be further need of additional mood stabilization we can assess at her next visits whether to increase her Lamictal or start:Invega, Geodon, Rexulti or Latuda she reports she has not been on in the past.  Anxiety-has improved on the Klonopin. However she's taking 0.25 mg twice a day.   She will follow up in 1 month. She's been encouraged calling questions concerns prior to her next appointment.     Faith Rogue 10/10/2015, 11:55 AM

## 2015-10-11 ENCOUNTER — Encounter: Payer: Self-pay | Admitting: Psychiatry

## 2015-10-11 NOTE — Progress Notes (Signed)
   THERAPIST PROGRESS NOTE  Session Time: 60 min  Participation Level: Active  Behavioral Response: CasualAlertAnxious  Type of Therapy: Individual Therapy  Treatment Goals addressed: Coping  Interventions: CBT, Motivational Interviewing, Solution Focused, Strength-based, Supportive, Family Systems and Reframing  Summary: Tracey Perry is a 24 y.o. female who presents with continued symptoms of her diagnosis. She continues to struggle with the current election and states that her panic attacks have decreased but are still 3-4 times per week. She states crying while at work and she sees someone support president elect Trump.  She states that her anxiety is hereditary. Both parents and brother have mental health diagnosis. States that she is not looking forward to the holiday due to her parents being divorced. She was being Thanksgiving with her father in future stepmother. States that she gets joy. He was accepted with her future stepmother. Poor self-esteem taught self-esteem skills.   Suicidal/Homicidal: Nowithout intent/plan  Therapist Response: LCSW provided Patient with ongoing emotional support and encouragement.  Normalized her feelings.  Commended Patient on her progress and reinforced the importance of client staying focused on her own strengths and resources and resiliency. Processed various strategies for dealing with stressors.    Plan: Return again in 2 weeks.  Diagnosis: Axis I: Bipolar, Manic    Axis II: No diagnosis    Lubertha South 10/11/2015

## 2015-10-24 ENCOUNTER — Ambulatory Visit (INDEPENDENT_AMBULATORY_CARE_PROVIDER_SITE_OTHER): Payer: 59 | Admitting: Licensed Clinical Social Worker

## 2015-10-24 DIAGNOSIS — F411 Generalized anxiety disorder: Secondary | ICD-10-CM

## 2015-10-24 DIAGNOSIS — F3130 Bipolar disorder, current episode depressed, mild or moderate severity, unspecified: Secondary | ICD-10-CM | POA: Diagnosis not present

## 2015-10-25 ENCOUNTER — Encounter: Payer: Self-pay | Admitting: Psychiatry

## 2015-10-25 ENCOUNTER — Ambulatory Visit (INDEPENDENT_AMBULATORY_CARE_PROVIDER_SITE_OTHER): Payer: 59 | Admitting: Psychiatry

## 2015-10-25 VITALS — BP 138/84 | HR 108 | Temp 98.3°F | Ht 65.0 in | Wt 273.6 lb

## 2015-10-25 DIAGNOSIS — F3130 Bipolar disorder, current episode depressed, mild or moderate severity, unspecified: Secondary | ICD-10-CM | POA: Diagnosis not present

## 2015-10-25 DIAGNOSIS — F411 Generalized anxiety disorder: Secondary | ICD-10-CM

## 2015-10-25 DIAGNOSIS — F603 Borderline personality disorder: Secondary | ICD-10-CM | POA: Diagnosis not present

## 2015-10-25 NOTE — Progress Notes (Signed)
BH MD/PA/NP OP Progress Note  10/25/2015 11:08 AM Tracey Perry  MRN:  [id number]  Subjective:  Patient returns for follow-up of her polar disorder most recent episode depressed and anxiety. Patient presents for this unscheduled appointment today. She called today requesting appointment today. Patient states that for the past week she's been feeling depressed. She states that largely it stems from the holiday season. She states that she's been able to do fine at work however when she gets home is when things become a little bit worse for her in regards to her depression. She states that in regards to suicidal ideation she has no intent or plan had passive thoughts. She states that she self harming in that she is cutting for emotional release and last did this 2 days ago. She states she is aware that it is almost approaching her 1 year anniversary from her last hospitalization and she was really looking forward to trying the past that [facility] without getting hospitalized again.  She indicated she do not feel she was a good fit with the therapist in this clinic and now is made an appointment with the previous therapist in this clinic at her new private practice. She indicated that appointment is scheduled for 10/31/2015. I discussed with it seems like she is doing pretty bad and that she reacts to external things with significant depression and mood instability. I did discuss with her that ideally treatment with DBT might be good for her. She states she's never had this type of treatment before. I discussed with her the options would be she could be hospitalized here or wait and see how her next therapy appointment dose. She stated she did not know what to do when she wanted to be told what to do. I did discuss with [place] for hospitalization might be the best thing given that things have been going on for the past week with her depression and self-interest behavior. I did offer that I could try to facilitate  hospitalization at this facility. However patient states that due to financial issues is better if she is hospitalized at [facility]. She states the plans that her mother will take her there.  Chief Complaint: good Chief Complaint    Anxiety; Panic Attack; Depression     Visit Diagnosis:   No diagnosis found.  Past Medical History:  Past Medical History  Diagnosis Date  . Herpes simplex without complication   . Vitamin D deficiency   . Morbid obesity due to excess calories ([place])   . Depression   . Allergy   . PMDD (premenstrual dysphoric disorder)   . Vertigo   . Gastroparesis   . Anxiety     Past Surgical History  Procedure Laterality Date  . Cholecystectomy    . Lumbar disc surgery    . Tonsillectomy    . Adenoidectomy    . Tympanostomy tube placement    . Appendectomy     Family History:  Family History  Problem Relation Age of Onset  . Depression Mother   . Hypertension Mother     controlled  . Allergic rhinitis Mother   . Anxiety disorder Mother   . Diabetes Father     controlled  . Hypertension Father   . Allergic rhinitis Father   . Depression Father   . Depression Brother   . Anxiety disorder Brother    Social History:  Social History   Social History  . Marital Status: Single    Spouse  Name: N/A  . Number of Children: N/A  . Years of Education: N/A   Social History Main Topics  . Smoking status: Never Smoker   . Smokeless tobacco: Never Used  . Alcohol Use: 0.0 oz/week    0 Standard drinks or equivalent per week     Comment: Occasionally  . Drug Use: No  . Sexual Activity: No   Other Topics Concern  . None   Social History Narrative   Additional History:   Assessment:   Musculoskeletal: Strength & Muscle Tone: within normal limits Gait & Station: normal Patient leans: N/A  Psychiatric Specialty Exam: Anxiety Patient reports no insomnia, nervous/anxious behavior or suicidal ideas.    Depression        Associated  symptoms include does not have insomnia and no suicidal ideas.  Past medical history includes anxiety.     Review of Systems  Psychiatric/Behavioral: Positive for depression. Negative for suicidal ideas, hallucinations, memory loss and substance abuse. The patient is not nervous/anxious and does not have insomnia.   All other systems reviewed and are negative.   Blood pressure 138/84, pulse 108, temperature 98.3 F (36.8 C), temperature source Tympanic, height 5\' 5"  (1.651 m), weight 273 lb 9.6 oz (124.104 kg), SpO2 99 %.Body mass index is 45.53 kg/(m^2).  General Appearance: Well Groomed  Eye Contact:  Good  Speech:  Normal Rate  Volume:  Normal  Mood:  Good  Affect:  Congruent  Thought Process:  Linear and Logical  Orientation:  Full (Time, Place, and Person)  Thought Content:  Negative  Suicidal Thoughts:  No  Homicidal Thoughts:  No  Memory:  Immediate;   Good Recent;   Good Remote;   Good  Judgement:  Good  Insight:  Good  Psychomotor Activity:  Negative  Concentration:  Good  Recall:  Good  Fund of Knowledge: Good  Language: Good  Akathisia:  Negative  Handed:  Right unknown  AIMS (if indicated):  Not done today  Assets:  Communication Skills Desire for Improvement Vocational/Educational  ADL's:  Intact  Cognition: WNL  Sleep:  good   Is the patient at risk to self?  No. SIB related to emotions, No SI Has the patient been a risk to self in the past 6 months?  Yes.   Has the patient been a risk to self within the distant past?  Yes.   Is the patient a risk to others?  No. Has the patient been a risk to others in the past 6 months?  No. Has the patient been a risk to others within the distant past?  No.  Current Medications: Current Outpatient Prescriptions  Medication Sig Dispense Refill  . albuterol (PROAIR HFA) 108 (90 BASE) MCG/ACT inhaler Inhale into the lungs.    Marland Kitchen buPROPion (WELLBUTRIN XL) 300 MG 24 hr tablet Take 1 tablet (300 mg total) by mouth daily. 30  tablet 2  . cetirizine (ZYRTEC) 10 MG tablet Take by mouth.    . clonazePAM (KLONOPIN) 1 MG tablet Take 1 tablet (1 mg total) by mouth 2 (two) times daily. 60 tablet 0  . fluticasone (FLONASE) 50 MCG/ACT nasal spray 2 sprays by Each Nare route daily.    Marland Kitchen lamoTRIgine (LAMICTAL) 25 MG tablet Take 2 tablets (50 mg total) by mouth 2 (two) times daily. 120 tablet 2  . levonorgestrel (MIRENA, 52 MG,) 20 MCG/24HR IUD by Intrauterine route.    . loratadine (CLARITIN) 10 MG tablet Take by mouth.    . ondansetron (ZOFRAN)  4 MG tablet Take 1 tablet (4 mg total) by mouth every 8 (eight) hours as needed for nausea or vomiting. 20 tablet 0  . valACYclovir (VALTREX) 1000 MG tablet Take 1,000 mg by mouth.    Marland Kitchen VIIBRYD 40 MG TABS Take 1 tablet (40 mg total) by mouth daily. 30 tablet 2   No current facility-administered medications for this visit.    Medical Decision Making:  Established Problem, Worsening (2), Review of Medication Regimen & Side Effects (2) and Review of New Medication or Change in Dosage (2)  Treatment Plan Summary:Medication management and Plan   Bipolar disorder most recent episode depressed-continue Lamictal 50 mg twice daily. She will continue her Wellbutrin XL 300 mg daily, Viibryd 40 mg daily. She has done well since we discontinued the Saphris at her last appointment it was causing excessive sedation. She states she has felt her mood is been stable without that medicine. Should there be further need of additional mood stabilization we can assess at her next visits whether to increase her Lamictal or start:Invega, Geodon, Rexulti or Latuda she reports she has not been on in the past.  Anxiety-Klonopin 0.25 mg twice a day.  Given the issues may be more related to personality at this time and she is trying to transition back to her previous therapist I recommended the patient presented for hospitalization. She is reporting more depression and no active intent or plans and thus a voluntary  hospitalization is appropriate. His presented in the past for hospitalization on her own accord. I have instructed her to contact me should she have any issues related to presenting to [facility].   She will follow up later this month. She's been encouraged calling questions concerns prior to her next appointment.     Faith Rogue 10/25/2015, 11:08 AM

## 2015-10-26 ENCOUNTER — Telehealth: Payer: Self-pay | Admitting: Psychiatry

## 2015-10-26 NOTE — Progress Notes (Signed)
   THERAPIST PROGRESS NOTE  Session Time: 28min  Participation Level: Active  Behavioral Response: Casual and NeatAlertAnxious and Depressed  Type of Therapy: Individual Therapy  Treatment Goals addressed: Coping  Interventions: CBT, Motivational Interviewing, Solution Focused, Strength-based, Supportive, Family Systems and Reframing  Summary: Tracey Perry is a 24 y.o. female who presents with continued symptoms of her diagnosis.  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 She reports that Thanksgiving holiday was pleasant and is having difficulty with her employer not allowing her to take time off for Christmas.  She reports a depressed mood about not visiting her Grandparents this year.  She reports that her stressors are her brother, her father's upcoming wedding and her not having a dryer in the home. Discussion of coping skills  Suicidal/Homicidal: Nowithout intent/plan  Therapist Response: LCSW provided Patient with ongoing emotional support and encouragement.  Normalized her feelings.  Commended Patient on her progress and reinforced the importance of client staying focused on her own strengths and resources and resiliency. Processed various strategies for dealing with stressors.     Plan: Return again in 2 weeks.  Diagnosis: Axis I: Bipolar, mixed and Generalized Anxiety Disorder    Axis II: No diagnosis    Lubertha South 10/26/2015

## 2015-10-26 NOTE — Telephone Encounter (Signed)
Tracey Perry, a Careers information officer at [employer] contacted Probation officer. She indicated the patient was there in the emergency room. I explained to her that I saw the patient yesterday that she was reporting depression her mood was unstable and that we were in the process of trying to establish a therapist. I explained to Mrs. Tracey Perry that the patient had engaged in some self-injurious behavior 3 days ago and that she had passive suicidal ideation. Ms. Tracey Perry indicated that they would evaluate the patient for admission but she was not sure the patient would meet the minimum criteria.

## 2015-10-27 ENCOUNTER — Telehealth: Payer: Self-pay | Admitting: Psychiatry

## 2015-10-27 DIAGNOSIS — F6089 Other specific personality disorders: Secondary | ICD-10-CM | POA: Insufficient documentation

## 2015-10-27 DIAGNOSIS — F39 Unspecified mood [affective] disorder: Secondary | ICD-10-CM | POA: Insufficient documentation

## 2015-10-27 NOTE — Telephone Encounter (Signed)
Received a call from [facility] emergency room. Medical student Joanne Gavel relate that patient did not want to be admitted and they did not believe they have criteria to commit the patient. She inquired about perhaps trying to address the patient's anxiety. I did explain to them that I felt that would be okay for them to do so. Tracey Perry discussed they were thinking about starting some Neurontin. I did indicate that might be a reasonable option however I did feel like some of what we are seeing is more related to personality traits and coping. I indicated that if there are discharge her she could speak with the receptionist; and to get the earliest available follow-up appointment. AW

## 2015-11-03 ENCOUNTER — Telehealth: Payer: Self-pay | Admitting: Psychiatry

## 2015-11-03 NOTE — Telephone Encounter (Signed)
Patient was inpatient at  [facility] from 10/25/2015 through 10/31/2015. Patient dropped off disability paperwork from [employer] earlier this week. I contacted patient to clarify her discharge medications from [place] [facility]. The discharge summary did include gabapentin. However patient Tracey Perry she never started that medication. She indicated they did give her hydroxyzine 25 mg twice a day which was not on the discharge summary. It appears they increased her Lamictal to 50 Milligan's in the morning and 100 mg daily at bedtime. They continued her Wellbutrin XL 300 mg. They continue her vibrated 40 mg and increased her Klonopin to 1 mg twice a day as needed. She stated that now she has been with her mother. The discharge summary also included referral information for [facility] and [place] for Dialectical Behavioral Therapy skills. I indicated that I would complete the paperwork for short-term disability but I wanted to see how she stood with following up on obtaining DBT skills because I informed patient that once she has the skills that do believe she should make an attempt to return to work once she has been in this treatment. Patient case she had not contacted those organizations yet. I instructed to contact those organizations and inform me of her progress in terms of enrolling in treatment. Patient took the number for the mood treatment center and indicated she would contact this Probation officer. AW

## 2015-11-07 ENCOUNTER — Ambulatory Visit: Payer: Commercial Managed Care - PPO | Admitting: Psychiatry

## 2015-11-07 ENCOUNTER — Ambulatory Visit: Payer: 59 | Admitting: Licensed Clinical Social Worker

## 2015-11-10 ENCOUNTER — Encounter: Payer: Self-pay | Admitting: Psychiatry

## 2015-11-10 ENCOUNTER — Ambulatory Visit (INDEPENDENT_AMBULATORY_CARE_PROVIDER_SITE_OTHER): Payer: 59 | Admitting: Psychiatry

## 2015-11-10 VITALS — BP 122/78 | HR 88 | Temp 98.5°F | Ht 65.0 in | Wt 275.8 lb

## 2015-11-10 DIAGNOSIS — F332 Major depressive disorder, recurrent severe without psychotic features: Secondary | ICD-10-CM

## 2015-11-10 DIAGNOSIS — F411 Generalized anxiety disorder: Secondary | ICD-10-CM | POA: Diagnosis not present

## 2015-11-10 DIAGNOSIS — F603 Borderline personality disorder: Secondary | ICD-10-CM | POA: Diagnosis not present

## 2015-11-10 MED ORDER — CLONAZEPAM 1 MG PO TABS: 1.0000 mg | ORAL_TABLET | Freq: Two times a day (BID) | ORAL | Status: DC

## 2015-11-10 NOTE — Progress Notes (Signed)
BH MD/PA/NP OP Progress Note  11/10/2015 11:07 AM Tracey Perry  MRN:  [id number]  Subjective:  Patient returns for follow-up of her generalized anxiety disorder, borderline personality disorder and major depressive disorder. He does appear now given behaviors and several visits that the mood fluctuation may be more indicative of a personality disorder as opposed to bipolar disorder. Patient after the last session did go to [facility] for hospitalization. However discussion with the staff there indicated the patient was resistant to being hospitalized despite that being our plan at the appointment. I discussed with patient and she indicated that the reason she was resistant to hospitalization as she felt it was a sign of failure. She indicates she felt this way because she was hospitalized this time she year. However states this hospitalization was different because it was due to anxiety whereas in the hospitalization last year she had active suicidal ideation. She states in hindsight now she knows she did need to be in the hospital but "at the time" it seemed like a reasonable thing to be resistant to it. There they changed her mental dose to 1 drink 50 mg once a day and they added hydroxyzine for anxiety. Patient does feel the hydroxyzine has been helpful for anxiety.  She states right now she feels stable secondary to her medications of hydroxyzine and Klonopin which are addressing her anxiety. In regards to work I had completed some short-term disability taking her out from work until March. However she states her boss is not going to accept her back without a letter approving her for work. He states she did follow-up on the DBT groups that were listed on her discharge summary but one of them does not take her insurance and the other she has arty left a voicemail with them. I have gone ahead and contacted Dr. Armandina Gemma in [facility] to see if that is an option for her DBT group. Patient is open to this.  She had an appointment with her therapist Miguel Dibble, LCSW earlier this week but had to cancel it because of being sick. Patient states she is recovering from strep throat.  Chief Complaint: good Chief Complaint    Follow-up; Medication Refill     Visit Diagnosis:     ICD-9-CM ICD-10-CM   1. Generalized anxiety disorder 300.02 F41.1   2. Borderline personality disorder 301.83 F60.3   3. Major depressive disorder, recurrent, severe without psychotic features ([place]) 296.33 F33.2     Past Medical History:  Past Medical History  Diagnosis Date  . Herpes simplex without complication   . Vitamin D deficiency   . Morbid obesity due to excess calories ([place])   . Depression   . Allergy   . PMDD (premenstrual dysphoric disorder)   . Vertigo   . Gastroparesis   . Anxiety     Past Surgical History  Procedure Laterality Date  . Cholecystectomy    . Lumbar disc surgery    . Tonsillectomy    . Adenoidectomy    . Tympanostomy tube placement    . Appendectomy     Family History:  Family History  Problem Relation Age of Onset  . Depression Mother   . Hypertension Mother     controlled  . Allergic rhinitis Mother   . Anxiety disorder Mother   . Diabetes Father     controlled  . Hypertension Father   . Allergic rhinitis Father   . Depression Father   . Depression Brother   . Anxiety  disorder Brother    Social History:  Social History   Social History  . Marital Status: Single    Spouse Name: N/A  . Number of Children: N/A  . Years of Education: N/A   Social History Main Topics  . Smoking status: Never Smoker   . Smokeless tobacco: Never Used  . Alcohol Use: 0.0 oz/week    0 Standard drinks or equivalent per week     Comment: Occasionally  . Drug Use: No  . Sexual Activity: No   Other Topics Concern  . None   Social History Narrative   Additional History:   Assessment:   Musculoskeletal: Strength & Muscle Tone: within normal limits Gait & Station:  normal Patient leans: N/A  Psychiatric Specialty Exam: Anxiety Symptoms include nervous/anxious behavior (reports it is well controlled under the regimen of Klonopin or hydroxyzine). Patient reports no insomnia or suicidal ideas.    Depression        Associated symptoms include does not have insomnia and no suicidal ideas.  Past medical history includes anxiety.     Review of Systems  Psychiatric/Behavioral: Negative for depression, suicidal ideas, hallucinations, memory loss and substance abuse. The patient is nervous/anxious (reports it is well controlled under the regimen of Klonopin or hydroxyzine). The patient does not have insomnia.   All other systems reviewed and are negative.   Blood pressure 122/78, pulse 88, temperature 98.5 F (36.9 C), temperature source Tympanic, height 5\' 5"  (1.651 m), weight 275 lb 12.8 oz (125.102 kg), SpO2 99 %.Body mass index is 45.9 kg/(m^2).  General Appearance: Well Groomed  Eye Contact:  Good  Speech:  Normal Rate  Volume:  Normal  Mood:  Good  Affect:  Congruent  Thought Process:  Linear and Logical  Orientation:  Full (Time, Place, and Person)  Thought Content:  Negative  Suicidal Thoughts:  No  Homicidal Thoughts:  No  Memory:  Immediate;   Good Recent;   Good Remote;   Good  Judgement:  Good  Insight:  Good  Psychomotor Activity:  Negative  Concentration:  Good  Recall:  Good  Fund of Knowledge: Good  Language: Good  Akathisia:  Negative  Handed:  Right unknown  AIMS (if indicated):  Not done today  Assets:  Communication Skills Desire for Improvement Vocational/Educational  ADL's:  Intact  Cognition: WNL  Sleep:  good   Is the patient at risk to self?  No. SIB related to emotions, No SI Has the patient been a risk to self in the past 6 months?  Yes.   Has the patient been a risk to self within the distant past?  Yes.   Is the patient a risk to others?  No. Has the patient been a risk to others in the past 6 months?   No. Has the patient been a risk to others within the distant past?  No.  Current Medications: Current Outpatient Prescriptions  Medication Sig Dispense Refill  . buPROPion (WELLBUTRIN XL) 300 MG 24 hr tablet Take 1 tablet (300 mg total) by mouth daily. 30 tablet 2  . cetirizine (ZYRTEC) 10 MG tablet Take by mouth.    . clonazePAM (KLONOPIN) 1 MG tablet Take 1 tablet (1 mg total) by mouth 2 (two) times daily. 60 tablet 4  . hydrOXYzine (ATARAX/VISTARIL) 25 MG tablet TAKE 1 TABLET BY MOUTH TWICE A DAY AS NEEDED FOR ANXIETY  0  . lamoTRIgine (LAMICTAL) 150 MG tablet Take 150 mg by mouth.    . levonorgestrel (  MIRENA, 52 MG,) 20 MCG/24HR IUD by Intrauterine route.    Marland Kitchen VIIBRYD 40 MG TABS Take 1 tablet (40 mg total) by mouth daily. 30 tablet 2   No current facility-administered medications for this visit.    Medical Decision Making:  Established Problem, Worsening (2), Review of Medication Regimen & Side Effects (2) and Review of New Medication or Change in Dosage (2)  Treatment Plan Summary:Medication management and Plan   Major depressive disorder, recurrent-continue Lamictal 150 mg daily. She will continue her Wellbutrin XL 300 mg daily, Viibryd 40 mg daily.   Generalized Anxiety disorder-Klonopin 1 mg twice a day and hydroxyzine 25 mg twice daily as needed.  Continue to search for referrals for DBT. I left a message for Dr. Armandina Gemma in [facility] to see whether her group is an option or if she knows of any other groups [place].  She will follow up later this month. Patient is aware of my departure from this clinic in February 2017. She is aware she'll follow up with another provider within the practice. She's been encouraged calling questions concerns prior to her next appointment.     Faith Rogue 11/10/2015, 11:07 AM

## 2015-11-14 NOTE — Progress Notes (Signed)
According to last note, pt still [place]

## 2015-11-27 ENCOUNTER — Ambulatory Visit: Payer: 59 | Admitting: Psychiatry

## 2015-11-29 ENCOUNTER — Ambulatory Visit (INDEPENDENT_AMBULATORY_CARE_PROVIDER_SITE_OTHER): Payer: 59 | Admitting: Psychiatry

## 2015-11-29 ENCOUNTER — Encounter: Payer: Self-pay | Admitting: Psychiatry

## 2015-11-29 VITALS — BP 120/82 | HR 134 | Temp 97.9°F | Ht 65.0 in | Wt 272.2 lb

## 2015-11-29 DIAGNOSIS — F411 Generalized anxiety disorder: Secondary | ICD-10-CM

## 2015-11-29 DIAGNOSIS — F332 Major depressive disorder, recurrent severe without psychotic features: Secondary | ICD-10-CM | POA: Diagnosis not present

## 2015-11-29 MED ORDER — CLONAZEPAM 0.5 MG PO TABS: 0.5000 mg | ORAL_TABLET | Freq: Two times a day (BID) | ORAL | Status: AC

## 2015-11-29 MED ORDER — LAMOTRIGINE 200 MG PO TABS: 200.0000 mg | ORAL_TABLET | Freq: Every day | ORAL | Status: DC

## 2015-11-29 NOTE — Progress Notes (Signed)
BH MD/PA/NP OP Progress Note  11/29/2015 10:50 AM Tracey Perry  MRN:  [id number]  Subjective:  Patient returns for follow-up of her generalized anxiety disorder, borderline personality disorder and major depressive disorder. She states since her last visit about 2 weeks ago she is gone from being an anxious state to a now depressed state. She states she has anhedonia, not wanting to get out of bed, feeling sad, crying episodes. We explored any issues with suicidal ideation she stated that part is always there and she has had it a few times since her last meeting. I asked her when she last had it and she states she really could not relate a particular time. She stated it somewhat always there and her mind. Were with her what keeps her from acting on her suicidal ideation and she stated she has a group of friends at all have depression and they have had some type of pattern that if one does it would have an affect on the others and thus they avoided. She states also she has too much apathy and does not want to put forth the effort. We discussed ways to utilize her current medication regimen to address this. We decided we would not increase her Wellbutrin because in the past it is caused her anxiety at higher doses. We did discuss perhaps increasing her Lamictal for mood stability as well as helping with depressed mood. Patient is agreeable with this. She indicates she would actually like a lower dose of clonazepam. She states there was one day where she did not take any of her medications and she felt her mood was better. I discussed that it's hard to differentiate whether one medication may be causing her some flattening of affect or decreased energy. She stated she felt might be her clonazepam that might make her too tired and drained. She requested decreased dose.  He did see that her former therapist that was in this clinic at her new office. However she states that due to insurance to deductibles  she's not sure when she is going to see that therapist again. I did discuss the need and my recommendation that she engage in DBT. I provided her with a website which lists multiple providers in the area and strongly encourage her to poor finding one.  Chief Complaint: good Chief Complaint    Follow-up; Medication Refill     Visit Diagnosis:     ICD-9-CM ICD-10-CM   1. Generalized anxiety disorder 300.02 F41.1   2. Major depressive disorder, recurrent, severe without psychotic features ([place]) 296.33 F33.2     Past Medical History:  Past Medical History  Diagnosis Date  . Herpes simplex without complication   . Vitamin D deficiency   . Morbid obesity due to excess calories ([place])   . Depression   . Allergy   . PMDD (premenstrual dysphoric disorder)   . Vertigo   . Gastroparesis   . Anxiety     Past Surgical History  Procedure Laterality Date  . Cholecystectomy    . Lumbar disc surgery    . Tonsillectomy    . Adenoidectomy    . Tympanostomy tube placement    . Appendectomy     Family History:  Family History  Problem Relation Age of Onset  . Depression Mother   . Hypertension Mother     controlled  . Allergic rhinitis Mother   . Anxiety disorder Mother   . Diabetes Father     controlled  . Hypertension  Father   . Allergic rhinitis Father   . Depression Father   . Depression Brother   . Anxiety disorder Brother    Social History:  Social History   Social History  . Marital Status: Single    Spouse Name: N/A  . Number of Children: N/A  . Years of Education: N/A   Social History Main Topics  . Smoking status: Never Smoker   . Smokeless tobacco: Never Used  . Alcohol Use: 0.0 oz/week    0 Standard drinks or equivalent per week     Comment: Occasionally  . Drug Use: No  . Sexual Activity: No   Other Topics Concern  . None   Social History Narrative   Additional History:   Assessment:   Musculoskeletal: Strength & Muscle Tone: within normal  limits Gait & Station: normal Patient leans: N/A  Psychiatric Specialty Exam: Anxiety Patient reports no insomnia, nervous/anxious behavior or suicidal ideas.    Depression        Associated symptoms include does not have insomnia and no suicidal ideas.  Past medical history includes anxiety.     Review of Systems  Psychiatric/Behavioral: Positive for depression. Negative for suicidal ideas, hallucinations, memory loss and substance abuse. The patient is not nervous/anxious and does not have insomnia.   All other systems reviewed and are negative.   Blood pressure 120/82, pulse 134, temperature 97.9 F (36.6 C), temperature source Tympanic, height 5\' 5"  (1.651 m), weight 272 lb 3.2 oz (123.469 kg), SpO2 97 %.Body mass index is 45.3 kg/(m^2).  General Appearance: Well Groomed  Eye Contact:  Good  Speech:  Normal Rate  Volume:  Normal  Mood:  Good  Affect:  Congruent  Thought Process:  Linear and Logical  Orientation:  Full (Time, Place, and Person)  Thought Content:  Negative  Suicidal Thoughts:  No  Homicidal Thoughts:  No  Memory:  Immediate;   Good Recent;   Good Remote;   Good  Judgement:  Good  Insight:  Good  Psychomotor Activity:  Negative  Concentration:  Good  Recall:  Good  Fund of Knowledge: Good  Language: Good  Akathisia:  Negative  Handed:  Right unknown  AIMS (if indicated):  Not done today  Assets:  Communication Skills Desire for Improvement Vocational/Educational  ADL's:  Intact  Cognition: WNL  Sleep:  good   Is the patient at risk to self?  No. SIB related to emotions, No SI Has the patient been a risk to self in the past 6 months?  Yes.   Has the patient been a risk to self within the distant past?  Yes.   Is the patient a risk to others?  No. Has the patient been a risk to others in the past 6 months?  No. Has the patient been a risk to others within the distant past?  No.  Current Medications: Current Outpatient Prescriptions  Medication  Sig Dispense Refill  . buPROPion (WELLBUTRIN XL) 300 MG 24 hr tablet Take 1 tablet (300 mg total) by mouth daily. 30 tablet 2  . cetirizine (ZYRTEC) 10 MG tablet Take by mouth.    . clonazePAM (KLONOPIN) 0.5 MG tablet Take 1 tablet (0.5 mg total) by mouth 2 (two) times daily. 60 tablet 4  . hydrOXYzine (ATARAX/VISTARIL) 25 MG tablet TAKE 1 TABLET BY MOUTH TWICE A DAY AS NEEDED FOR ANXIETY  0  . levonorgestrel (MIRENA, 52 MG,) 20 MCG/24HR IUD by Intrauterine route.    Marland Kitchen VIIBRYD 40 MG TABS  Take 1 tablet (40 mg total) by mouth daily. 30 tablet 2  . lamoTRIgine (LAMICTAL) 200 MG tablet Take 1 tablet (200 mg total) by mouth daily. 30 tablet 4   No current facility-administered medications for this visit.    Medical Decision Making:  Established Problem, Worsening (2), Review of Medication Regimen & Side Effects (2) and Review of New Medication or Change in Dosage (2)  Treatment Plan Summary:Medication management and Plan   Major depressive disorder, recurrent-continue Lamictal 150 mg daily. She will continue her Wellbutrin XL 300 mg daily, Viibryd 40 mg daily.   Generalized Anxiety disorder-decrease  Klonopin from 1 mg twice a day to 0.5 mg twice daily. Patient has hydroxyzine 25 mg twice daily as needed. Patient states she really has not been using her hydroxyzine much because it just puts her to sleep. Thus we will not give another prescription of hydroxyzine at this time.  Continue to search for referrals for DBT. As discussed above I have given patient a web site with information for DBT providers.  She will follow up in 3 to 4 weeks. Patient is aware of my departure from this clinic in February 2017. She is aware she'll follow up with another provider within the practice. She's been encouraged calling questions concerns prior to her next appointment.     Faith Rogue 11/29/2015, 10:50 AM

## 2015-11-29 NOTE — Patient Instructions (Signed)
Review [email or web address] for DBT therapist.

## 2015-12-05 ENCOUNTER — Telehealth: Payer: Self-pay | Admitting: Psychiatry

## 2015-12-11 ENCOUNTER — Ambulatory Visit: Payer: 59 | Admitting: Psychiatry

## 2015-12-12 ENCOUNTER — Ambulatory Visit: Payer: 59 | Admitting: Psychiatry

## 2015-12-20 ENCOUNTER — Encounter: Payer: Self-pay | Admitting: Psychiatry

## 2015-12-20 ENCOUNTER — Ambulatory Visit (INDEPENDENT_AMBULATORY_CARE_PROVIDER_SITE_OTHER): Payer: 59 | Admitting: Psychiatry

## 2015-12-20 VITALS — BP 124/76 | HR 103 | Temp 97.8°F | Ht 65.0 in | Wt 270.0 lb

## 2015-12-20 DIAGNOSIS — F603 Borderline personality disorder: Secondary | ICD-10-CM | POA: Diagnosis not present

## 2015-12-20 DIAGNOSIS — F332 Major depressive disorder, recurrent severe without psychotic features: Secondary | ICD-10-CM

## 2015-12-20 NOTE — Progress Notes (Signed)
BH MD/PA/NP OP Progress Note  12/20/2015 11:22 AM Tracey Perry  MRN:  [id number]  Subjective:  Patient returns for follow-up of her generalized anxiety disorder, borderline personality disorder and major depressive disorder. Today she states overall she is doing well with the exception of particular instances. She states for example if something goes wrong such as she is unable to get a particular task done she can burst into tears and that will last for hours. She states the other problem she's having his excessive sleep over the past week and a half. We did review her history and she indicated that she was diagnosed with sleep apnea when she was young. She states that this issue with excessive sleepiness, in waves. She states that before this past week and a half it occurred in November for a couple months. I discussed that this could be a result of sleep apnea or perhaps sleep disturbance related to the mood and depression.  He did follow with the patient and regards to any progress on getting DBT therapy. She states she has a class that will begin February 4 and will be ongoing for 6 months.  Chief Complaint: good untill something happens Chief Complaint    Follow-up; Medication Refill; Anxiety; Depression     Visit Diagnosis:     ICD-9-CM ICD-10-CM   1. Major depressive disorder, recurrent, severe without psychotic features ([place]) 296.33 F33.2   2. Borderline personality disorder 301.83 F60.3     Past Medical History:  Past Medical History  Diagnosis Date  . Herpes simplex without complication   . Vitamin D deficiency   . Morbid obesity due to excess calories ([place])   . Depression   . Allergy   . PMDD (premenstrual dysphoric disorder)   . Vertigo   . Gastroparesis   . Anxiety     Past Surgical History  Procedure Laterality Date  . Cholecystectomy    . Lumbar disc surgery    . Tonsillectomy    . Adenoidectomy    . Tympanostomy tube placement    . Appendectomy      Family History:  Family History  Problem Relation Age of Onset  . Depression Mother   . Hypertension Mother     controlled  . Allergic rhinitis Mother   . Anxiety disorder Mother   . Diabetes Father     controlled  . Hypertension Father   . Allergic rhinitis Father   . Depression Father   . Depression Brother   . Anxiety disorder Brother    Social History:  Social History   Social History  . Marital Status: Single    Spouse Name: N/A  . Number of Children: N/A  . Years of Education: N/A   Social History Main Topics  . Smoking status: Never Smoker   . Smokeless tobacco: Never Used  . Alcohol Use: 0.0 oz/week    0 Standard drinks or equivalent per week     Comment: Occasionally  . Drug Use: No  . Sexual Activity: No   Other Topics Concern  . None   Social History Narrative   Additional History:   Assessment:   Musculoskeletal: Strength & Muscle Tone: within normal limits Gait & Station: normal Patient leans: N/A  Psychiatric Specialty Exam: Anxiety Patient reports no insomnia, nervous/anxious behavior or suicidal ideas.    Depression        Associated symptoms include does not have insomnia and no suicidal ideas.  Past medical history includes anxiety.  Review of Systems  Psychiatric/Behavioral: Positive for depression (It is now episodic and she tends to experience depression and crying fits when she is exposed to stress or problems.). Negative for suicidal ideas, hallucinations, memory loss and substance abuse. The patient is not nervous/anxious and does not have insomnia.   All other systems reviewed and are negative.   Blood pressure 124/76, pulse 103, temperature 97.8 F (36.6 C), temperature source Tympanic, height 5\' 5"  (1.651 m), weight 270 lb (122.471 kg), SpO2 98 %.Body mass index is 44.93 kg/(m^2).  General Appearance: Well Groomed  Eye Contact:  Good  Speech:  Normal Rate  Volume:  Normal  Mood:  Good  Affect:  Congruent  Thought  Process:  Linear and Logical  Orientation:  Full (Time, Place, and Person)  Thought Content:  Negative  Suicidal Thoughts:  No  Homicidal Thoughts:  No  Memory:  Immediate;   Good Recent;   Good Remote;   Good  Judgement:  Good  Insight:  Good  Psychomotor Activity:  Negative  Concentration:  Good  Recall:  Good  Fund of Knowledge: Good  Language: Good  Akathisia:  Negative  Handed:  Right unknown  AIMS (if indicated):  Not done today  Assets:  Communication Skills Desire for Improvement Vocational/Educational  ADL's:  Intact  Cognition: WNL  Sleep:  good   Is the patient at risk to self?  No. SIB related to emotions, No SI Has the patient been a risk to self in the past 6 months?  Yes.   Has the patient been a risk to self within the distant past?  Yes.   Is the patient a risk to others?  No. Has the patient been a risk to others in the past 6 months?  No. Has the patient been a risk to others within the distant past?  No.  Current Medications: Current Outpatient Prescriptions  Medication Sig Dispense Refill  . buPROPion (WELLBUTRIN XL) 300 MG 24 hr tablet Take 1 tablet (300 mg total) by mouth daily. 30 tablet 2  . cetirizine (ZYRTEC) 10 MG tablet Take by mouth.    . clonazePAM (KLONOPIN) 0.5 MG tablet Take 1 tablet (0.5 mg total) by mouth 2 (two) times daily. 60 tablet 4  . hydrOXYzine (ATARAX/VISTARIL) 25 MG tablet TAKE 1 TABLET BY MOUTH TWICE A DAY AS NEEDED FOR ANXIETY  0  . lamoTRIgine (LAMICTAL) 200 MG tablet Take 1 tablet (200 mg total) by mouth daily. 30 tablet 4  . levonorgestrel (MIRENA, 52 MG,) 20 MCG/24HR IUD by Intrauterine route.    Marland Kitchen VIIBRYD 40 MG TABS Take 1 tablet (40 mg total) by mouth daily. 30 tablet 2   No current facility-administered medications for this visit.    Medical Decision Making:  Established Problem, Worsening (2), Review of Medication Regimen & Side Effects (2) and Review of New Medication or Change in Dosage (2)  Treatment Plan  Summary:Medication management and Plan   Major depressive disorder, recurrent-continue Lamictal 150 mg daily. She will continue her Wellbutrin XL 300 mg daily, Viibryd 40 mg daily.   Generalized Anxiety disorder-decrease  Klonopin from 1 mg twice a day to 0.5 mg twice daily. Patient has hydroxyzine 25 mg twice daily as needed. Patient states she really has not been using her hydroxyzine much because it just puts her to sleep. Thus we will not give another prescription of hydroxyzine at this time.  Borderline personality disorder-Continue to search for referrals for DBT. As discussed above I have given patient  a web site with information for DBT providers. I do recommend patient get some experience with DBT prior to returning from work. Given her history she becomes quickly depressed, tearful and anxious with minor stressors and I think she would not be able to perform even basic simple task under stress without some DBT skills. She indicates that her disability agency has stated leave from work with him today. She is going to bring by documentation and I'm going to make the recommendation that she be out of work for an additional 3 months or she can benefit from [place] training.  She was also enrolled with an individual therapist and sees her every 2 weeks.  She will follow up in 3 weeks. Patient is already aware of my departure from this clinic in February 2017. She is aware she'll follow up with another provider within the practice. She's been encouraged calling questions concerns prior to her next appointment.     Faith Rogue 12/20/2015, 11:22 AM

## 2015-12-25 ENCOUNTER — Encounter: Payer: Self-pay | Admitting: Family Medicine

## 2015-12-25 ENCOUNTER — Ambulatory Visit (INDEPENDENT_AMBULATORY_CARE_PROVIDER_SITE_OTHER): Payer: Commercial Managed Care - PPO | Admitting: Family Medicine

## 2015-12-25 DIAGNOSIS — F331 Major depressive disorder, recurrent, moderate: Secondary | ICD-10-CM

## 2015-12-25 DIAGNOSIS — Z23 Encounter for immunization: Secondary | ICD-10-CM

## 2015-12-25 DIAGNOSIS — R0683 Snoring: Secondary | ICD-10-CM | POA: Insufficient documentation

## 2015-12-25 DIAGNOSIS — G4710 Hypersomnia, unspecified: Secondary | ICD-10-CM | POA: Insufficient documentation

## 2015-12-25 NOTE — Progress Notes (Signed)
Name: Tracey Perry   MRN: [id number]    DOB: October 12, 1991   Date:12/25/2015       Progress Note  Subjective  Chief Complaint  Chief Complaint  Patient presents with  . Sleep Issues    Was told to see PCP due to sleeping all day or night then getting up and feeling like she has not slept at all    HPI  Hypersomnia/Snoring: she had tonsillectomy at age 25 for sleep apnea. She states over the past few years she has noticed episodes of severe hypersomnolence, but worse over the past month. She states she has been sleeping 15-16 hours per day. She has a history of bipolar and is in major depression at this time, however her psychiatrist does not think her symptoms are secondary to her medication and advised her to see me. She wakes up feeling tired.   Major Depression/Bipolar/Anxiety: admitted to [facility] psychiatry in December 2016, she had some medication adjustment while there, still very depressed. She still does not have energy, does not want to eat, sleeping most of the day, prefers not to socialize. Lost her job, she is on short term disability    Patient Active Problem List   Diagnosis Date Noted  . Hypersomnia 12/25/2015  . Snoring 12/25/2015  . Cluster B personality disorder 10/27/2015  . Episodic mood disorder ([place]) 10/27/2015  . H/O urinary tract infection 06/21/2015  . 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 Affective disorder ([place]) 06/21/2015  . History of migraine headaches 06/21/2015  . Depression, major, recurrent, moderate ([place]) 06/21/2015  . Gastroparesis 06/21/2015  . Hx of cold sores 06/21/2015  . Bipolar affective disorder ([place]) 05/31/2015  . Generalized anxiety disorder 05/31/2015  . History of pyelonephritis 05/30/2015  . Dysmenorrhea 05/30/2015  . History of asthma 05/30/2015  . Migraine without aura and responsive to treatment 05/30/2015  . Extreme obesity ([place]) 05/30/2015  . Allergic rhinitis, seasonal 05/30/2015  . Vertigo 05/30/2015  . IBS (irritable bowel  syndrome) 09/20/2013  . H/O cervical spine surgery 12/29/2012  . Vitamin D deficiency 11/23/2009  . History of mononucleosis 04/03/2009    Past Surgical History  Procedure Laterality Date  . Cholecystectomy    . Lumbar disc surgery    . Tonsillectomy    . Adenoidectomy    . Tympanostomy tube placement    . Appendectomy      Family History  Problem Relation Age of Onset  . Depression Mother   . Hypertension Mother     controlled  . Allergic rhinitis Mother   . Anxiety disorder Mother   . Diabetes Father     controlled  . Hypertension Father   . Allergic rhinitis Father   . Depression Father   . Depression Brother   . Anxiety disorder Brother     Social History   Social History  . Marital Status: Single    Spouse Name: N/A  . Number of Children: N/A  . Years of Education: N/A   Occupational History  . Not on file.   Social History Main Topics  . Smoking status: Never Smoker   . Smokeless tobacco: Never Used  . Alcohol Use: 0.0 oz/week    0 Standard drinks or equivalent per week     Comment: Occasionally  . Drug Use: No  . Sexual Activity: No   Other Topics Concern  . Not on file   Social History Narrative     Current outpatient prescriptions:  .  buPROPion (WELLBUTRIN XL) 300 MG  24 hr tablet, Take 1 tablet (300 mg total) by mouth daily., Disp: 30 tablet, Rfl: 2 .  cetirizine (ZYRTEC) 10 MG tablet, Take by mouth., Disp: , Rfl:  .  clonazePAM (KLONOPIN) 0.5 MG tablet, Take 1 tablet (0.5 mg total) by mouth 2 (two) times daily., Disp: 60 tablet, Rfl: 4 .  hydrOXYzine (ATARAX/VISTARIL) 25 MG tablet, TAKE 1 TABLET BY MOUTH TWICE A DAY AS NEEDED FOR ANXIETY, Disp: , Rfl: 0 .  lamoTRIgine (LAMICTAL) 200 MG tablet, Take 1 tablet (200 mg total) by mouth daily., Disp: 30 tablet, Rfl: 4 .  levonorgestrel (MIRENA, 52 MG,) 20 MCG/24HR IUD, by Intrauterine route., Disp: , Rfl:  .  VIIBRYD 40 MG TABS, Take 1 tablet (40 mg total) by mouth daily., Disp: 30 tablet, Rfl:  2  Allergies  Allergen Reactions  . Nutritional Supplements Itching and Swelling    ONLY certain "FRESH FRUITS" cause throat itches & in spring, throat feels like it will swell.  . Prednisone Other (See Comments) and Palpitations    Severe mood swings and tachycardia     ROS  Ten systems reviewed and is negative except as mentioned in HPI   Objective  Filed Vitals:   12/25/15 1141  BP: 110/70  Pulse: 105  Temp: 98.1 F (36.7 C)  TempSrc: Oral  Resp: 16  Height: 5\' 5"  (1.651 m)  Weight: 273 lb 3.2 oz (123.923 kg)  SpO2: 97%    Body mass index is 45.46 kg/(m^2).  Physical Exam  Constitutional: Patient appears well-developed  Obese  No distress.  HEENT: head atraumatic, normocephalic, pupils equal and reactive to light, neck supple, throat within normal limits Cardiovascular: Normal rate, regular rhythm and normal heart sounds.  No murmur heard. No BLE edema. Pulmonary/Chest: Effort normal and breath sounds normal. No respiratory distress. Abdominal: Soft.  There is no tenderness. Psychiatric: Patient has a normal mood and affect. behavior is normal. Judgment and thought content normal.  PHQ2/9: Depression screen [facility] 2/9 12/25/2015 05/30/2015  Decreased Interest 0 0  Down, Depressed, Hopeless 0 1  PHQ - 2 Score 0 1  Some encounter information is confidential and restricted. Go to Review Flowsheets activity to see all data.     Fall Risk: Fall Risk  12/25/2015 05/30/2015  Falls in the past year? No No    Functional Status Survey: Is the patient deaf or have difficulty hearing?: No Does the patient have difficulty seeing, even when wearing glasses/contacts?: No Does the patient have difficulty concentrating, remembering, or making decisions?: No Does the patient have difficulty walking or climbing stairs?: No Does the patient have difficulty dressing or bathing?: No Does the patient have difficulty doing errands alone such as visiting a doctor's office or shopping?:  No   Assessment & Plan  1. Depression, major, recurrent, moderate ([place])  Continue follow up with Psychiatrist  2. Morbid obesity, unspecified obesity type [facility])  Discussed with the patient the risk posed by an increased BMI. Discussed importance of portion control, calorie counting and at least 150 minutes of physical activity weekly. Avoid sweet beverages and drink more water. Eat at least 6 servings of fruit and vegetables daily . She is depressed now and not motivated to eat anyways at this time  3. Hypersomnia  - Nocturnal polysomnography (NPSG); Future ESS 13   4. Snoring  - Nocturnal polysomnography (NPSG); Future

## 2015-12-26 ENCOUNTER — Telehealth: Payer: Self-pay

## 2015-12-26 NOTE — Telephone Encounter (Signed)
Patient was informed that she has only had a IUD put in with [facility] and not a PAP. She stated that she will schedule an appt for one in a couple of months.

## 2016-01-10 ENCOUNTER — Encounter: Payer: Self-pay | Admitting: Psychiatry

## 2016-01-10 ENCOUNTER — Ambulatory Visit (INDEPENDENT_AMBULATORY_CARE_PROVIDER_SITE_OTHER): Payer: 59 | Admitting: Psychiatry

## 2016-01-10 ENCOUNTER — Ambulatory Visit: Payer: 59 | Admitting: Psychiatry

## 2016-01-10 DIAGNOSIS — F411 Generalized anxiety disorder: Secondary | ICD-10-CM | POA: Diagnosis not present

## 2016-01-10 MED ORDER — LAMOTRIGINE 25 MG PO TABS: ORAL_TABLET | ORAL | Status: DC

## 2016-01-10 MED ORDER — BUPROPION HCL ER (XL) 150 MG PO TB24: 150.0000 mg | ORAL_TABLET | Freq: Every day | ORAL | Status: DC

## 2016-01-10 NOTE — Progress Notes (Signed)
Patient ID: Tracey Perry, female   DOB: Jun 05, 1991, 25 y.o.   MRN: [id number] [facility] MD/PA/NP OP Progress Note  01/10/2016 10:39 AM CAMEISHA WIECEK  MRN:  [id number]  Subjective: Patient is a 25 yo WF with long h/o MDD and generalized anxiety disorder. Patient has a diagnosis of borderline personality disorder by Dr. Jimmye Norman who she was seeing previously. This is the first appointment for this clinician with this patient. Today she reports that she is continuing to look for a job and is finding it financially stressful to make her mortgage payments and car payments. States that she has been compliant with her medications. Stated that she does not take the Klonopin as much. Stated that she started DBT therapy 3 weeks ago and has been attending regularly. States she does not like the therapy but understands it is needed for improving emotional regulation. Continues to report some trouble with sleep. Discussed the fact that patient is on 2 antidepressants, patient reported that this has seemed to have been working well for her but there was some talk about reducing her Wellbutrin. She continues to state his stated that she bursts into tears at the smallest stressor and is unable to stop that. States that if her cat overturned a bowl of water she gets very upset.  On a scale of 1-10, she reports her mood being at a 5. She reports some vague suicidal ideations but states that that's her normal baseline. Denies active suicidal thoughts. Denies use of substances or alcohol.   Chief Complaint: doing ok, financial stressors Chief Complaint    Follow-up; Medication Refill     Visit Diagnosis:   No diagnosis found.  Past Medical History:  Past Medical History  Diagnosis Date  . Herpes simplex without complication   . Vitamin D deficiency   . Morbid obesity due to excess calories ([place])   . Depression   . Allergy   . PMDD (premenstrual dysphoric disorder)   . Vertigo   . Gastroparesis   . Anxiety      Past Surgical History  Procedure Laterality Date  . Cholecystectomy    . Lumbar disc surgery    . Tonsillectomy    . Adenoidectomy    . Tympanostomy tube placement    . Appendectomy     Family History:  Family History  Problem Relation Age of Onset  . Depression Mother   . Hypertension Mother     controlled  . Allergic rhinitis Mother   . Anxiety disorder Mother   . Diabetes Father     controlled  . Hypertension Father   . Allergic rhinitis Father   . Depression Father   . Depression Brother   . Anxiety disorder Brother    Social History:  Social History   Social History  . Marital Status: Single    Spouse Name: N/A  . Number of Children: N/A  . Years of Education: N/A   Social History Main Topics  . Smoking status: Never Smoker   . Smokeless tobacco: Never Used  . Alcohol Use: 0.0 oz/week    0 Standard drinks or equivalent per week     Comment: Occasionally  . Drug Use: No  . Sexual Activity: No   Other Topics Concern  . None   Social History Narrative   Additional History:   Assessment:   Musculoskeletal: Strength & Muscle Tone: within normal limits Gait & Station: normal Patient leans: N/A  Psychiatric Specialty Exam: Anxiety Patient reports no insomnia, nervous/anxious  behavior or suicidal ideas.    Depression        Associated symptoms include does not have insomnia and no suicidal ideas.  Past medical history includes anxiety.     Review of Systems  Psychiatric/Behavioral: Positive for depression (It is now episodic and she tends to experience depression and crying fits when she is exposed to stress or problems.). Negative for suicidal ideas, hallucinations, memory loss and substance abuse. The patient is not nervous/anxious and does not have insomnia.   All other systems reviewed and are negative.   Blood pressure 118/78, pulse 120, temperature 98.9 F (37.2 C), temperature source Tympanic, height 5\' 5"  (1.651 m), weight 273 lb  (123.832 kg), SpO2 98 %.Body mass index is 45.43 kg/(m^2).  General Appearance: Well Groomed  Eye Contact:  Good  Speech:  Normal Rate  Volume:  Normal  Mood:  Good  Affect:  Congruent  Thought Process:  Linear and Logical  Orientation:  Full (Time, Place, and Person)  Thought Content:  Negative  Suicidal Thoughts:  No  Homicidal Thoughts:  No  Memory:  Immediate;   Good Recent;   Good Remote;   Good  Judgement:  Good  Insight:  Good  Psychomotor Activity:  Negative  Concentration:  Good  Recall:  Good  Fund of Knowledge: Good  Language: Good  Akathisia:  Negative  Handed:  Right unknown  AIMS (if indicated):  Not done today  Assets:  Communication Skills Desire for Improvement Vocational/Educational  ADL's:  Intact  Cognition: WNL  Sleep:  good   Is the patient at risk to self?  No. SIB related to emotions, No SI Has the patient been a risk to self in the past 6 months?  Yes.   Has the patient been a risk to self within the distant past?  Yes.   Is the patient a risk to others?  No. Has the patient been a risk to others in the past 6 months?  No. Has the patient been a risk to others within the distant past?  No.  Current Medications: Current Outpatient Prescriptions  Medication Sig Dispense Refill  . buPROPion (WELLBUTRIN XL) 300 MG 24 hr tablet Take 1 tablet (300 mg total) by mouth daily. 30 tablet 2  . cetirizine (ZYRTEC) 10 MG tablet Take by mouth.    . clonazePAM (KLONOPIN) 0.5 MG tablet Take 1 tablet (0.5 mg total) by mouth 2 (two) times daily. 60 tablet 4  . hydrOXYzine (ATARAX/VISTARIL) 25 MG tablet TAKE 1 TABLET BY MOUTH TWICE A DAY AS NEEDED FOR ANXIETY  0  . lamoTRIgine (LAMICTAL) 200 MG tablet Take 1 tablet (200 mg total) by mouth daily. 30 tablet 4  . levonorgestrel (MIRENA, 52 MG,) 20 MCG/24HR IUD by Intrauterine route.    Marland Kitchen VIIBRYD 40 MG TABS Take 1 tablet (40 mg total) by mouth daily. 30 tablet 2   No current facility-administered medications for  this visit.    Medical Decision Making:  Established Problem, Worsening (2), Review of Medication Regimen & Side Effects (2) and Review of New Medication or Change in Dosage (2)  Treatment Plan Summary:Medication management and Plan   Major depressive disorder, recurrent-  Increase Lamictal to 250 mg daily. Patient to titrate that to 225 mg for one week and then increased it to 250 mg daily. Decrease Wellbutrin XL to 150 mg daily, with patient that she is on 2 different antidepressants which could be activating and could also be leading to her sleep problems. Patient  agreeable to giving this a trial. Continue Viibryd at 40 mg daily for now.  Generalized Anxiety disorder Same as above for depression Continue DBT weekly. Encouraged patient to decrease his use of Klonopin to once daily for a week or 2 and taper off. Patient agreeable to this plan.  She will follow up in 2 weeks. She's been encouraged calling questions concerns prior to her next appointment.     Margherita Collyer 01/10/2016, 10:39 AM

## 2016-01-12 ENCOUNTER — Telehealth: Payer: Self-pay

## 2016-01-12 ENCOUNTER — Other Ambulatory Visit: Payer: Self-pay | Admitting: Family Medicine

## 2016-01-12 DIAGNOSIS — R0683 Snoring: Secondary | ICD-10-CM

## 2016-01-12 DIAGNOSIS — G4710 Hypersomnia, unspecified: Secondary | ICD-10-CM

## 2016-01-12 NOTE — Telephone Encounter (Signed)
ERRONEOUS ENTRY.

## 2016-01-22 ENCOUNTER — Telehealth: Payer: Self-pay

## 2016-01-22 NOTE — Telephone Encounter (Signed)
Patient called to check the status of her sleep study at [facility].   Patient's information was faxed to their office at [phone] with at copy of her demographics, insurance card and last note.

## 2016-01-25 ENCOUNTER — Ambulatory Visit (INDEPENDENT_AMBULATORY_CARE_PROVIDER_SITE_OTHER): Payer: 59 | Admitting: Psychiatry

## 2016-01-25 ENCOUNTER — Encounter: Payer: Self-pay | Admitting: Psychiatry

## 2016-01-25 DIAGNOSIS — F411 Generalized anxiety disorder: Secondary | ICD-10-CM

## 2016-01-25 DIAGNOSIS — F331 Major depressive disorder, recurrent, moderate: Secondary | ICD-10-CM | POA: Diagnosis not present

## 2016-01-25 MED ORDER — BUPROPION HCL ER (XL) 150 MG PO TB24: 150.0000 mg | ORAL_TABLET | Freq: Every day | ORAL | Status: DC

## 2016-01-25 MED ORDER — VIIBRYD 40 MG PO TABS: 40.0000 mg | ORAL_TABLET | Freq: Every day | ORAL | Status: DC

## 2016-01-25 NOTE — Progress Notes (Signed)
Patient ID: Tracey Perry, female   DOB: 12-04-1990, 25 y.o.   MRN: [id number] [facility] MD/PA/NP OP Progress Note  01/25/2016 10:09 AM Tracey Perry  MRN:  [id number]  Subjective: Patient is a 25 yo WF with long h/o MDD and generalized anxiety disorder. She was seen today for a follow up. She reports that she has been able to tolerate her medication changes. States she continues to feel very emotional. Her grandfather was in a car accident and she states he is serious and probably dying. States this has been very stressful for her. States her sleep is the same. She is stressed with DBT therapy too, since she is at an emotional point in therapy. On a scale of 1-10, she reports her mood being at a 5. She reports some vague suicidal ideations but states that that's her normal baseline. Denies active suicidal thoughts. Denies use of substances or alcohol.  Patient not interested in making any medication changes at this time. She is however requesting for this clinician to do her disability paperwork which needs to be done on a monthly basis.   Chief Complaint: stressed due to grand father`s condition.  Visit Diagnosis:   No diagnosis found.  Past Medical History:  Past Medical History  Diagnosis Date  . Herpes simplex without complication   . Vitamin D deficiency   . Morbid obesity due to excess calories ([place])   . Depression   . Allergy   . PMDD (premenstrual dysphoric disorder)   . Vertigo   . Gastroparesis   . Anxiety     Past Surgical History  Procedure Laterality Date  . Cholecystectomy    . Lumbar disc surgery    . Tonsillectomy    . Adenoidectomy    . Tympanostomy tube placement    . Appendectomy     Family History:  Family History  Problem Relation Age of Onset  . Depression Mother   . Hypertension Mother     controlled  . Allergic rhinitis Mother   . Anxiety disorder Mother   . Diabetes Father     controlled  . Hypertension Father   . Allergic rhinitis Father    . Depression Father   . Depression Brother   . Anxiety disorder Brother    Social History:  Social History   Social History  . Marital Status: Single    Spouse Name: N/A  . Number of Children: N/A  . Years of Education: N/A   Social History Main Topics  . Smoking status: Never Smoker   . Smokeless tobacco: Never Used  . Alcohol Use: 0.0 oz/week    0 Standard drinks or equivalent per week     Comment: Occasionally  . Drug Use: No  . Sexual Activity: No   Other Topics Concern  . Not on file   Social History Narrative   Additional History:   Assessment:   Musculoskeletal: Strength & Muscle Tone: within normal limits Gait & Station: normal Patient leans: N/A  Psychiatric Specialty Exam: Anxiety Patient reports no insomnia, nervous/anxious behavior or suicidal ideas.    Depression        Associated symptoms include does not have insomnia and no suicidal ideas.  Past medical history includes anxiety.     Review of Systems  Psychiatric/Behavioral: Positive for depression (It is now episodic and she tends to experience depression and crying fits when she is exposed to stress or problems.). Negative for suicidal ideas, hallucinations, memory loss and substance abuse. The patient  is not nervous/anxious and does not have insomnia.   All other systems reviewed and are negative.   There were no vitals taken for this visit.There is no weight on file to calculate BMI.  General Appearance: Well Groomed  Eye Contact:  Good  Speech:  Normal Rate  Volume:  Normal  Mood:  Good  Affect:  Congruent  Thought Process:  Linear and Logical  Orientation:  Full (Time, Place, and Person)  Thought Content:  Negative  Suicidal Thoughts:  No  Homicidal Thoughts:  No  Memory:  Immediate;   Good Recent;   Good Remote;   Good  Judgement:  Good  Insight:  Good  Psychomotor Activity:  Negative  Concentration:  Good  Recall:  Good  Fund of Knowledge: Good  Language: Good  Akathisia:   Negative  Handed:  Right unknown  AIMS (if indicated):  Not done today  Assets:  Communication Skills Desire for Improvement Vocational/Educational  ADL's:  Intact  Cognition: WNL  Sleep:  good   Is the patient at risk to self?  No. SIB related to emotions, No SI Has the patient been a risk to self in the past 6 months?  Yes.   Has the patient been a risk to self within the distant past?  Yes.   Is the patient a risk to others?  No. Has the patient been a risk to others in the past 6 months?  No. Has the patient been a risk to others within the distant past?  No.  Current Medications: Current Outpatient Prescriptions  Medication Sig Dispense Refill  . buPROPion (WELLBUTRIN XL) 150 MG 24 hr tablet Take 1 tablet (150 mg total) by mouth daily. 30 tablet 0  . cetirizine (ZYRTEC) 10 MG tablet Take by mouth.    . clonazePAM (KLONOPIN) 0.5 MG tablet Take 1 tablet (0.5 mg total) by mouth 2 (two) times daily. 60 tablet 4  . hydrOXYzine (ATARAX/VISTARIL) 25 MG tablet TAKE 1 TABLET BY MOUTH TWICE A DAY AS NEEDED FOR ANXIETY  0  . lamoTRIgine (LAMICTAL) 200 MG tablet Take 1 tablet (200 mg total) by mouth daily. 30 tablet 4  . lamoTRIgine (LAMICTAL) 25 MG tablet Take 1 tablet daily for 1 week, then take 2 tablets daily by mouth in the morning. 30 tablet 2  . levonorgestrel (MIRENA, 52 MG,) 20 MCG/24HR IUD by Intrauterine route.    Marland Kitchen VIIBRYD 40 MG TABS Take 1 tablet (40 mg total) by mouth daily. 30 tablet 2   No current facility-administered medications for this visit.    Medical Decision Making:  Established Problem, Worsening (2), Review of Medication Regimen & Side Effects (2) and Review of New Medication or Change in Dosage (2)  Treatment Plan Summary:Medication management and Plan   Major depressive disorder, recurrent-  Continue Lamictal to 250 mg daily.  Continue Wellbutrin XL to 150 mg daily. Continue Viibryd at 40 mg daily for now.  Generalized Anxiety disorder Same as above  for depression Continue DBT weekly. Encouraged patient to decrease her use of Klonopin to once daily, patient states she is doing that most days currently.  She will follow up in 4 weeks. She's been encouraged calling questions concerns prior to her next appointment.     Marnisha Stampley 01/25/2016, 10:09 AM

## 2016-01-30 ENCOUNTER — Telehealth: Payer: Self-pay | Admitting: Family Medicine

## 2016-01-30 NOTE — Telephone Encounter (Signed)
Requesting return call: stated that you guys have been going back and forth trying to get a referral to [facility]

## 2016-01-30 NOTE — Telephone Encounter (Signed)
Patient was given the number to the [facility] [phone] and was encouraged to give them a call to see if they received our referrral.

## 2016-02-21 ENCOUNTER — Ambulatory Visit (INDEPENDENT_AMBULATORY_CARE_PROVIDER_SITE_OTHER): Payer: 59 | Admitting: Psychiatry

## 2016-02-21 ENCOUNTER — Encounter: Payer: Self-pay | Admitting: Psychiatry

## 2016-02-21 VITALS — BP 110/72 | HR 102 | Temp 97.9°F | Ht 65.0 in | Wt 276.0 lb

## 2016-02-21 DIAGNOSIS — F3130 Bipolar disorder, current episode depressed, mild or moderate severity, unspecified: Secondary | ICD-10-CM

## 2016-02-21 DIAGNOSIS — F603 Borderline personality disorder: Secondary | ICD-10-CM | POA: Diagnosis not present

## 2016-02-21 MED ORDER — LAMOTRIGINE 150 MG PO TABS: 150.0000 mg | ORAL_TABLET | Freq: Two times a day (BID) | ORAL | Status: DC

## 2016-02-21 NOTE — Progress Notes (Signed)
Patient ID: Tracey Perry, female   DOB: August 05, 1991, 25 y.o.   MRN: [id number]  [facility] MD/PA/NP OP Progress Note  02/21/2016 10:04 AM Tracey Perry  MRN:  [id number]  Subjective: Patient is a 25 yo WF with long h/o MDD and generalized anxiety disorder. She was seen today for a follow up. She continues to report that she has not been doing well. States that her grandfather was brought home to die and then he began to get better. However she does not exhibit any joy at this. At her last visit she had stated that her grandfather not doing well was her biggest stressor. Today she reports that she continues to be very emotional and has been crying a lot. She is unable to pinpoint any stressors. She states that she is trying to apply for jobs but does not get any. She is not interested in her medication changes but then states that she would take this physician's recommendations. We discussed increasing her Lamictal to 300 mg daily from 250 mg. At the end of the visit she states that she will not be coming back to this clinic because she wants to transfer to [facility] which is more convenient for her.  Patient denies being suicidal. States that she continues to be excessively emotional and this is baseline for her. States she is attending DBT and have not yet reached emotional regulation part.  Chief Complaint: Being excessively emotional which is baseline for her per patient  Visit Diagnosis:   No diagnosis found.  Past Medical History:  Past Medical History  Diagnosis Date  . Herpes simplex without complication   . Vitamin D deficiency   . Morbid obesity due to excess calories ([place])   . Depression   . Allergy   . PMDD (premenstrual dysphoric disorder)   . Vertigo   . Gastroparesis   . Anxiety     Past Surgical History  Procedure Laterality Date  . Cholecystectomy    . Lumbar disc surgery    . Tonsillectomy    . Adenoidectomy    . Tympanostomy tube placement    . Appendectomy      Family History:  Family History  Problem Relation Age of Onset  . Depression Mother   . Hypertension Mother     controlled  . Allergic rhinitis Mother   . Anxiety disorder Mother   . Diabetes Father     controlled  . Hypertension Father   . Allergic rhinitis Father   . Depression Father   . Depression Brother   . Anxiety disorder Brother    Social History:  Social History   Social History  . Marital Status: Single    Spouse Name: N/A  . Number of Children: N/A  . Years of Education: N/A   Social History Main Topics  . Smoking status: Never Smoker   . Smokeless tobacco: Never Used  . Alcohol Use: 0.0 oz/week    0 Standard drinks or equivalent per week     Comment: Occasionally  . Drug Use: No  . Sexual Activity: No   Other Topics Concern  . Not on file   Social History Narrative   Additional History:   Assessment:   Musculoskeletal: Strength & Muscle Tone: within normal limits Gait & Station: normal Patient leans: N/A  Psychiatric Specialty Exam: Anxiety Patient reports no insomnia, nervous/anxious behavior or suicidal ideas.    Depression        Associated symptoms include does not have insomnia and  no suicidal ideas.  Past medical history includes anxiety.     Review of Systems  Psychiatric/Behavioral: Positive for depression (It is now episodic and she tends to experience depression and crying fits when she is exposed to stress or problems.). Negative for suicidal ideas, hallucinations, memory loss and substance abuse. The patient is not nervous/anxious and does not have insomnia.   All other systems reviewed and are negative.   There were no vitals taken for this visit.There is no weight on file to calculate BMI.  General Appearance: Well Groomed  Eye Contact:  Good  Speech:  Normal Rate  Volume:  Normal  Mood:  Very emotional   Affect:  Tearful   Thought Process:  Linear and Logical  Orientation:  Full (Time, Place, and Person)  Thought  Content:  Negative  Suicidal Thoughts:  No  Homicidal Thoughts:  No  Memory:  Immediate;   Good Recent;   Good Remote;   Good  Judgement:  Good  Insight:  Good  Psychomotor Activity:  Negative  Concentration:  Good  Recall:  Good  Fund of Knowledge: Good  Language: Good  Akathisia:  Negative  Handed:  Right unknown  AIMS (if indicated):  Not done today  Assets:  Communication Skills Desire for Improvement Vocational/Educational  ADL's:  Intact  Cognition: WNL  Sleep:  good   Is the patient at risk to self?  No. SIB related to emotions, No SI Has the patient been a risk to self in the past 6 months?  Yes.   Has the patient been a risk to self within the distant past?  Yes.   Is the patient a risk to others?  No. Has the patient been a risk to others in the past 6 months?  No. Has the patient been a risk to others within the distant past?  No.  Current Medications: Current Outpatient Prescriptions  Medication Sig Dispense Refill  . buPROPion (WELLBUTRIN XL) 150 MG 24 hr tablet Take 1 tablet (150 mg total) by mouth daily. 30 tablet 0  . cetirizine (ZYRTEC) 10 MG tablet Take by mouth.    . clonazePAM (KLONOPIN) 0.5 MG tablet Take 1 tablet (0.5 mg total) by mouth 2 (two) times daily. 60 tablet 4  . hydrOXYzine (ATARAX/VISTARIL) 25 MG tablet TAKE 1 TABLET BY MOUTH TWICE A DAY AS NEEDED FOR ANXIETY  0  . lamoTRIgine (LAMICTAL) 200 MG tablet Take 1 tablet (200 mg total) by mouth daily. 30 tablet 4  . lamoTRIgine (LAMICTAL) 25 MG tablet Take 1 tablet daily for 1 week, then take 2 tablets daily by mouth in the morning. 30 tablet 2  . levonorgestrel (MIRENA, 52 MG,) 20 MCG/24HR IUD by Intrauterine route.    Marland Kitchen VIIBRYD 40 MG TABS Take 1 tablet (40 mg total) by mouth daily. 30 tablet 2   No current facility-administered medications for this visit.    Medical Decision Making:  Established Problem, Worsening (2), Review of Medication Regimen & Side Effects (2) and Review of New  Medication or Change in Dosage (2)  Treatment Plan Summary:Medication management and Plan   Major depressive disorder, recurrent-  Increase Lamictal to 150 mg twice daily Continue Wellbutrin XL at 150 mg daily. Continue Viibryd at 40 mg daily for now.  Generalized Anxiety disorder Same as above for depression Continue DBT weekly. Encouraged patient to decrease her use of Klonopin to once daily, patient states she is doing that most days currently.  Patient will not follow up at this  clinic anymore. She would like to see someone in [place] since it's more convenient for her.     Eve Rey 02/21/2016, 10:04 AM

## 2016-02-22 ENCOUNTER — Telehealth (HOSPITAL_COMMUNITY): Payer: Self-pay

## 2016-04-03 ENCOUNTER — Other Ambulatory Visit: Payer: Self-pay | Admitting: Psychiatry

## 2016-05-11 ENCOUNTER — Encounter: Payer: Self-pay | Admitting: Family Medicine

## 2016-05-11 DIAGNOSIS — G4733 Obstructive sleep apnea (adult) (pediatric): Secondary | ICD-10-CM | POA: Insufficient documentation

## 2016-05-15 LAB — HM PAP SMEAR

## 2016-06-20 DIAGNOSIS — N921 Excessive and frequent menstruation with irregular cycle: Secondary | ICD-10-CM | POA: Insufficient documentation

## 2016-07-29 ENCOUNTER — Encounter: Payer: Self-pay | Admitting: Family Medicine

## 2016-07-29 ENCOUNTER — Ambulatory Visit (INDEPENDENT_AMBULATORY_CARE_PROVIDER_SITE_OTHER): Payer: Commercial Managed Care - PPO | Admitting: Family Medicine

## 2016-07-29 VITALS — BP 118/68 | HR 100 | Temp 99.0°F | Resp 18 | Wt 276.3 lb

## 2016-07-29 DIAGNOSIS — G4733 Obstructive sleep apnea (adult) (pediatric): Secondary | ICD-10-CM

## 2016-07-29 DIAGNOSIS — B001 Herpesviral vesicular dermatitis: Secondary | ICD-10-CM | POA: Diagnosis not present

## 2016-07-29 DIAGNOSIS — F39 Unspecified mood [affective] disorder: Secondary | ICD-10-CM

## 2016-07-29 DIAGNOSIS — Z23 Encounter for immunization: Secondary | ICD-10-CM | POA: Diagnosis not present

## 2016-07-29 MED ORDER — VALACYCLOVIR HCL 1 G PO TABS: 1000.0000 mg | ORAL_TABLET | Freq: Two times a day (BID) | ORAL | 0 refills | Status: DC

## 2016-07-29 NOTE — Progress Notes (Signed)
Name: Tracey Perry   MRN: [id number]    DOB: 09/13/1991   Date:07/29/2016       Progress Note  Subjective  Chief Complaint  Chief Complaint  Patient presents with  . Mouth Lesions    for about a month    HPI  Recurrent fever blister: she was doing well, however over the past month she had two episodes of fever blister, tender, blister, currently starting a new one on bottom right lip, out of rx of Valtrex.   Mood disorder: seeing a NP at [place] - and states therapy and medication has helped. States N-acetl cysteine has improved her impulses and no self harm in the past 8 months. She used to cut herself. She states therapist thinking about binge eating disorder, explained that vyvanse can be used to that and she will discuss with psychiatrist.   OSA: seen by Neurologist , she is supposed to be on CPAP but can't tolerate it, she will see someone at [place] school to see if a mouth apparatus will improve her symptoms. She does not snore, but she has multiple pauses during sleep.   Obesity: she skips meals and overeats at night, she is struggling with her neighbors, they are loud and they wake her up at night.    Patient Active Problem List   Diagnosis Date Noted  . Fever blister 07/29/2016  . OSA (obstructive sleep apnea) 05/11/2016  . Hypersomnia 12/25/2015  . Snoring 12/25/2015  . Cluster B personality disorder 10/27/2015  . Episodic mood disorder ([place]) 10/27/2015  . H/O urinary tract infection 06/21/2015  . 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 Affective disorder ([place]) 06/21/2015  . History of migraine headaches 06/21/2015  . Depression, major, recurrent, moderate ([place]) 06/21/2015  . Gastroparesis 06/21/2015  . Hx of cold sores 06/21/2015  . Bipolar affective disorder ([place]) 05/31/2015  . Generalized anxiety disorder 05/31/2015  . History of pyelonephritis 05/30/2015  . Dysmenorrhea 05/30/2015  . History of asthma 05/30/2015  . Migraine without aura and  responsive to treatment 05/30/2015  . Extreme obesity ([place]) 05/30/2015  . Allergic rhinitis, seasonal 05/30/2015  . Vertigo 05/30/2015  . IBS (irritable bowel syndrome) 09/20/2013  . H/O cervical spine surgery 12/29/2012  . Vitamin D deficiency 11/23/2009  . History of mononucleosis 04/03/2009    Past Surgical History:  Procedure Laterality Date  . ADENOIDECTOMY    . APPENDECTOMY    . CHOLECYSTECTOMY    . LUMBAR DISC SURGERY    . TONSILLECTOMY    . TYMPANOSTOMY TUBE PLACEMENT      Family History  Problem Relation Age of Onset  . Depression Mother   . Hypertension Mother     controlled  . Allergic rhinitis Mother   . Anxiety disorder Mother   . Diabetes Father     controlled  . Hypertension Father   . Allergic rhinitis Father   . Depression Father   . Depression Brother   . Anxiety disorder Brother     Social History   Social History  . Marital status: Single    Spouse name: N/A  . Number of children: N/A  . Years of education: N/A   Occupational History  . Not on file.   Social History Main Topics  . Smoking status: Never Smoker  . Smokeless tobacco: Never Used  . Alcohol use 0.0 oz/week     Comment: Occasionally  . Drug use: No  . Sexual activity: No   Other Topics Concern  .  Not on file   Social History Narrative  . No narrative on file     Current Outpatient Prescriptions:  .  cetirizine (ZYRTEC) 10 MG tablet, Take by mouth., Disp: , Rfl:  .  clonazePAM (KLONOPIN) 0.5 MG tablet, Take 1 tablet (0.5 mg total) by mouth 2 (two) times daily., Disp: 60 tablet, Rfl: 4 .  fluticasone (FLONASE) 50 MCG/ACT nasal spray, Place into both nostrils daily., Disp: , Rfl:  .  hydrOXYzine (ATARAX/VISTARIL) 25 MG tablet, TAKE 1 TABLET BY MOUTH TWICE A DAY AS NEEDED FOR ANXIETY, Disp: , Rfl: 0 .  lamoTRIgine (LAMICTAL) 150 MG tablet, Take 1 tablet (150 mg total) by mouth 2 (two) times daily., Disp: 60 tablet, Rfl: 1 .  norethindrone (AYGESTIN) 5 MG tablet, Take 5 mg  by mouth daily., Disp: , Rfl:  .  VIIBRYD 40 MG TABS, Take 1 tablet (40 mg total) by mouth daily., Disp: 30 tablet, Rfl: 2 .  valACYclovir (VALTREX) 1000 MG tablet, Take 1 tablet (1,000 mg total) by mouth 2 (two) times daily., Disp: 20 tablet, Rfl: 0  Allergies  Allergen Reactions  . Nutritional Supplements Itching and Swelling    ONLY certain "FRESH FRUITS" cause throat itches & in spring, throat feels like it will swell.  . Prednisone Other (See Comments) and Palpitations    Severe mood swings and tachycardia     ROS  Ten systems reviewed and is negative except as mentioned in HPI  Objective  Vitals:   07/29/16 1355  BP: 118/68  Pulse: 100  Resp: 18  Temp: 99 F (37.2 C)  SpO2: 98%  Weight: 276 lb 5 oz (125.3 kg)    Body mass index is 45.98 kg/m.  Physical Exam  Constitutional: Patient appears well-developed and well-nourished. Obese  No distress.  HEENT: head atraumatic, normocephalic, pupils equal and reactive to light, neck supple, throat within normal limits Cardiovascular: Normal rate, regular rhythm and normal heart sounds.  No murmur heard. No BLE edema. Pulmonary/Chest: Effort normal and breath sounds normal. No respiratory distress. Abdominal: Soft.  There is no tenderness. Psychiatric: Patient has a normal mood and affect. behavior is normal. Judgment and thought content normal. Skin: fever blister right lower lip  PHQ2/9: Depression screen [facility] 2/9 12/25/2015 05/30/2015  Decreased Interest 0 0  Down, Depressed, Hopeless 0 1  PHQ - 2 Score 0 1  Some encounter information is confidential and restricted. Go to Review Flowsheets activity to see all data.     Fall Risk: Fall Risk  12/25/2015 05/30/2015  Falls in the past year? No No    Assessment & Plan  1. Fever blister  - valACYclovir (VALTREX) 1000 MG tablet; Take 1 tablet (1,000 mg total) by mouth 2 (two) times daily.  Dispense: 20 tablet; Refill: 0  2. Morbid obesity, unspecified obesity type  [facility])  Discussed with the patient the risk posed by an increased BMI. Discussed importance of portion control, calorie counting and at least 150 minutes of physical activity weekly. Avoid sweet beverages and drink more water. Eat at least 6 servings of fruit and vegetables daily   3. OSA (obstructive sleep apnea)  Continue follow up with neurologist  4. Needs flu shot  - Flu Vaccine QUAD 36+ mos IM  5. Affective disorder ([place])  Continue follow up with psychiatrist

## 2016-09-10 ENCOUNTER — Ambulatory Visit: Payer: Self-pay | Admitting: Family Medicine

## 2016-09-18 ENCOUNTER — Ambulatory Visit: Payer: Self-pay | Admitting: Family Medicine

## 2017-03-06 DIAGNOSIS — J019 Acute sinusitis, unspecified: Secondary | ICD-10-CM | POA: Diagnosis not present

## 2017-03-06 DIAGNOSIS — B9689 Other specified bacterial agents as the cause of diseases classified elsewhere: Secondary | ICD-10-CM | POA: Diagnosis not present

## 2017-04-01 DIAGNOSIS — K529 Noninfective gastroenteritis and colitis, unspecified: Secondary | ICD-10-CM | POA: Diagnosis not present

## 2017-06-25 DIAGNOSIS — B9689 Other specified bacterial agents as the cause of diseases classified elsewhere: Secondary | ICD-10-CM | POA: Diagnosis not present

## 2017-06-25 DIAGNOSIS — J329 Chronic sinusitis, unspecified: Secondary | ICD-10-CM | POA: Diagnosis not present

## 2017-06-25 DIAGNOSIS — R51 Headache: Secondary | ICD-10-CM | POA: Diagnosis not present

## 2017-06-30 ENCOUNTER — Ambulatory Visit (INDEPENDENT_AMBULATORY_CARE_PROVIDER_SITE_OTHER): Payer: Commercial Managed Care - PPO | Admitting: Family Medicine

## 2017-06-30 ENCOUNTER — Encounter: Payer: Self-pay | Admitting: Family Medicine

## 2017-06-30 VITALS — BP 120/66 | HR 96 | Temp 98.6°F | Resp 17 | Ht 65.0 in | Wt 263.2 lb

## 2017-06-30 DIAGNOSIS — G43009 Migraine without aura, not intractable, without status migrainosus: Secondary | ICD-10-CM

## 2017-06-30 DIAGNOSIS — G4733 Obstructive sleep apnea (adult) (pediatric): Secondary | ICD-10-CM | POA: Diagnosis not present

## 2017-06-30 DIAGNOSIS — F310 Bipolar disorder, current episode hypomanic: Secondary | ICD-10-CM

## 2017-06-30 MED ORDER — BACLOFEN 20 MG PO TABS: 20.0000 mg | ORAL_TABLET | Freq: Three times a day (TID) | ORAL | 0 refills | Status: DC

## 2017-06-30 MED ORDER — NORTRIPTYLINE HCL 10 MG PO CAPS: 10.0000 mg | ORAL_CAPSULE | Freq: Two times a day (BID) | ORAL | 0 refills | Status: DC

## 2017-06-30 NOTE — Progress Notes (Signed)
Name: Tracey Perry   MRN: [id number]    DOB: [birth year]   Date:06/30/2017       Progress Note  Subjective  Chief Complaint  Chief Complaint  Patient presents with  . Headache    x1 wk    HPI  Migraine headache: she has a remote history of migraine, but was symptoms free for a long time. This episode started one week ago, initially frontal pressure, and some allergy symptoms. She went to Urgent care and thought it was sinusitis. She was given doxy but symptoms are not improving. She states sharp and intense when first gets up in am, but improves and changes to a dull or pressure over temporal areas and jaw line, also behind her eyes. Associated with nausea, fatigue and also some phonophobia. She is also under a lot of stress. She was on her great-aunts will and her grandmother is upset about. Causing a lot of family conflict.    Obesity: she has changed her life style, eating about 1800 calories daily and has lost about 19 lbs in the past 2 months. She is happy with results. She has not started exercising yet.   OSA: seen by Neurologist last year, but not on CPAP because of cost, sleeps on her side and is doing better.   Bipolar: recently went in a episode of mania, sees NP at [facility] and is on Risperdal, and is doing better.    Patient Active Problem List   Diagnosis Date Noted  . Fever blister 07/29/2016  . OSA (obstructive sleep apnea) 05/11/2016  . Hypersomnia 12/25/2015  . Snoring 12/25/2015  . Cluster B personality disorder 10/27/2015  . Episodic mood disorder ([place]) 10/27/2015  . H/O urinary tract infection 06/21/2015  . 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 Affective disorder ([place]) 06/21/2015  . History of migraine headaches 06/21/2015  . Depression, major, recurrent, moderate ([place]) 06/21/2015  . Gastroparesis 06/21/2015  . Hx of cold sores 06/21/2015  . Bipolar affective disorder ([place]) 05/31/2015  . Generalized anxiety disorder 05/31/2015  . History of pyelonephritis  05/30/2015  . Dysmenorrhea 05/30/2015  . History of asthma 05/30/2015  . Migraine without aura and responsive to treatment 05/30/2015  . Extreme obesity 05/30/2015  . Allergic rhinitis, seasonal 05/30/2015  . Vertigo 05/30/2015  . IBS (irritable bowel syndrome) 09/20/2013  . H/O cervical spine surgery 12/29/2012  . Vitamin D deficiency 11/23/2009  . History of mononucleosis 04/03/2009    Past Surgical History:  Procedure Laterality Date  . ADENOIDECTOMY    . APPENDECTOMY    . CHOLECYSTECTOMY    . LUMBAR DISC SURGERY    . TONSILLECTOMY    . TYMPANOSTOMY TUBE PLACEMENT      Family History  Problem Relation Age of Onset  . Depression Mother   . Hypertension Mother        controlled  . Allergic rhinitis Mother   . Anxiety disorder Mother   . Diabetes Father        controlled  . Hypertension Father   . Allergic rhinitis Father   . Depression Father   . Depression Brother   . Anxiety disorder Brother     Social History   Social History  . Marital status: Single    Spouse name: N/A  . Number of children: N/A  . 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  Smoking status: Never Smoker  . Smokeless tobacco: Never Used  . Alcohol use 0.0 oz/week     Comment: Occasionally  . Drug use: No  . Sexual activity: No   Other Topics Concern  . 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  cetirizine (ZYRTEC) 10 MG tablet, Take by mouth., Disp: , Rfl:  .  clonazePAM (KLONOPIN) 0.5 MG tablet, Take 1 tablet (0.5 mg total) by mouth 2 (two) times daily., Disp: 60 tablet, Rfl: 4 .  doxycycline (VIBRAMYCIN) 100 MG capsule, Take 100 mg by mouth 2 (two) times daily., Disp: , Rfl:  .  fluticasone (FLONASE) 50 MCG/ACT nasal spray, Place into both nostrils  daily., Disp: , Rfl:  .  lamoTRIgine (LAMICTAL) 150 MG tablet, Take 1 tablet (150 mg total) by mouth 2 (two) times daily., Disp: 60 tablet, Rfl: 1 .  norethindrone (AYGESTIN) 5 MG tablet, Take 5 mg by mouth daily., Disp: , Rfl:  .  risperiDONE (RISPERDAL) 0.25 MG tablet, Take 0.75 mg by mouth at bedtime., Disp: , Rfl: 2 .  valACYclovir (VALTREX) 1000 MG tablet, Take 1 tablet (1,000 mg total) by mouth 2 (two) times daily., Disp: 20 tablet, Rfl: 0 .  VIIBRYD 40 MG TABS, Take 1 tablet (40 mg total) by mouth daily., Disp: 30 tablet, Rfl: 2 .  baclofen (LIORESAL) 20 MG tablet, Take 1 tablet (20 mg total) by mouth 3 (three) times daily., Disp: 30 each, Rfl: 0 .  nortriptyline (PAMELOR) 10 MG capsule, Take 1 capsule (10 mg total) by mouth 2 (two) times daily., Disp: 10 capsule, Rfl: 0  Allergies  Allergen Reactions  . Nutritional Supplements Itching and Swelling    ONLY certain "FRESH FRUITS" cause throat itches & in spring, throat feels like it will swell.  . Prednisone Other (See Comments) and Palpitations    Severe mood swings and tachycardia     ROS  Constitutional: Negative for fever, positive for  weight change.  Respiratory: Negative for cough and shortness of breath.   Cardiovascular: Negative for chest pain or palpitations.  Gastrointestinal: Negative for abdominal pain, no bowel changes.  Musculoskeletal: Negative for gait problem or joint swelling.  Skin: Negative for rash.  Neurological: Negative for dizziness, positive for  headache.  No other specific complaints in a complete review of systems (except as listed in HPI above).  Objective  Vitals:   06/30/17 1101  BP: 120/66  Pulse: 96  Resp: 17  Temp: 98.6 F (37 C)  TempSrc: Oral  SpO2: 97%  Weight: 263 lb 3.2 oz (119.4 kg)  Height: 5\' 5"  (1.651 m)    Body mass index is 43.8 kg/m.  Physical Exam  Constitutional: Patient appears well-developed and well-nourished. Obese  No distress.  HEENT: head atraumatic,  normocephalic, pupils equal and reactive to light,  neck supple, throat within normal limits Cardiovascular: Normal rate, regular rhythm and normal heart sounds.  No murmur heard. No BLE edema. Pulmonary/Chest: Effort normal and breath sounds normal. No respiratory distress. Abdominal: Soft.  There is no tenderness. Psychiatric: Patient has a normal mood and affect. behavior is normal. Judgment and thought content normal. Neurological: normal cranial nerves, no focal findings. Tender on nasal bridge unable to wear glasses.   PHQ2/9: Depression screen [facility] 2/9 06/30/2017 12/25/2015 05/30/2015  Decreased Interest 0 0 0  Down, Depressed, Hopeless  1 0 1  PHQ - 2 Score 1 0 1  Some encounter information is confidential and restricted. Go to Review Flowsheets activity to see all data.    Fall Risk: Fall Risk  06/30/2017 12/25/2015 05/30/2015  Falls in the past year? No No No    Functional Status Survey: Is the patient deaf or have difficulty hearing?: No Does the patient have difficulty seeing, even when wearing glasses/contacts?: Yes (contacts) Does the patient have difficulty concentrating, remembering, or making decisions?: No Does the patient have difficulty walking or climbing stairs?: No Does the patient have difficulty dressing or bathing?: No Does the patient have difficulty doing errands alone such as visiting a doctor's office or shopping?: No   Assessment & Plan  1. Migraine without aura and responsive to treatment  She is not able to take prednisone, or nausea medication because prednisone causes side effects and needs to go to work today, already missed two days. Advised that she will feel better if she takes time off.  - baclofen (LIORESAL) 20 MG tablet; Take 1 tablet (20 mg total) by mouth 3 (three) times daily.  Dispense: 30 each; Refill: 0 - nortriptyline (PAMELOR) 10 MG capsule; Take 1 capsule (10 mg total) by mouth 2 (two) times daily.  Dispense: 10 capsule; Refill: 0  2.  Morbid obesity, unspecified obesity type ([place])  Doing well on life style modification  3. OSA (obstructive sleep apnea)  Just sleeping on her side and is doing better  4. Bipolar affective disorder, current episode hypomanic ([place])  Continue follow up with psychiatrist.

## 2017-07-02 ENCOUNTER — Ambulatory Visit: Payer: Self-pay | Admitting: Family Medicine

## 2017-07-10 ENCOUNTER — Ambulatory Visit (INDEPENDENT_AMBULATORY_CARE_PROVIDER_SITE_OTHER): Payer: Commercial Managed Care - PPO | Admitting: Family Medicine

## 2017-07-10 ENCOUNTER — Encounter: Payer: Self-pay | Admitting: Family Medicine

## 2017-07-10 VITALS — BP 110/72 | HR 105 | Temp 98.4°F | Resp 16 | Ht 65.0 in | Wt 261.6 lb

## 2017-07-10 DIAGNOSIS — G43009 Migraine without aura, not intractable, without status migrainosus: Secondary | ICD-10-CM | POA: Diagnosis not present

## 2017-07-10 DIAGNOSIS — G4440 Drug-induced headache, not elsewhere classified, not intractable: Secondary | ICD-10-CM | POA: Diagnosis not present

## 2017-07-10 MED ORDER — TOPIRAMATE 25 MG PO TABS
25.0000 mg | ORAL_TABLET | Freq: Every day | ORAL | 0 refills | Status: DC
Start: 2017-07-10 — End: 2017-08-01

## 2017-07-10 MED ORDER — BACLOFEN 20 MG PO TABS: 20.0000 mg | ORAL_TABLET | Freq: Three times a day (TID) | ORAL | 0 refills | Status: DC

## 2017-07-10 NOTE — Progress Notes (Signed)
Name: Tracey Perry   MRN: [id number]    DOB: [birth year]   Date:07/10/2017       Progress Note  Subjective  Chief Complaint  Chief Complaint  Patient presents with  . Migraine    pt stated that she has had a consistant headache for weeks some relief when she takes tylenol      HPI  Migraine headache: she has a remote history of vestibular migraine ( treated by neurologist around 2014)  This episode started 3 week ago, initially frontal pressure, and some allergy symptoms. She went to Urgent care and thought it was sinusitis. She was given doxy but symptoms are not improving. She states sharp and intense when first gets up in am and left frontal, occasionally radiates to right side,  Initially had  nausea, fatigue and also some phonophobia Stress is down. Nortriptyline did not improve symptoms. She states Baclofen helps her sleep, but not the headache, but would like a refill. She states pain with Tylenol , but has to take every 4-6 hours, she has been doing that for the past 10 days. She also has episodes of dizziness that are brief but happens multiple times a day. She has taken Topamax in the past. We will refer her back to neurologist.    Patient Active Problem List   Diagnosis Date Noted  . Fever blister 07/29/2016  . OSA (obstructive sleep apnea) 05/11/2016  . Hypersomnia 12/25/2015  . Snoring 12/25/2015  . Cluster B personality disorder 10/27/2015  . Episodic mood disorder ([place]) 10/27/2015  . H/O urinary tract infection 06/21/2015  . 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 Affective disorder ([place]) 06/21/2015  . Depression, major, recurrent, moderate ([place]) 06/21/2015  . Gastroparesis 06/21/2015  . Hx of cold sores 06/21/2015  . Bipolar affective disorder ([place]) 05/31/2015  . Generalized anxiety disorder 05/31/2015  . History of pyelonephritis 05/30/2015  . Dysmenorrhea 05/30/2015  . History of asthma 05/30/2015  . Migraine without aura and responsive to treatment 05/30/2015   . Extreme obesity 05/30/2015  . Allergic rhinitis, seasonal 05/30/2015  . Vertigo 05/30/2015  . IBS (irritable bowel syndrome) 09/20/2013  . H/O cervical spine surgery 12/29/2012  . Vitamin D deficiency 11/23/2009  . History of mononucleosis 04/03/2009    Past Surgical History:  Procedure Laterality Date  . ADENOIDECTOMY    . APPENDECTOMY    . CHOLECYSTECTOMY    . LUMBAR DISC SURGERY    . TONSILLECTOMY    . TYMPANOSTOMY TUBE PLACEMENT      Family History  Problem Relation Age of Onset  . Depression Mother   . Hypertension Mother        controlled  . Allergic rhinitis Mother   . Anxiety disorder Mother   . Diabetes Father        controlled  . Hypertension Father   . Allergic rhinitis Father   . Depression Father   . Depression Brother   . Anxiety disorder Brother     Social History   Social History  . Marital status: Single    Spouse name: N/A  . Number of children: N/A  . 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 Smoking status: Never Smoker  . Smokeless tobacco: Never Used  . Alcohol use 0.0 oz/week     Comment: Occasionally  . Drug use: No  . Sexual activity: No  Other Topics Concern  . 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  baclofen (LIORESAL) 20 MG tablet, Take 1 tablet (20 mg total) by mouth 3 (three) times daily., Disp: 30 each, Rfl: 0 .  cetirizine (ZYRTEC) 10 MG tablet, Take by mouth., Disp: , Rfl:  .  clonazePAM (KLONOPIN) 0.5 MG tablet, Take 1 tablet (0.5 mg total) by mouth 2 (two) times daily., Disp: 60 tablet, Rfl: 4 .  doxycycline (VIBRAMYCIN) 100 MG capsule, Take 100 mg by mouth 2 (two) times daily., Disp: , Rfl:  .  fluticasone (FLONASE) 50 MCG/ACT nasal spray, Place into both nostrils daily., Disp: ,  Rfl:  .  lamoTRIgine (LAMICTAL) 150 MG tablet, Take 1 tablet (150 mg total) by mouth 2 (two) times daily., Disp: 60 tablet, Rfl: 1 .  norethindrone (AYGESTIN) 5 MG tablet, Take 5 mg by mouth daily., Disp: , Rfl:  .  risperiDONE (RISPERDAL) 0.25 MG tablet, Take 0.75 mg by mouth at bedtime., Disp: , Rfl: 2 .  valACYclovir (VALTREX) 1000 MG tablet, Take 1 tablet (1,000 mg total) by mouth 2 (two) times daily., Disp: 20 tablet, Rfl: 0 .  VIIBRYD 40 MG TABS, Take 1 tablet (40 mg total) by mouth daily., Disp: 30 tablet, Rfl: 2 .  topiramate (TOPAMAX) 25 MG tablet, Take 1-4 tablets (25-100 mg total) by mouth at bedtime., Disp: 90 tablet, Rfl: 0  Allergies  Allergen Reactions  . Nutritional Supplements Itching and Swelling    ONLY certain "FRESH FRUITS" cause throat itches & in spring, throat feels like it will swell.  . Prednisone Other (See Comments) and Palpitations    Severe mood swings and tachycardia  . Augmentin [Amoxicillin-Pot Clavulanate] Other (See Comments)    Mood swings     ROS  Ten systems reviewed and is negative except as mentioned in HPI    Objective  Vitals:   07/10/17 0833  BP: 110/72  Pulse: (!) 105  Resp: 16  Temp: 98.4 F (36.9 C)  SpO2: 97%  Weight: 261 lb 9 oz (118.6 kg)  Height: 5\' 5"  (1.651 m)    Body mass index is 43.53 kg/m.  Physical Exam  Constitutional: Patient appears well-developed and well-nourished. Obese No distress.  HEENT: head atraumatic, normocephalic, pupils equal and reactive to light, neck supple, throat within normal limits Cardiovascular: Normal rate, regular rhythm and normal heart sounds.  No murmur heard. No BLE edema. Pulmonary/Chest: Effort normal and breath sounds normal. No respiratory distress. Abdominal: Soft.  There is no tenderness. Psychiatric: Patient has a normal mood and affect. behavior is normal. Judgment and thought content normal. Neurological : no focal findings.   PHQ2/9: Depression screen [facility] 2/9 06/30/2017  12/25/2015 05/30/2015  Decreased Interest 0 0 0  Down, Depressed, Hopeless 1 0 1  PHQ - 2 Score 1 0 1  Some encounter information is confidential and restricted. Go to Review Flowsheets activity to see all data.     Fall Risk: Fall Risk  06/30/2017 12/25/2015 05/30/2015  Falls in the past year? No No No    Assessment & Plan  1. Migraine without aura and responsive to treatment  - topiramate (TOPAMAX) 25 MG tablet; Take 1-4 tablets (25-100 mg total) by mouth at bedtime.  Dispense: 90 tablet; Refill: 0 - Ambulatory referral to Neurology History of vestibular migraines  2. Rebound headache  -  topiramate (TOPAMAX) 25 MG tablet; Take 1-4 tablets (25-100 mg total) by mouth at bedtime.  Dispense: 90 tablet; Refill: 0 - Ambulatory referral to Neurology

## 2017-07-14 DIAGNOSIS — Z01419 Encounter for gynecological examination (general) (routine) without abnormal findings: Secondary | ICD-10-CM | POA: Diagnosis not present

## 2017-07-15 ENCOUNTER — Ambulatory Visit: Payer: Self-pay | Admitting: Family Medicine

## 2017-08-01 ENCOUNTER — Other Ambulatory Visit: Payer: Self-pay | Admitting: Family Medicine

## 2017-08-01 DIAGNOSIS — G4440 Drug-induced headache, not elsewhere classified, not intractable: Secondary | ICD-10-CM

## 2017-08-01 DIAGNOSIS — G43009 Migraine without aura, not intractable, without status migrainosus: Secondary | ICD-10-CM

## 2017-08-07 ENCOUNTER — Ambulatory Visit (INDEPENDENT_AMBULATORY_CARE_PROVIDER_SITE_OTHER): Payer: Commercial Managed Care - PPO | Admitting: Family Medicine

## 2017-08-07 ENCOUNTER — Encounter: Payer: Self-pay | Admitting: Family Medicine

## 2017-08-07 VITALS — BP 115/71 | HR 102 | Temp 98.1°F | Resp 16 | Ht 65.0 in | Wt 248.3 lb

## 2017-08-07 DIAGNOSIS — G43009 Migraine without aura, not intractable, without status migrainosus: Secondary | ICD-10-CM

## 2017-08-07 DIAGNOSIS — R634 Abnormal weight loss: Secondary | ICD-10-CM | POA: Diagnosis not present

## 2017-08-07 NOTE — Progress Notes (Signed)
Name: Tracey Perry   MRN: [id number]    DOB: [birth year]   Date:08/07/2017       Progress Note  Subjective  Chief Complaint  Chief Complaint  Patient presents with  . Follow-up    1 mo headches    HPI  Migraine headache: she has a remote history of vestibular migraine ( treated by neurologist around 2014)  She had a severed episode August 2018  initially frontal pressure, and some allergy symptoms. She went to Urgent care and thought it was sinusitis. She was given doxy but symptoms are not improving. She states sharp and intense when first gets up in am and left frontal, occasionally radiates to right side,  Initially had  nausea, fatigue and also some phonophobia Stress is down. Nortriptyline did not improve symptoms. She states Baclofen helps her sleep, but not the headache, but would like a refill. She is off Tylenol. She started on Topamax end of August , currently 100 mg at night ( lost 14 lbs since started medication - lack of appetite), she states she was sent home twice last week because of migraine. Episodes are different now, described as clusters of sharp pain on left temporal area, that lasts about 15-20 minutes and it happens about every 2 hours, denies lacrimation or rhinorrhea. She is still waiting for appointment with neurologist    Patient Active Problem List   Diagnosis Date Noted  . Fever blister 07/29/2016  . OSA (obstructive sleep apnea) 05/11/2016  . Hypersomnia 12/25/2015  . Snoring 12/25/2015  . Cluster B personality disorder 10/27/2015  . Episodic mood disorder ([place]) 10/27/2015  . H/O urinary tract infection 06/21/2015  . 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 Affective disorder ([place]) 06/21/2015  . Depression, major, recurrent, moderate ([place]) 06/21/2015  . Gastroparesis 06/21/2015  . Hx of cold sores 06/21/2015  . Bipolar affective disorder ([place]) 05/31/2015  . Generalized anxiety disorder 05/31/2015  . History of pyelonephritis 05/30/2015  .  Dysmenorrhea 05/30/2015  . History of asthma 05/30/2015  . Migraine without aura and responsive to treatment 05/30/2015  . Extreme obesity 05/30/2015  . Allergic rhinitis, seasonal 05/30/2015  . Vertigo 05/30/2015  . IBS (irritable bowel syndrome) 09/20/2013  . H/O cervical spine surgery 12/29/2012  . Vitamin D deficiency 11/23/2009  . History of mononucleosis 04/03/2009    Past Surgical History:  Procedure Laterality Date  . ADENOIDECTOMY    . APPENDECTOMY    . CHOLECYSTECTOMY    . LUMBAR DISC SURGERY    . TONSILLECTOMY    . TYMPANOSTOMY TUBE PLACEMENT      Family History  Problem Relation Age of Onset  . Depression Mother   . Hypertension Mother        controlled  . Allergic rhinitis Mother   . Anxiety disorder Mother   . Diabetes Father        controlled  . Hypertension Father   . Allergic rhinitis Father   . Depression Father   . Depression Brother   . Anxiety disorder Brother     Social History   Social History  . Marital status: Single    Spouse name: N/A  . Number of children: N/A  . 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 Smoking status: Never Smoker  . Smokeless tobacco: Never Used  . Alcohol use 0.0 oz/week  Comment: Occasionally  . Drug use: No  . Sexual activity: No   Other Topics Concern  . 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  cetirizine (ZYRTEC) 10 MG tablet, Take by mouth., Disp: , Rfl:  .  clonazePAM (KLONOPIN) 0.5 MG tablet, Take 1 tablet (0.5 mg total) by mouth 2 (two) times daily., Disp: 60 tablet, Rfl: 4 .  fluticasone (FLONASE) 50 MCG/ACT nasal spray, Place into both nostrils daily., Disp: , Rfl:  .  lamoTRIgine (LAMICTAL) 150 MG tablet, Take 1 tablet (150 mg total) by mouth 2 (two) times  daily., Disp: 60 tablet, Rfl: 1 .  norethindrone (AYGESTIN) 5 MG tablet, Take 5 mg by mouth daily., Disp: , Rfl:  .  risperiDONE (RISPERDAL) 0.25 MG tablet, Take 0.75 mg by mouth at bedtime., Disp: , Rfl: 2 .  topiramate (TOPAMAX) 100 MG tablet, Take 1 tablet (100 mg total) by mouth at bedtime., Disp: 90 tablet, Rfl: 0 .  valACYclovir (VALTREX) 1000 MG tablet, Take 1 tablet (1,000 mg total) by mouth 2 (two) times daily., Disp: 20 tablet, Rfl: 0 .  VIIBRYD 40 MG TABS, Take 1 tablet (40 mg total) by mouth daily., Disp: 30 tablet, Rfl: 2  Allergies  Allergen Reactions  . Nutritional Supplements Itching and Swelling    ONLY certain "FRESH FRUITS" cause throat itches & in spring, throat feels like it will swell.  . Prednisone Other (See Comments) and Palpitations    Severe mood swings and tachycardia  . Augmentin [Amoxicillin-Pot Clavulanate] Other (See Comments)    Mood swings     ROS  Ten systems reviewed and is negative except as mentioned in HPI   Objective  Vitals:   08/07/17 0801  BP: 115/71  Pulse: (!) 102  Resp: 16  Temp: 98.1 F (36.7 C)  TempSrc: Oral  SpO2: 97%  Weight: 248 lb 4.8 oz (112.6 kg)  Height: 5\' 5"  (1.651 m)    Body mass index is 41.32 kg/m.  Physical Exam  Constitutional: Patient appears well-developed and well-nourished. Obese  No distress.  HEENT: head atraumatic, normocephalic, pupils equal and reactive to light,  neck supple, throat within normal limits Cardiovascular: Normal rate, regular rhythm and normal heart sounds.  No murmur heard. No BLE edema. Pulmonary/Chest: Effort normal and breath sounds normal. No respiratory distress. Abdominal: Soft.  There is no tenderness. Psychiatric: Patient has a normal mood and affect. behavior is normal. Judgment and thought content normal. Neurologist: no neuro deficit.  PHQ2/9: Depression screen [facility] 2/9 08/07/2017 06/30/2017 12/25/2015 05/30/2015  Decreased Interest 0 0 0 0  Down, Depressed, Hopeless 0 1 0  1  PHQ - 2 Score 0 1 0 1  Some encounter information is confidential and restricted. Go to Review Flowsheets activity to see all data.     Fall Risk: Fall Risk  08/07/2017 06/30/2017 12/25/2015 05/30/2015  Falls in the past year? No No No No     Functional Status Survey: Is the patient deaf or have difficulty hearing?: No Does the patient have difficulty seeing, even when wearing glasses/contacts?: Yes (glasses and contacts) Does the patient have difficulty concentrating, remembering, or making decisions?: No Does the patient have difficulty walking or climbing stairs?: No Does the patient have difficulty dressing or bathing?: No Does the patient have difficulty doing errands alone such as visiting a doctor's  office or shopping?: No    Assessment & Plan  1. Migraine without aura and responsive to treatment  Intensity is the same but not constant and is able to function, described more as a cluster of 15-20 minutes. , she is waiting for referral to neurologist  - she does not want to change any medication at this time, she will hold off until seen by neurologist.  Vertigo has improved. Advised to increase dose of Topamax to 50 mg in am and 100 mg at night and see if she tolerates  2. Weight loss  Likely from Topamax, she states it happened last time she took Topamax, advised to eat small, but healthy and multiple times a day, to avoid triggered migraine

## 2017-09-05 ENCOUNTER — Ambulatory Visit: Payer: Self-pay | Admitting: Family Medicine

## 2017-09-11 DIAGNOSIS — G43009 Migraine without aura, not intractable, without status migrainosus: Secondary | ICD-10-CM | POA: Diagnosis not present

## 2017-09-11 DIAGNOSIS — G4440 Drug-induced headache, not elsewhere classified, not intractable: Secondary | ICD-10-CM | POA: Diagnosis not present

## 2017-10-31 ENCOUNTER — Telehealth: Payer: Self-pay

## 2017-10-31 ENCOUNTER — Other Ambulatory Visit: Payer: Self-pay | Admitting: Family Medicine

## 2017-10-31 DIAGNOSIS — G4440 Drug-induced headache, not elsewhere classified, not intractable: Secondary | ICD-10-CM

## 2017-10-31 DIAGNOSIS — G43009 Migraine without aura, not intractable, without status migrainosus: Secondary | ICD-10-CM

## 2017-10-31 NOTE — Telephone Encounter (Signed)
Copied from [place]. Topic: Quick Communication - Office Called Patient >> Oct 31, 2017 11:54 AM Myatt, Marland Kitchen wrote: Reason for CRM: Office called pt with questions about her Topromax. Dr Ancil Boozer needs to know if she is getting this from her Neuologist?  >> Oct 31, 2017 12:06 PM Marin Olp L wrote: Patient confirmed that she no longer take Topromax b/c they changed it to verapamil prescribed by neurologist.

## 2017-11-01 NOTE — Telephone Encounter (Signed)
Refill request for general medication: Topiramate 100 mg  Last office visit: 08/07/2017  Last physical exam: None indicated  Follow up visit: None indicated

## 2017-12-19 DIAGNOSIS — G43809 Other migraine, not intractable, without status migrainosus: Secondary | ICD-10-CM | POA: Diagnosis not present

## 2017-12-19 DIAGNOSIS — G43909 Migraine, unspecified, not intractable, without status migrainosus: Secondary | ICD-10-CM | POA: Diagnosis not present

## 2017-12-19 DIAGNOSIS — G4730 Sleep apnea, unspecified: Secondary | ICD-10-CM | POA: Diagnosis not present

## 2018-02-13 ENCOUNTER — Encounter: Payer: Self-pay | Admitting: Nurse Practitioner

## 2018-02-13 ENCOUNTER — Ambulatory Visit: Payer: Commercial Managed Care - PPO | Admitting: Nurse Practitioner

## 2018-02-13 VITALS — BP 124/68 | HR 93 | Temp 99.3°F | Resp 18 | Ht 65.0 in | Wt 229.3 lb

## 2018-02-13 DIAGNOSIS — J0100 Acute maxillary sinusitis, unspecified: Secondary | ICD-10-CM

## 2018-02-13 MED ORDER — DOXYCYCLINE HYCLATE 100 MG PO TABS
100.0000 mg | ORAL_TABLET | Freq: Two times a day (BID) | ORAL | 0 refills | Status: AC
Start: 2018-02-13 — End: 2018-02-20

## 2018-02-13 NOTE — Patient Instructions (Signed)

## 2018-02-13 NOTE — Progress Notes (Signed)
Name: GEORGE ALCANTAR   MRN: [id number]    DOB: 11/13/1991   Date:02/13/2018       Progress Note  Subjective  Chief Complaint  Chief Complaint  Patient presents with  . Sinusitis    sinus pressure, when blowing nose she see blood for 4 months    HPI  Patient presents with purulent-blood tinged nasal discharge, sometimes coagulated blood, mild bilateral sinus pressure worse on the left that is worse or localized to the sinuses when bending forward. Patient  Endorses mild fever and chills, states is always fatigued. Endorses ear pressure or fullness, halitosis. Endorses chronic headaches- follows up with neurology; started on topamax in august. Endorses intermittent lightheadedness; neurology associated with headaches. She states topamax hasn't really been helping. Headaches last about 20 minutes then self resolve 1-2x daily.    Denies cough, hyposmia, tinnitus, sore throat, shob, cp, no visual changes.   Symptoms have been ongoing for 3-4 months. Pt does not endorse biphasic patten- states has been progressive. Patient states she has allergies she wasn't sure if it was related- takes Claritin & Flonase- daily and zyrtec PRN.  Has had sinus infections in that typically self resolve or are treated with antibiotics but since she didn't have a lot of associated URI symptoms she waited longer to be seen.     Patient Active Problem List   Diagnosis Date Noted  . Fever blister 07/29/2016  . OSA (obstructive sleep apnea) 05/11/2016  . Hypersomnia 12/25/2015  . Snoring 12/25/2015  . Cluster B personality disorder ([place]) 10/27/2015  . Episodic mood disorder ([place]) 10/27/2015  . H/O urinary tract infection 06/21/2015  . 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 Affective disorder ([place]) 06/21/2015  . Depression, major, recurrent, moderate ([place]) 06/21/2015  . Gastroparesis 06/21/2015  . Hx of cold sores 06/21/2015  . Bipolar affective disorder ([place]) 05/31/2015  . Generalized anxiety disorder  05/31/2015  . History of pyelonephritis 05/30/2015  . Dysmenorrhea 05/30/2015  . History of asthma 05/30/2015  . Migraine without aura and responsive to treatment 05/30/2015  . Extreme obesity 05/30/2015  . Allergic rhinitis, seasonal 05/30/2015  . Vertigo 05/30/2015  . IBS (irritable bowel syndrome) 09/20/2013  . H/O cervical spine surgery 12/29/2012  . Vitamin D deficiency 11/23/2009  . History of mononucleosis 04/03/2009    Social History   Tobacco Use  . Smoking status: Never Smoker  . Smokeless tobacco: Never Used  Substance Use Topics  . Alcohol use: Yes    Alcohol/week: 0.0 oz    Comment: Occasionally     Current Outpatient Medications:  .  cetirizine (ZYRTEC) 10 MG tablet, Take by mouth., Disp: , Rfl:  .  clonazePAM (KLONOPIN) 0.5 MG tablet, Take 1 tablet (0.5 mg total) by mouth 2 (two) times daily., Disp: 60 tablet, Rfl: 4 .  fluticasone (FLONASE) 50 MCG/ACT nasal spray, Place into both nostrils daily., Disp: , Rfl:  .  lamoTRIgine (LAMICTAL) 150 MG tablet, Take 1 tablet (150 mg total) by mouth 2 (two) times daily., Disp: 60 tablet, Rfl: 1 .  norethindrone (AYGESTIN) 5 MG tablet, Take 5 mg by mouth daily., Disp: , Rfl:  .  risperiDONE (RISPERDAL) 0.25 MG tablet, Take 0.75 mg by mouth at bedtime., Disp: , Rfl: 2 .  topiramate (TOPAMAX) 25 MG tablet, Take 100 mg by mouth., Disp: , Rfl:  .  valACYclovir (VALTREX) 1000 MG tablet, Take 1 tablet (1,000 mg total) by mouth 2 (two) times daily., Disp: 20 tablet, Rfl: 0 .  VIIBRYD 40 MG  TABS, Take 1 tablet (40 mg total) by mouth daily., Disp: 30 tablet, Rfl: 2  Allergies  Allergen Reactions  . Nutritional Supplements Itching and Swelling    ONLY certain "FRESH FRUITS" cause throat itches & in spring, throat feels like it will swell.  . Prednisone Other (See Comments) and Palpitations    Severe mood swings and tachycardia  . Augmentin [Amoxicillin-Pot Clavulanate] Other (See Comments)    Mood swings     ROS  Constitutional: Positive for low-grade fevers and intentional weight loss.  Respiratory: Negative for cough and shortness of breath.   Cardiovascular: Negative for chest pain or palpitations.  Gastrointestinal: Negative for abdominal pain, no bowel changes.  Musculoskeletal: Negative for gait problem or joint swelling.  Skin: Negative for rash.  Neurological: Positive for lightheadedness and headache.  No other specific complaints in a complete review of systems (except as listed in HPI above).  Objective  Vitals:   02/13/18 0825  BP: 124/68  Pulse: 93  Resp: 18  Temp: 99.3 F (37.4 C)  TempSrc: Oral  SpO2: 99%  Weight: 229 lb 4.8 oz (104 kg)  Height: 5\' 5"  (1.651 m)    Body mass index is 38.16 kg/m.  Nursing Note and Vital Signs reviewed.  Physical Exam Constitutional: Patient appears well-developed and well-nourished. Obese. No distress.  HEENT: head atraumatic, normocephalic, pupils equal and reactive to light, TM's without erythema or bulging- bloody cerumen impacted at 12 oclock of TM in left; and 8 in Right, patient sts she uses qtips to clean and itch ears sometimes;  mild maxillary tenderness more on left; no frontal sinus tenderness , neck supple without lymphadenopathy, oropharynx pink and moist without exudate ,mucosal edema in nares Cardiovascular: Normal rate, regular rhythm, S1/S2 present.   Pulmonary/Chest: Effort normal and breath sounds clear.  Abdominal: Soft and non-tender, bowel sounds present  Neuro: A&Ox4; EOMs intact Psychiatric: Patient has a normal mood and affect. behavior is normal. Judgment and thought content normal.  No results found for this or any previous visit (from the past 72 hour(s)).  Assessment & Plan  1. Subacute maxillary sinusitis - Switch from claratin daily to xyzal for one month, continue flonase; if no improvement will send to ENT - doxycycline (VIBRA-TABS) 100 MG tablet; Take 1 tablet (100 mg total) by mouth 2  (two) times daily for 7 days.  Dispense: 14 tablet; Refill: 0    -Red flags and when to present for emergency care or RTC including fever >101.4F, chest pain, shortness of breath, new/worsening/un-resolving symptoms,  reviewed with patient at time of visit. Follow up and care instructions discussed and provided in AVS.

## 2018-02-24 ENCOUNTER — Telehealth: Payer: Self-pay | Admitting: Nurse Practitioner

## 2018-02-24 NOTE — Telephone Encounter (Signed)
Copied from [place] [phone]. Topic: Quick Communication - See Telephone Encounter >> Feb 24, 2018  2:17 PM Aurelio Brash B wrote: CRM for notification. See Telephone encounter for: 02/24/18.Pt was told to call back to let know if the antibiotic did not work from her 3/29  apt,  she states it did not work .

## 2018-02-25 NOTE — Telephone Encounter (Signed)
Left message for Tracey Perry to see how she is doing

## 2018-03-04 ENCOUNTER — Other Ambulatory Visit: Payer: Self-pay | Admitting: Nurse Practitioner

## 2018-03-04 DIAGNOSIS — J0100 Acute maxillary sinusitis, unspecified: Secondary | ICD-10-CM

## 2018-03-04 NOTE — Telephone Encounter (Signed)
Patient notified

## 2018-03-04 NOTE — Telephone Encounter (Signed)
Would like referral to [facility]. After antibiotic she is still not feeling well.

## 2018-04-29 ENCOUNTER — Ambulatory Visit: Payer: Self-pay | Admitting: Nurse Practitioner

## 2018-05-28 ENCOUNTER — Ambulatory Visit: Payer: Commercial Managed Care - PPO | Admitting: Family Medicine

## 2018-05-28 ENCOUNTER — Encounter: Payer: Self-pay | Admitting: Family Medicine

## 2018-05-28 VITALS — BP 120/72 | HR 91 | Temp 98.7°F | Resp 16 | Ht 68.0 in | Wt 232.5 lb

## 2018-05-28 DIAGNOSIS — M545 Low back pain, unspecified: Secondary | ICD-10-CM

## 2018-05-28 DIAGNOSIS — F3131 Bipolar disorder, current episode depressed, mild: Secondary | ICD-10-CM

## 2018-05-28 DIAGNOSIS — G43009 Migraine without aura, not intractable, without status migrainosus: Secondary | ICD-10-CM | POA: Diagnosis not present

## 2018-05-28 MED ORDER — TOPIRAMATE 50 MG PO TABS: 50.0000 mg | ORAL_TABLET | Freq: Two times a day (BID) | ORAL | 1 refills | Status: DC

## 2018-05-28 MED ORDER — MELOXICAM 15 MG PO TABS: 15.0000 mg | ORAL_TABLET | Freq: Every day | ORAL | 0 refills | Status: DC

## 2018-05-28 MED ORDER — RIZATRIPTAN BENZOATE 10 MG PO TABS: 10.0000 mg | ORAL_TABLET | ORAL | 0 refills | Status: DC | PRN

## 2018-05-28 NOTE — Patient Instructions (Signed)
Back Exercises The following exercises strengthen the muscles that help to support the back. They also help to keep the lower back flexible. Doing these exercises can help to prevent back pain or lessen existing pain. If you have back pain or discomfort, try doing these exercises 2-3 times each day or as told by your health care provider. When the pain goes away, do them once each day, but increase the number of times that you repeat the steps for each exercise (do more repetitions). If you do not have back pain or discomfort, do these exercises once each day or as told by your health care provider. Exercises Single Knee to Chest  Repeat these steps 3-5 times for each leg: 1. Lie on your back on a firm bed or the floor with your legs extended. 2. Bring one knee to your chest. Your other leg should stay extended and in contact with the floor. 3. Hold your knee in place by grabbing your knee or thigh. 4. Pull on your knee until you feel a gentle stretch in your lower back. 5. Hold the stretch for 10-30 seconds. 6. Slowly release and straighten your leg.  Pelvic Tilt  Repeat these steps 5-10 times: 1. Lie on your back on a firm bed or the floor with your legs extended. 2. Bend your knees so they are pointing toward the ceiling and your feet are flat on the floor. 3. Tighten your lower abdominal muscles to press your lower back against the floor. This motion will tilt your pelvis so your tailbone points up toward the ceiling instead of pointing to your feet or the floor. 4. With gentle tension and even breathing, hold this position for 5-10 seconds.  Cat-Cow  Repeat these steps until your lower back becomes more flexible: 1. Get into a hands-and-knees position on a firm surface. Keep your hands under your shoulders, and keep your knees under your hips. You may place padding under your knees for comfort. 2. Let your head hang down, and point your tailbone toward the floor so your lower back  becomes rounded like the back of a cat. 3. Hold this position for 5 seconds. 4. Slowly lift your head and point your tailbone up toward the ceiling so your back forms a sagging arch like the back of a cow. 5. Hold this position for 5 seconds.  Press-Ups  Repeat these steps 5-10 times: 1. Lie on your abdomen (face-down) on the floor. 2. Place your palms near your head, about shoulder-width apart. 3. While you keep your back as relaxed as possible and keep your hips on the floor, slowly straighten your arms to raise the top half of your body and lift your shoulders. Do not use your back muscles to raise your upper torso. You may adjust the placement of your hands to make yourself more comfortable. 4. Hold this position for 5 seconds while you keep your back relaxed. 5. Slowly return to lying flat on the floor.  Bridges  Repeat these steps 10 times: 1. Lie on your back on a firm surface. 2. Bend your knees so they are pointing toward the ceiling and your feet are flat on the floor. 3. Tighten your buttocks muscles and lift your buttocks off of the floor until your waist is at almost the same height as your knees. You should feel the muscles working in your buttocks and the back of your thighs. If you do not feel these muscles, slide your feet 1-2 inches farther away   from your buttocks. 4. Hold this position for 3-5 seconds. 5. Slowly lower your hips to the starting position, and allow your buttocks muscles to relax completely.  If this exercise is too easy, try doing it with your arms crossed over your chest. Abdominal Crunches  Repeat these steps 5-10 times: 1. Lie on your back on a firm bed or the floor with your legs extended. 2. Bend your knees so they are pointing toward the ceiling and your feet are flat on the floor. 3. Cross your arms over your chest. 4. Tip your chin slightly toward your chest without bending your neck. 5. Tighten your abdominal muscles and slowly raise your  trunk (torso) high enough to lift your shoulder blades a tiny bit off of the floor. Avoid raising your torso higher than that, because it can put too much stress on your low back and it does not help to strengthen your abdominal muscles. 6. Slowly return to your starting position.  Back Lifts Repeat these steps 5-10 times: 1. Lie on your abdomen (face-down) with your arms at your sides, and rest your forehead on the floor. 2. Tighten the muscles in your legs and your buttocks. 3. Slowly lift your chest off of the floor while you keep your hips pressed to the floor. Keep the back of your head in line with the curve in your back. Your eyes should be looking at the floor. 4. Hold this position for 3-5 seconds. 5. Slowly return to your starting position.  Contact a health care provider if:  Your back pain or discomfort gets much worse when you do an exercise.  Your back pain or discomfort does not lessen within 2 hours after you exercise. If you have any of these problems, stop doing these exercises right away. Do not do them again unless your health care provider says that you can. Get help right away if:  You develop sudden, severe back pain. If this happens, stop doing the exercises right away. Do not do them again unless your health care provider says that you can. This information is not intended to replace advice given to you by your health care provider. Make sure you discuss any questions you have with your health care provider. Document Released: 12/12/2004 Document Revised: 03/13/2016 Document Reviewed: 12/29/2014 Elsevier Interactive Patient Education  2017 [employer] Back Pain, Adult Many adults have back pain from time to time. Common causes of back pain include:  A strained muscle or ligament.  Wear and tear (degeneration) of the spinal disks.  Arthritis.  A hit to the back.  Back pain can be short-lived (acute) or last a long time (chronic). A physical exam, lab  tests, and imaging studies may be done to find the cause of your pain. Follow these instructions at home: Managing pain and stiffness  Take over-the-counter and prescription medicines only as told by your health care provider.  If directed, apply heat to the affected area as often as told by your health care provider. Use the heat source that your health care provider recommends, such as a moist heat pack or a heating pad. ? Place a towel between your skin and the heat source. ? Leave the heat on for 20-30 minutes. ? Remove the heat if your skin turns bright red. This is especially important if you are unable to feel pain, heat, or cold. You have a greater risk of getting burned.  If directed, apply ice to the injured area: ? Put ice in  a plastic bag. ? Place a towel between your skin and the bag. ? Leave the ice on for 20 minutes, 2-3 times a day for the first 2-3 days. Activity  Do not stay in bed. Resting more than 1-2 days can delay your recovery.  Take short walks on even surfaces as soon as you are able. Try to increase the length of time you walk each day.  Do not sit, drive, or stand in one place for more than 30 minutes at a time. Sitting or standing for long periods of time can put stress on your back.  Use proper lifting techniques. When you bend and lift, use positions that put less stress on your back: ? [place] your knees. ? Keep the load close to your body. ? Avoid twisting.  Exercise regularly as told by your health care provider. Exercising will help your back heal faster. This also helps prevent back injuries by keeping muscles strong and flexible.  Your health care provider may recommend that you see a physical therapist. This person can help you come up with a safe exercise program. Do any exercises as told by your physical therapist. Lifestyle  Maintain a healthy weight. Extra weight puts stress on your back and makes it difficult to have good posture.  Avoid  activities or situations that make you feel anxious or stressed. Learn ways to manage anxiety and stress. One way to manage stress is through exercise. Stress and anxiety increase muscle tension and can make back pain worse. General instructions  Sleep on a firm mattress in a comfortable position. Try lying on your side with your knees slightly bent. If you lie on your back, put a pillow under your knees.  Follow your treatment plan as told by your health care provider. This may include: ? Cognitive or behavioral therapy. ? Acupuncture or massage therapy. ? Meditation or yoga. Contact a health care provider if:  You have pain that is not relieved with rest or medicine.  You have increasing pain going down into your legs or buttocks.  Your pain does not improve in 2 weeks.  You have pain at night.  You lose weight.  You have a fever or chills. Get help right away if:  You develop new bowel or bladder control problems.  You have unusual weakness or numbness in your arms or legs.  You develop nausea or vomiting.  You develop abdominal pain.  You feel faint. Summary  Many adults have back pain from time to time. A physical exam, lab tests, and imaging studies may be done to find the cause of your pain.  Use proper lifting techniques. When you bend and lift, use positions that put less stress on your back.  Take over-the-counter and prescription medicines and apply heat or ice as directed by your health care provider. This information is not intended to replace advice given to you by your health care provider. Make sure you discuss any questions you have with your health care provider. Document Released: 11/04/2005 Document Revised: 12/09/2016 Document Reviewed: 12/09/2016 Elsevier Interactive Patient Education  [employer].

## 2018-05-28 NOTE — Progress Notes (Signed)
Name: Tracey Perry   MRN: [id number]    DOB: 1991-04-23   Date:05/28/2018       Progress Note  Subjective  Chief Complaint  Chief Complaint  Patient presents with  . Back Pain    low back pain for 2 weeks    HPI  Back pain Acute: first episodes of acute low back pain, started two weeks ago while changing her sheets and developed a catch on her lower back, since than pain is intermittent but daily. Described as sharp, lasts seconds and resolves when she adjusts her posture. No radiculitis symptoms, no bowel or bladder incontinence. No rashes. She is concerned because she never had this before.   Bipolar Disorder: currently seeing psychiatrist in [facility], last episode has depression for the couple of months, medication is getting adjusted and she is feeling better. She has a chronic history of suicidal thoughts. She states she will never do it because it takes too much efforts.   Morbid obesity: she only gained a few pounds since last visit. Discussed importance of exercise and healthy eating.   Migraine: sees neurologist and takes medications as prescribed, she states episodes are about twice a month. Sometimes she has to miss work because of it. Episodes can be associated with nausea and phonophobia. No photophobia. Pain is left frontal, described as throbbing and intense.   Patient Active Problem List   Diagnosis Date Noted  . Fever blister 07/29/2016  . OSA (obstructive sleep apnea) 05/11/2016  . Hypersomnia 12/25/2015  . Snoring 12/25/2015  . Cluster B personality disorder ([place]) 10/27/2015  . Episodic mood disorder ([place]) 10/27/2015  . H/O urinary tract infection 06/21/2015  . 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 Affective disorder ([place]) 06/21/2015  . Depression, major, recurrent, moderate ([place]) 06/21/2015  . Gastroparesis 06/21/2015  . Hx of cold sores 06/21/2015  . Bipolar affective disorder ([place]) 05/31/2015  . Generalized anxiety disorder 05/31/2015  . History of  pyelonephritis 05/30/2015  . Dysmenorrhea 05/30/2015  . History of asthma 05/30/2015  . Migraine without aura and responsive to treatment 05/30/2015  . Morbid obesity, unspecified obesity type ([place]) 05/30/2015  . Allergic rhinitis, seasonal 05/30/2015  . Vertigo 05/30/2015  . IBS (irritable bowel syndrome) 09/20/2013  . H/O cervical spine surgery 12/29/2012  . Vitamin D deficiency 11/23/2009  . History of mononucleosis 04/03/2009    Past Surgical History:  Procedure Laterality Date  . ADENOIDECTOMY    . APPENDECTOMY    . CHOLECYSTECTOMY    . LUMBAR DISC SURGERY    . TONSILLECTOMY    . TYMPANOSTOMY TUBE PLACEMENT      Family History  Problem Relation Age of Onset  . Depression Mother   . Hypertension Mother        controlled  . Allergic rhinitis Mother   . Anxiety disorder Mother   . Diabetes Father        controlled  . Hypertension Father   . Allergic rhinitis Father   . Depression Father   . Depression Brother   . Anxiety disorder Brother     Social History   Socioeconomic History  . Marital status: Single    Spouse name: Not on file  . Number of children: 0  . Years of education: Not on file  . Highest education level: Master's degree (e.g., MA, MS, MEng, MEd, MSW, MBA)  Occupational History  . Occupation: retail     Comment: she is applying for a position in a [employer]  .  Financial resource strain: Not on file  . Food insecurity:    Worry: Not on file    Inability: Not on file  . Transportation needs:    Medical: Not on file    Non-medical: Not on file  Tobacco Use  . Smoking status: Never Smoker  . Smokeless tobacco: Never Used  Substance and Sexual Activity  . Alcohol use: Yes    Alcohol/week: 0.0 oz    Comment: Occasionally  . Drug use: No  . Sexual activity: Never    Birth control/protection: IUD  Lifestyle  . Physical activity:    Days per week: Not on file    Minutes per session: Not on file  . Stress: Not on file   Relationships  . Social connections:    Talks on phone: Not on file    Gets together: Not on file    Attends religious service: Not on file    Active member of club or organization: Not on file    Attends meetings of clubs or organizations: Not on file    Relationship status: Not on file  . Intimate partner violence:    Fear of current or ex partner: Not on file    Emotionally abused: Not on file    Physically abused: Not on file    Forced sexual activity: Not on file  Other Topics Concern  . 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  cetirizine (ZYRTEC) 10 MG tablet, Take by mouth., Disp: , Rfl:  .  clonazePAM (KLONOPIN) 0.5 MG tablet, Take 1 tablet (0.5 mg total) by mouth 2 (two) times daily., Disp: 60 tablet, Rfl: 4 .  fluticasone (FLONASE) 50 MCG/ACT nasal spray, Place into both nostrils daily., Disp: , Rfl:  .  lamoTRIgine (LAMICTAL) 150 MG tablet, Take 1 tablet (150 mg total) by mouth 2 (two) times daily., Disp: 60 tablet, Rfl: 1 .  montelukast (SINGULAIR) 10 MG tablet, Take 10 mg by mouth every evening., Disp: , Rfl: 12 .  norethindrone (AYGESTIN) 5 MG tablet, Take 5 mg by mouth daily., Disp: , Rfl:  .  risperiDONE (RISPERDAL) 0.25 MG tablet, Take 0.75 mg by mouth at bedtime., Disp: , Rfl: 2 .  topiramate (TOPAMAX) 50 MG tablet, Take 1-2 tablets (50-100 mg total) by mouth 2 (two) times daily., Disp: 120 tablet, Rfl: 1 .  valACYclovir (VALTREX) 1000 MG tablet, Take 1 tablet (1,000 mg total) by mouth 2 (two) times daily., Disp: 20 tablet, Rfl: 0 .  VIIBRYD 40 MG TABS, Take 1 tablet (40 mg total) by mouth daily., Disp: 30 tablet, Rfl: 2 .  meloxicam (MOBIC) 15 MG tablet, Take 1 tablet (15 mg total) by mouth daily., Disp: 30 tablet, Rfl: 0 .  rizatriptan (MAXALT) 10 MG tablet, Take 1 tablet (10 mg total) by mouth as needed for migraine. May repeat in 2  hours if needed, Disp: 10 tablet, Rfl: 0  Allergies  Allergen Reactions  . Apple Itching and Swelling    ONLY certain "FRESH FRUITS" cause throat itches & in spring, throat feels like it will swell.  (apples, peaches, pears, plums, mango)  . Nutritional Supplements Itching and Swelling    ONLY certain "FRESH FRUITS" cause throat itches & in spring, throat feels like it will swell.  . Prednisone Other (See Comments) and Palpitations    Severe mood  swings and tachycardia  . Augmentin [Amoxicillin-Pot Clavulanate] Other (See Comments)    Mood swings     ROS  Constitutional: Negative for fever or weight change.  Respiratory: Negative for cough and shortness of breath.   Cardiovascular: Negative for chest pain or palpitations.  Gastrointestinal: Negative for abdominal pain, no bowel changes.  Musculoskeletal: Negative for gait problem or joint swelling.  Skin: Negative for rash.  Neurological: Negative for dizziness or headache.  No other specific complaints in a complete review of systems (except as listed in HPI above).  Objective  Vitals:   05/28/18 0810  BP: 120/72  Pulse: 91  Resp: 16  Temp: 98.7 F (37.1 C)  TempSrc: Oral  SpO2: 96%  Weight: 232 lb 8 oz (105.5 kg)  Height: 5\' 8"  (1.727 m)    Body mass index is 35.35 kg/m.  Physical Exam  Constitutional: Patient appears well-developed and well-nourished. Obese No distress.  HEENT: head atraumatic, normocephalic, pupils equal and reactive to light,  neck supple, throat within normal limits Cardiovascular: Normal rate, regular rhythm and normal heart sounds.  No murmur heard. No BLE edema. Pulmonary/Chest: Effort normal and breath sounds normal. No respiratory distress. Abdominal: Soft.  There is no tenderness. Psychiatric: Patient has a normal mood and affect. behavior is normal. Judgment and thought content normal. Muscular Skeletal: no pain during palpation of lumbar spine, negative straight leg raise, decrease in  flexion and extension normal lateral bending, normal rotation.    PHQ2/9: Depression screen [facility] 2/9 05/28/2018 08/07/2017 06/30/2017 12/25/2015 05/30/2015  Decreased Interest 0 0 0 0 0  Down, Depressed, Hopeless 0 0 1 0 1  PHQ - 2 Score 0 0 1 0 1  Altered sleeping 3 - - - -  Tired, decreased energy 2 - - - -  Change in appetite 3 - - - -  Feeling bad or failure about yourself  3 - - - -  Trouble concentrating 2 - - - -  Moving slowly or fidgety/restless 0 - - - -  Suicidal thoughts 2 - - - -  PHQ-9 Score 15 - - - -  Difficult doing work/chores Somewhat difficult - - - -  Some encounter information is confidential and restricted. Go to Review Flowsheets activity to see all data.    Continue follow up with psychiatrist.   Fall Risk: Fall Risk  05/28/2018 08/07/2017 06/30/2017 12/25/2015 05/30/2015  Falls in the past year? No No No No No    Assessment & Plan   1. Acute midline low back pain without sciatica  - meloxicam (MOBIC) 15 MG tablet; Take 1 tablet (15 mg total) by mouth daily.  Dispense: 30 tablet; Refill: 0  2. Bipolar affective disorder, currently depressed, mild ([place])  Keep follow up with psychiatrist   3. Morbid obesity, unspecified obesity type [facility])  Discussed with the patient the risk posed by an increased BMI. Discussed importance of portion control, calorie counting and at least 150 minutes of physical activity weekly. Avoid sweet beverages and drink more water. Eat at least 6 servings of fruit and vegetables daily   4. Migraine without aura and responsive to treatment  - topiramate (TOPAMAX) 50 MG tablet; Take 1-2 tablets (50-100 mg total) by mouth 2 (two) times daily.  Dispense: 120 tablet; Refill: 1

## 2018-06-24 ENCOUNTER — Ambulatory Visit: Payer: Commercial Managed Care - PPO | Admitting: Nurse Practitioner

## 2018-06-24 ENCOUNTER — Other Ambulatory Visit: Payer: Self-pay | Admitting: Nurse Practitioner

## 2018-06-24 ENCOUNTER — Encounter: Payer: Self-pay | Admitting: Nurse Practitioner

## 2018-06-24 VITALS — BP 104/60 | HR 88 | Temp 98.0°F | Resp 16 | Ht 65.0 in | Wt 232.5 lb

## 2018-06-24 DIAGNOSIS — G43009 Migraine without aura, not intractable, without status migrainosus: Secondary | ICD-10-CM

## 2018-06-24 MED ORDER — PROMETHAZINE HCL 25 MG PO TABS: 25.0000 mg | ORAL_TABLET | Freq: Three times a day (TID) | ORAL | 0 refills | Status: DC | PRN

## 2018-06-24 MED ORDER — PROCHLORPERAZINE MALEATE 5 MG PO TABS: 5.0000 mg | ORAL_TABLET | Freq: Four times a day (QID) | ORAL | 0 refills | Status: DC | PRN

## 2018-06-24 MED ORDER — KETOROLAC TROMETHAMINE 30 MG/ML IJ SOLN
30.0000 mg | Freq: Once | INTRAMUSCULAR | Status: AC
  Administered 2018-06-24: 30 mg via INTRAMUSCULAR

## 2018-06-24 NOTE — Progress Notes (Unsigned)
Name: Tracey Perry   MRN: [id number]    DOB: 08/10/1991   Date:06/24/2018       Progress Note  Subjective  Chief Complaint  No chief complaint on file.   HPI  ***  Patient Active Problem List   Diagnosis Date Noted  . Fever blister 07/29/2016  . OSA (obstructive sleep apnea) 05/11/2016  . Hypersomnia 12/25/2015  . Snoring 12/25/2015  . Cluster B personality disorder ([place]) 10/27/2015  . Episodic mood disorder ([place]) 10/27/2015  . H/O urinary tract infection 06/21/2015  . 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 Affective disorder ([place]) 06/21/2015  . Depression, major, recurrent, moderate ([place]) 06/21/2015  . Gastroparesis 06/21/2015  . Hx of cold sores 06/21/2015  . Bipolar affective disorder ([place]) 05/31/2015  . Generalized anxiety disorder 05/31/2015  . History of pyelonephritis 05/30/2015  . Dysmenorrhea 05/30/2015  . History of asthma 05/30/2015  . Migraine without aura and responsive to treatment 05/30/2015  . Morbid obesity, unspecified obesity type ([place]) 05/30/2015  . Allergic rhinitis, seasonal 05/30/2015  . Vertigo 05/30/2015  . IBS (irritable bowel syndrome) 09/20/2013  . H/O cervical spine surgery 12/29/2012  . Vitamin D deficiency 11/23/2009  . History of mononucleosis 04/03/2009    Past Medical History:  Diagnosis Date  . Allergy   . Anxiety   . Depression   . Gastroparesis   . Herpes simplex without complication   . Morbid obesity due to excess calories ([place])   . PMDD (premenstrual dysphoric disorder)   . Vertigo   . Vitamin D deficiency     Past Surgical History:  Procedure Laterality Date  . ADENOIDECTOMY    . APPENDECTOMY    . CHOLECYSTECTOMY    . LUMBAR DISC SURGERY    . TONSILLECTOMY    . TYMPANOSTOMY TUBE PLACEMENT      Social History   Tobacco Use  . Smoking status: Never Smoker  . Smokeless tobacco: Never Used  Substance Use Topics  . Alcohol use: Yes    Alcohol/week: 0.0 oz    Comment: Occasionally     Current  Outpatient Medications:  .  cetirizine (ZYRTEC) 10 MG tablet, Take by mouth., Disp: , Rfl:  .  clonazePAM (KLONOPIN) 0.5 MG tablet, Take 1 tablet (0.5 mg total) by mouth 2 (two) times daily., Disp: 60 tablet, Rfl: 4 .  fluticasone (FLONASE) 50 MCG/ACT nasal spray, Place into both nostrils daily., Disp: , Rfl:  .  lamoTRIgine (LAMICTAL) 150 MG tablet, Take 1 tablet (150 mg total) by mouth 2 (two) times daily., Disp: 60 tablet, Rfl: 1 .  LamoTRIgine 300 MG TB24 24 hour tablet, Take 1 tablet by mouth at bedtime., Disp: , Rfl: 2 .  LamoTRIgine 50 MG TB24 24 hour tablet, Take 1 tablet by mouth daily before breakfast., Disp: , Rfl: 2 .  meloxicam (MOBIC) 15 MG tablet, Take 1 tablet (15 mg total) by mouth daily., Disp: 30 tablet, Rfl: 0 .  montelukast (SINGULAIR) 10 MG tablet, Take 10 mg by mouth every evening., Disp: , Rfl: 12 .  norethindrone (AYGESTIN) 5 MG tablet, Take 5 mg by mouth daily., Disp: , Rfl:  .  risperiDONE (RISPERDAL) 0.5 MG tablet, Take 1 tablet by mouth at bedtime., Disp: , Rfl: 2 .  rizatriptan (MAXALT) 10 MG tablet, Take 1 tablet (10 mg total) by mouth as needed for migraine. May repeat in 2 hours if needed, Disp: 10 tablet, Rfl: 0 .  topiramate (TOPAMAX) 50 MG tablet, Take 1-2 tablets (50-100 mg total) by mouth  2 (two) times daily., Disp: 120 tablet, Rfl: 1 .  valACYclovir (VALTREX) 1000 MG tablet, Take 1 tablet (1,000 mg total) by mouth 2 (two) times daily., Disp: 20 tablet, Rfl: 0 .  VIIBRYD 10 MG TABS, Take 30 mg by mouth daily., Disp: , Rfl: 2 .  VIIBRYD 40 MG TABS, Take 1 tablet (40 mg total) by mouth daily., Disp: 30 tablet, Rfl: 2  Allergies  Allergen Reactions  . Apple Itching and Swelling    ONLY certain "FRESH FRUITS" cause throat itches & in spring, throat feels like it will swell.  (apples, peaches, pears, plums, mango)  . Nutritional Supplements Itching and Swelling    ONLY certain "FRESH FRUITS" cause throat itches & in spring, throat feels like it will swell.  .  Prednisone Other (See Comments) and Palpitations    Severe mood swings and tachycardia  . Augmentin [Amoxicillin-Pot Clavulanate] Other (See Comments)    Mood swings    ROS  ***  No other specific complaints in a complete review of systems (except as listed in HPI above).  Objective  There were no vitals filed for this visit. ***  There is no height or weight on file to calculate BMI.  Nursing Note and Vital Signs reviewed.  Physical Exam  ***   No results found for this or any previous visit (from the past 48 hour(s)).  Assessment & Plan  There are no diagnoses linked to this encounter.   -Red flags and when to present for emergency care or RTC including fever >101.59F, chest pain, shortness of breath, new/worsening/un-resolving symptoms, *** reviewed with patient at time of visit. Follow up and care instructions discussed and provided in AVS. -Reviewed Health Maintenance: ***

## 2018-06-24 NOTE — Patient Instructions (Addendum)
-   Drink plenty of water- recommend 64 ounces a day - Shot of Toradol in the clinic; then can pick up phenergan and compazine at the drug store- take as prescribed. If needed can additionally take 12.5mg  of benadryl.    Migraine Headache A migraine headache is a very strong throbbing pain on one side or both sides of your head. Migraines can also cause other symptoms. Talk with your doctor about what things may bring on (trigger) your migraine headaches. Follow these instructions at home: Medicines  Take over-the-counter and prescription medicines only as told by your doctor.  Do not drive or use heavy machinery while taking prescription pain medicine.  To prevent or treat constipation while you are taking prescription pain medicine, your doctor may recommend that you: ? Drink enough fluid to keep your pee (urine) clear or pale yellow. ? Take over-the-counter or prescription medicines. ? Eat foods that are high in fiber. These include fresh fruits and vegetables, whole grains, and beans. ? Limit foods that are high in fat and processed sugars. These include fried and sweet foods. Lifestyle  Avoid alcohol.  Do not use any products that contain nicotine or tobacco, such as cigarettes and e-cigarettes. If you need help quitting, ask your doctor.  Get at least 8 hours of sleep every night.  Limit your stress. General instructions   Keep a journal to find out what may bring on your migraines. For example, write down: ? What you eat and drink. ? How much sleep you get. ? Any change in what you eat or drink. ? Any change in your medicines.  If you have a migraine: ? Avoid things that make your symptoms worse, such as bright lights. ? It may help to lie down in a dark, quiet room. ? Do not drive or use heavy machinery. ? Ask your doctor what activities are safe for you.  Keep all follow-up visits as told by your doctor. This is important. Contact a doctor if:  You get a migraine  that is different or worse than your usual migraines. Get help right away if:  Your migraine gets very bad.  You have a fever.  You have a stiff neck.  You have trouble seeing.  Your muscles feel weak or like you cannot control them.  You start to lose your balance a lot.  You start to have trouble walking.  You pass out (faint). This information is not intended to replace advice given to you by your health care provider. Make sure you discuss any questions you have with your health care provider. Document Released: 08/13/2008 Document Revised: 05/24/2016 Document Reviewed: 04/22/2016 Elsevier Interactive Patient Education  2018 [employer].

## 2018-06-24 NOTE — Progress Notes (Addendum)
Name: Tracey Perry   MRN: [id number]    DOB: 1991-08-04   Date:06/24/2018       Progress Note  Subjective  Chief Complaint  Chief Complaint  Patient presents with  . Migraine    x 3 days throbbing and intermittent.    HPI  Migraine started Monday at 3 am woke up with it; left sided migraine- photosensitivity; nausea and vomiting- worst on Monday, tried maxalt without relief. Takes daily topamax for prevention for about 6 months. States was getting migraines weekly and since preventative has been getting them weekly. This is the worst one she has had in a year.   Patient Active Problem List   Diagnosis Date Noted  . Fever blister 07/29/2016  . OSA (obstructive sleep apnea) 05/11/2016  . Hypersomnia 12/25/2015  . Snoring 12/25/2015  . Cluster B personality disorder ([place]) 10/27/2015  . Episodic mood disorder ([place]) 10/27/2015  . H/O urinary tract infection 06/21/2015  . 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 Affective disorder ([place]) 06/21/2015  . Depression, major, recurrent, moderate ([place]) 06/21/2015  . Gastroparesis 06/21/2015  . Hx of cold sores 06/21/2015  . Bipolar affective disorder ([place]) 05/31/2015  . Generalized anxiety disorder 05/31/2015  . History of pyelonephritis 05/30/2015  . Dysmenorrhea 05/30/2015  . History of asthma 05/30/2015  . Migraine without aura and responsive to treatment 05/30/2015  . Morbid obesity, unspecified obesity type ([place]) 05/30/2015  . Allergic rhinitis, seasonal 05/30/2015  . Vertigo 05/30/2015  . IBS (irritable bowel syndrome) 09/20/2013  . H/O cervical spine surgery 12/29/2012  . Vitamin D deficiency 11/23/2009  . History of mononucleosis 04/03/2009    Past Medical History:  Diagnosis Date  . Allergy   . Anxiety   . Depression   . Gastroparesis   . Herpes simplex without complication   . Morbid obesity due to excess calories ([place])   . PMDD (premenstrual dysphoric disorder)   . Vertigo   . Vitamin D deficiency     Past  Surgical History:  Procedure Laterality Date  . ADENOIDECTOMY    . APPENDECTOMY    . CHOLECYSTECTOMY    . LUMBAR DISC SURGERY    . TONSILLECTOMY    . TYMPANOSTOMY TUBE PLACEMENT      Social History   Tobacco Use  . Smoking status: Never Smoker  . Smokeless tobacco: Never Used  Substance Use Topics  . Alcohol use: Yes    Alcohol/week: 0.0 oz    Comment: Occasionally     Current Outpatient Medications:  .  cetirizine (ZYRTEC) 10 MG tablet, Take by mouth., Disp: , Rfl:  .  clonazePAM (KLONOPIN) 0.5 MG tablet, Take 1 tablet (0.5 mg total) by mouth 2 (two) times daily., Disp: 60 tablet, Rfl: 4 .  fluticasone (FLONASE) 50 MCG/ACT nasal spray, Place into both nostrils daily., Disp: , Rfl:  .  LamoTRIgine 300 MG TB24 24 hour tablet, Take 1 tablet by mouth at bedtime., Disp: , Rfl: 2 .  LamoTRIgine 50 MG TB24 24 hour tablet, Take 1 tablet by mouth daily before breakfast., Disp: , Rfl: 2 .  meloxicam (MOBIC) 15 MG tablet, Take 1 tablet (15 mg total) by mouth daily., Disp: 30 tablet, Rfl: 0 .  montelukast (SINGULAIR) 10 MG tablet, Take 10 mg by mouth every evening., Disp: , Rfl: 12 .  norethindrone (AYGESTIN) 5 MG tablet, Take 5 mg by mouth daily., Disp: , Rfl:  .  risperiDONE (RISPERDAL) 0.5 MG tablet, Take 1 tablet by mouth at bedtime., Disp: , Rfl:  2 .  rizatriptan (MAXALT) 10 MG tablet, Take 1 tablet (10 mg total) by mouth as needed for migraine. May repeat in 2 hours if needed, Disp: 10 tablet, Rfl: 0 .  topiramate (TOPAMAX) 50 MG tablet, Take 1-2 tablets (50-100 mg total) by mouth 2 (two) times daily., Disp: 120 tablet, Rfl: 1 .  valACYclovir (VALTREX) 1000 MG tablet, Take 1 tablet (1,000 mg total) by mouth 2 (two) times daily., Disp: 20 tablet, Rfl: 0 .  VIIBRYD 10 MG TABS, Take 30 mg by mouth daily., Disp: , Rfl: 2 .  prochlorperazine (COMPAZINE) 5 MG tablet, Take 1 tablet (5 mg total) by mouth every 6 (six) hours as needed for nausea or vomiting., Disp: 2 tablet, Rfl: 0 .   promethazine (PHENERGAN) 25 MG tablet, Take 1 tablet (25 mg total) by mouth every 8 (eight) hours as needed for nausea or vomiting., Disp: 5 tablet, Rfl: 0  Allergies  Allergen Reactions  . Apple Itching and Swelling    ONLY certain "FRESH FRUITS" cause throat itches & in spring, throat feels like it will swell.  (apples, peaches, pears, plums, mango)  . Nutritional Supplements Itching and Swelling    ONLY certain "FRESH FRUITS" cause throat itches & in spring, throat feels like it will swell.  . Prednisone Other (See Comments) and Palpitations    Severe mood swings and tachycardia  . Augmentin [Amoxicillin-Pot Clavulanate] Other (See Comments)    Mood swings    Review of Systems  HENT: Negative for sinus pain, sore throat and tinnitus.   Eyes: Positive for photophobia. Negative for blurred vision and double vision.  Respiratory: Negative for shortness of breath.   Cardiovascular: Negative for chest pain and palpitations.  Gastrointestinal: Positive for nausea and vomiting. Negative for abdominal pain, constipation and diarrhea.  Neurological: Positive for dizziness (associated with migraines) and headaches. Negative for sensory change, speech change, seizures and weakness.  Psychiatric/Behavioral: The patient has insomnia (due to pain).      No other specific complaints in a complete review of systems (except as listed in HPI above).  Objective  Vitals:   06/24/18 0935 06/24/18 1014  BP: 104/60   Pulse: (!) 115 88  Resp: 16   Temp: 98 F (36.7 C)   TempSrc: Oral   SpO2: 100% 96%  Weight: 232 lb 8 oz (105.5 kg)   Height: 5\' 5"  (1.651 m)      Body mass index is 38.69 kg/m.  Nursing Note and Vital Signs reviewed.  Physical Exam  Constitutional: She is oriented to person, place, and time. She appears well-developed and well-nourished. She is cooperative.  HENT:  Head: Normocephalic.  Right Ear: Hearing normal.  Left Ear: Hearing normal.  Nose: Nose normal.   Mouth/Throat: Oropharynx is clear and moist and mucous membranes are normal.  Cardiovascular: Normal rate, regular rhythm and normal heart sounds.  No lower extremity edema noted.  Pulmonary/Chest: Effort normal and breath sounds normal.  Abdominal: Soft. Normal appearance and bowel sounds are normal.  Neurological: She is alert and oriented to person, place, and time. No sensory deficit.  Psychiatric: She has a normal mood and affect. Her speech is normal and behavior is normal. Thought content normal.     No results found for this or any previous visit (from the past 48 hour(s)).  Assessment & Plan  1. Migraine without aura and responsive to treatment -discussed risks and benefits of medications and migraine prevention and alternate migraine prevention options.  - ketorolac (TORADOL) 30 MG/ML  injection 30 mg - promethazine (PHENERGAN) 25 MG tablet; Take 1 tablet (25 mg total) by mouth every 8 (eight) hours as needed for nausea or vomiting.  Dispense: 5 tablet; Refill: 0 - prochlorperazine (COMPAZINE) 5 MG tablet; Take 1 tablet (5 mg total) by mouth every 6 (six) hours as needed for nausea or vomiting.  Dispense: 2 tablet; Refill: 0   -Red flags and when to present for emergency care or RTC including fever >101.22F,  unilateral weakness, new/worsening/un-resolving symptoms, reviewed with patient at time of visit. Follow up and care instructions discussed and provided in AVS.  ------------------------------------ I have reviewed this encounter including the documentation in this note and/or discussed this patient with the provider, Suezanne Cheshire DNP AGNP-C. I am certifying that I agree with the content of this note as supervising physician. Enid Derry, [facility] 06/24/2018, 1:17 PM

## 2018-06-25 ENCOUNTER — Encounter: Payer: Self-pay | Admitting: Nurse Practitioner

## 2018-06-25 ENCOUNTER — Ambulatory Visit: Payer: Commercial Managed Care - PPO | Admitting: Nurse Practitioner

## 2018-06-25 VITALS — BP 98/66 | HR 96 | Temp 98.7°F | Resp 16 | Ht 65.0 in | Wt 233.2 lb

## 2018-06-25 DIAGNOSIS — G43009 Migraine without aura, not intractable, without status migrainosus: Secondary | ICD-10-CM | POA: Diagnosis not present

## 2018-06-25 MED ORDER — KETOROLAC TROMETHAMINE 30 MG/ML IJ SOLN
30.0000 mg | Freq: Once | INTRAMUSCULAR | Status: AC
  Administered 2018-06-25: 30 mg via INTRAMUSCULAR

## 2018-06-25 MED ORDER — BUTALBITAL-APAP-CAFFEINE 50-325-40 MG PO TABS: 1.0000 | ORAL_TABLET | ORAL | 0 refills | Status: DC | PRN

## 2018-06-25 NOTE — Progress Notes (Signed)
Name: Tracey Perry   MRN: [id number]    DOB: Jan 29, 1991   Date:06/25/2018       Progress Note  Subjective  Chief Complaint  Chief Complaint  Patient presents with  . Migraine    patient returns for intermittent headaches. patient has not found any relief.   . Nausea  . Photophobia  . Work note    HPI  Was seen yesterday- given IM Toradol, compazine and phenergan PO at home and migraine resolved 1:30pm and then she woke up this morning and had a migraine around 9am- states it has been constant baseline pain but the intermittently gets worse. Left side headache, photosensitivity, no nausea or vomiting. Hasn't taken anything today.   Has klonopin- hasnt; taken it in months just takes it as needed for emergencies.    Patient Active Problem List   Diagnosis Date Noted  . Fever blister 07/29/2016  . OSA (obstructive sleep apnea) 05/11/2016  . Hypersomnia 12/25/2015  . Snoring 12/25/2015  . Cluster B personality disorder ([place]) 10/27/2015  . Episodic mood disorder ([place]) 10/27/2015  . H/O urinary tract infection 06/21/2015  . 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 Affective disorder ([place]) 06/21/2015  . Depression, major, recurrent, moderate ([place]) 06/21/2015  . Gastroparesis 06/21/2015  . Hx of cold sores 06/21/2015  . Bipolar affective disorder ([place]) 05/31/2015  . Generalized anxiety disorder 05/31/2015  . History of pyelonephritis 05/30/2015  . Dysmenorrhea 05/30/2015  . History of asthma 05/30/2015  . Migraine without aura and responsive to treatment 05/30/2015  . Morbid obesity, unspecified obesity type ([place]) 05/30/2015  . Allergic rhinitis, seasonal 05/30/2015  . Vertigo 05/30/2015  . IBS (irritable bowel syndrome) 09/20/2013  . H/O cervical spine surgery 12/29/2012  . Vitamin D deficiency 11/23/2009  . History of mononucleosis 04/03/2009    Past Medical History:  Diagnosis Date  . Allergy   . Anxiety   . Depression   . Gastroparesis   . Herpes simplex without  complication   . Morbid obesity due to excess calories ([place])   . PMDD (premenstrual dysphoric disorder)   . Vertigo   . Vitamin D deficiency     Past Surgical History:  Procedure Laterality Date  . ADENOIDECTOMY    . APPENDECTOMY    . CHOLECYSTECTOMY    . LUMBAR DISC SURGERY    . TONSILLECTOMY    . TYMPANOSTOMY TUBE PLACEMENT      Social History   Tobacco Use  . Smoking status: Never Smoker  . Smokeless tobacco: Never Used  Substance Use Topics  . Alcohol use: Yes    Alcohol/week: 0.0 standard drinks    Comment: Occasionally     Current Outpatient Medications:  .  levonorgestrel (MIRENA) 20 MCG/24HR IUD, 1 Intra Uterine Device by Intrauterine route once., Disp: , Rfl:  .  butalbital-acetaminophen-caffeine (FIORICET, ESGIC) 50-325-40 MG tablet, Take 1 tablet by mouth every 4 (four) hours as needed for up to 5 doses for headache., Disp: 6 tablet, Rfl: 0 .  cetirizine (ZYRTEC) 10 MG tablet, Take by mouth., Disp: , Rfl:  .  clonazePAM (KLONOPIN) 0.5 MG tablet, Take 1 tablet (0.5 mg total) by mouth 2 (two) times daily., Disp: 60 tablet, Rfl: 4 .  fluticasone (FLONASE) 50 MCG/ACT nasal spray, Place into both nostrils daily., Disp: , Rfl:  .  LamoTRIgine 300 MG TB24 24 hour tablet, Take 1 tablet by mouth at bedtime., Disp: , Rfl: 2 .  LamoTRIgine 50 MG TB24 24 hour tablet, Take 1 tablet by mouth  daily before breakfast., Disp: , Rfl: 2 .  meloxicam (MOBIC) 15 MG tablet, Take 1 tablet (15 mg total) by mouth daily., Disp: 30 tablet, Rfl: 0 .  montelukast (SINGULAIR) 10 MG tablet, Take 10 mg by mouth every evening., Disp: , Rfl: 12 .  norethindrone (AYGESTIN) 5 MG tablet, Take 5 mg by mouth daily., Disp: , Rfl:  .  prochlorperazine (COMPAZINE) 5 MG tablet, Take 1 tablet (5 mg total) by mouth every 6 (six) hours as needed for nausea or vomiting., Disp: 2 tablet, Rfl: 0 .  promethazine (PHENERGAN) 25 MG tablet, Take 1 tablet (25 mg total) by mouth every 8 (eight) hours as needed for  nausea or vomiting., Disp: 5 tablet, Rfl: 0 .  risperiDONE (RISPERDAL) 0.5 MG tablet, Take 1 tablet by mouth at bedtime., Disp: , Rfl: 2 .  rizatriptan (MAXALT) 10 MG tablet, Take 1 tablet (10 mg total) by mouth as needed for migraine. May repeat in 2 hours if needed, Disp: 10 tablet, Rfl: 0 .  topiramate (TOPAMAX) 50 MG tablet, Take 1-2 tablets (50-100 mg total) by mouth 2 (two) times daily., Disp: 120 tablet, Rfl: 1 .  valACYclovir (VALTREX) 1000 MG tablet, Take 1 tablet (1,000 mg total) by mouth 2 (two) times daily., Disp: 20 tablet, Rfl: 0 .  VIIBRYD 10 MG TABS, Take 30 mg by mouth daily., Disp: , Rfl: 2  Current Facility-Administered Medications:  .  ketorolac (TORADOL) 30 MG/ML injection 30 mg, 30 mg, Intramuscular, Once, [place], Bethel Born, NP  Allergies  Allergen Reactions  . Apple Itching and Swelling    ONLY certain "FRESH FRUITS" cause throat itches & in spring, throat feels like it will swell.  (apples, peaches, pears, plums, mango)  . Nutritional Supplements Itching and Swelling    ONLY certain "FRESH FRUITS" cause throat itches & in spring, throat feels like it will swell.  . Prednisone Other (See Comments) and Palpitations    Severe mood swings and tachycardia  . Augmentin [Amoxicillin-Pot Clavulanate] Other (See Comments)    Mood swings    Review of Systems  Constitutional: Negative for fever.  HENT: Negative for tinnitus.   Eyes: Positive for photophobia. Negative for blurred vision, pain and redness.  Gastrointestinal: Negative for nausea and vomiting.  Neurological: Positive for headaches. Negative for sensory change and focal weakness.     No other specific complaints in a complete review of systems (except as listed in HPI above).  Objective  Vitals:   06/25/18 1146 06/25/18 1159  BP: 98/66   Pulse: (!) 112 96  Resp: 16   Temp: 98.7 F (37.1 C)   TempSrc: Oral   SpO2: 99%   Weight: 233 lb 3.2 oz (105.8 kg)   Height: 5\' 5"  (1.651 m)      Body  mass index is 38.81 kg/m.  Nursing Note and Vital Signs reviewed.  Physical Exam  Constitutional: She is oriented to person, place, and time. She appears well-developed and well-nourished.  Wearing sunglasses   HENT:  Head: Normocephalic and atraumatic.  Eyes: Pupils are equal, round, and reactive to light. Conjunctivae and EOM are normal. Right eye exhibits no discharge. Left eye exhibits no discharge.  Neck: Normal range of motion. Neck supple.  Cardiovascular: Normal rate and regular rhythm.  Pulmonary/Chest: Effort normal and breath sounds normal.  Neurological: She is alert and oriented to person, place, and time.  No pulsating or tender temporal areas  Skin: Skin is warm and dry.  Psychiatric: She has a normal mood  and affect. Her behavior is normal. Judgment and thought content normal.     No results found for this or any previous visit (from the past 48 hour(s)).  Assessment & Plan  1. Migraine without aura and responsive to treatment Discussed preventative biologics, increase water intake, rest, if unimproved may require urgent care follow-up for other abortive migraine measures.  - ketorolac (TORADOL) 30 MG/ML injection 30 mg - butalbital-acetaminophen-caffeine (FIORICET, ESGIC) 50-325-40 MG tablet; Take 1 tablet by mouth every 4 (four) hours as needed for up to 5 doses for headache.  Dispense: 6 tablet; Refill: 0  -Red flags and when to present for emergency care or RTC including fever >101.104F, chest pain,  new/worsening/un-resolving symptoms,  reviewed with patient at time of visit. Follow up and care instructions discussed and provided in AVS. -Reviewed Health Maintenance:

## 2018-06-25 NOTE — Patient Instructions (Addendum)
-   Look up amovig and emgality; requires failed preventatives and then more than 6 migraine days a month.   - Drink plenty of water.

## 2018-08-28 ENCOUNTER — Ambulatory Visit: Payer: Commercial Managed Care - PPO | Attending: Orthopedic Surgery | Admitting: Physical Therapy

## 2018-08-28 ENCOUNTER — Encounter: Payer: Self-pay | Admitting: Physical Therapy

## 2018-08-28 DIAGNOSIS — M25512 Pain in left shoulder: Secondary | ICD-10-CM | POA: Diagnosis present
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
## 2018-10-01 LAB — LIPID PANEL
Cholesterol: 159 (ref 0–200)
HDL: 49 (ref 35–70)
LDL Cholesterol: 100
Triglycerides: 50 (ref 40–160)

## 2018-10-01 LAB — HM PAP SMEAR: HM Pap smear: NORMAL

## 2018-10-01 LAB — HEMOGLOBIN A1C: HEMOGLOBIN A1C: 5

## 2018-10-19 ENCOUNTER — Ambulatory Visit: Payer: Self-pay | Admitting: Nurse Practitioner

## 2018-11-25 ENCOUNTER — Ambulatory Visit: Payer: Commercial Managed Care - PPO | Admitting: Nurse Practitioner

## 2018-11-25 ENCOUNTER — Encounter: Payer: Self-pay | Admitting: Nurse Practitioner

## 2018-11-25 DIAGNOSIS — G43009 Migraine without aura, not intractable, without status migrainosus: Secondary | ICD-10-CM | POA: Diagnosis not present

## 2018-11-25 MED ORDER — BUTALBITAL-APAP-CAFFEINE 50-325-40 MG PO TABS: 1.0000 | ORAL_TABLET | ORAL | 0 refills | Status: DC | PRN

## 2018-11-25 MED ORDER — ONDANSETRON 4 MG PO TBDP: 4.0000 mg | ORAL_TABLET | Freq: Three times a day (TID) | ORAL | 0 refills | Status: DC | PRN

## 2018-11-25 MED ORDER — KETOROLAC TROMETHAMINE 60 MG/2ML IM SOLN
60.0000 mg | Freq: Once | INTRAMUSCULAR | Status: AC
  Administered 2018-11-25: 30 mg via INTRAMUSCULAR

## 2018-11-25 MED ORDER — KETOROLAC TROMETHAMINE 30 MG/ML IJ SOLN: 30.0000 mg | Freq: Once | INTRAMUSCULAR | Status: DC

## 2018-11-25 NOTE — Progress Notes (Signed)
Name: Tracey Perry   MRN: [id number]    DOB: 29-Jan-1991   Date:11/25/2018       Progress Note  Subjective  Chief Complaint  Chief Complaint  Patient presents with  . Migraine    started this morning, sensitivity to light & sound  . Dizziness  . Nausea    HPI  Patient presents to clinic with migraine started this morning. Left sided with photosensitivity and phonosensitivity, nausea and dizziness. States has not had one this bad since august. Usually gets 1-2  milder headaches each month.  Has not tried anything yet. This feels like typical migraine.  Sees neurology, takes Topamax preventatively with modest effectiveness.  Patient Active Problem List   Diagnosis Date Noted  . Fever blister 07/29/2016  . OSA (obstructive sleep apnea) 05/11/2016  . Hypersomnia 12/25/2015  . Snoring 12/25/2015  . Cluster B personality disorder ([place]) 10/27/2015  . Episodic mood disorder ([place]) 10/27/2015  . H/O urinary tract infection 06/21/2015  . 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 Affective disorder ([place]) 06/21/2015  . Depression, major, recurrent, moderate ([place]) 06/21/2015  . Gastroparesis 06/21/2015  . Hx of cold sores 06/21/2015  . Bipolar affective disorder ([place]) 05/31/2015  . Generalized anxiety disorder 05/31/2015  . History of pyelonephritis 05/30/2015  . Dysmenorrhea 05/30/2015  . History of asthma 05/30/2015  . Migraine without aura and responsive to treatment 05/30/2015  . Morbid obesity, unspecified obesity type ([place]) 05/30/2015  . Allergic rhinitis, seasonal 05/30/2015  . Vertigo 05/30/2015  . IBS (irritable bowel syndrome) 09/20/2013  . H/O cervical spine surgery 12/29/2012  . Vitamin D deficiency 11/23/2009  . History of mononucleosis 04/03/2009    Past Medical History:  Diagnosis Date  . Allergy   . Anxiety   . Depression   . Gastroparesis   . Herpes simplex without complication   . Morbid obesity due to excess calories ([place])   . PMDD (premenstrual dysphoric  disorder)   . Vertigo   . Vitamin D deficiency     Past Surgical History:  Procedure Laterality Date  . ADENOIDECTOMY    . APPENDECTOMY    . CHOLECYSTECTOMY    . LUMBAR DISC SURGERY    . TONSILLECTOMY    . TYMPANOSTOMY TUBE PLACEMENT      Social History   Tobacco Use  . Smoking status: Never Smoker  . Smokeless tobacco: Never Used  Substance Use Topics  . Alcohol use: Not Currently    Alcohol/week: 0.0 standard drinks     Current Outpatient Medications:  .  butalbital-acetaminophen-caffeine (FIORICET, ESGIC) 50-325-40 MG tablet, Take 1 tablet by mouth every 4 (four) hours as needed for up to 5 doses for headache., Disp: 6 tablet, Rfl: 0 .  cetirizine (ZYRTEC) 10 MG tablet, Take by mouth., Disp: , Rfl:  .  clonazePAM (KLONOPIN) 0.5 MG tablet, Take 1 tablet (0.5 mg total) by mouth 2 (two) times daily., Disp: 60 tablet, Rfl: 4 .  fluticasone (FLONASE) 50 MCG/ACT nasal spray, Place into both nostrils daily., Disp: , Rfl:  .  LamoTRIgine 200 MG TB24 24 hour tablet, Take by mouth., Disp: , Rfl:  .  montelukast (SINGULAIR) 10 MG tablet, Take 10 mg by mouth every evening., Disp: , Rfl: 12 .  norethindrone (AYGESTIN) 5 MG tablet, Take 5 mg by mouth daily., Disp: , Rfl:  .  propranolol (INDERAL) 10 MG tablet, Take by mouth., Disp: , Rfl:  .  risperiDONE (RISPERDAL) 0.5 MG tablet, Take 1 tablet by mouth at bedtime., Disp: ,  Rfl: 2 .  rizatriptan (MAXALT) 10 MG tablet, Take 1 tablet (10 mg total) by mouth as needed for migraine. May repeat in 2 hours if needed, Disp: 10 tablet, Rfl: 0 .  topiramate (TOPAMAX) 25 MG tablet, TAKE 4 TABLETS (100 MG TOTAL) BY MOUTH TWO (2) TIMES A DAY., Disp: , Rfl: 6 .  VIIBRYD 10 MG TABS, Take 30 mg by mouth daily., Disp: , Rfl: 2 .  valACYclovir (VALTREX) 1000 MG tablet, Take 1 tablet (1,000 mg total) by mouth 2 (two) times daily. (Patient not taking: Reported on 11/25/2018), Disp: 20 tablet, Rfl: 0  Allergies  Allergen Reactions  . Apple Itching and  Swelling    ONLY certain "FRESH FRUITS" cause throat itches & in spring, throat feels like it will swell.  (apples, peaches, pears, plums, mango)  . Nutritional Supplements Itching and Swelling    ONLY certain "FRESH FRUITS" cause throat itches & in spring, throat feels like it will swell.  . Prednisone Other (See Comments) and Palpitations    Severe mood swings and tachycardia  . Amoxicillin-Pot Clavulanate Other (See Comments)    Mood swings Mood swings Mood swings    ROS    No other specific complaints in a complete review of systems (except as listed in HPI above).  Objective  Vitals:   11/25/18 0917  BP: 94/62  Pulse: 100  Resp: 16  Temp: 98.3 F (36.8 C)  TempSrc: Oral  SpO2: 99%  Weight: 247 lb (112 kg)     Body mass index is 41.1 kg/m.  Nursing Note and Vital Signs reviewed.  Physical Exam Constitutional:      Appearance: Normal appearance. She is well-developed.  HENT:     Head: Normocephalic and atraumatic.     Right Ear: Hearing normal.     Left Ear: Hearing normal.  Eyes:     Conjunctiva/sclera: Conjunctivae normal.  Cardiovascular:     Rate and Rhythm: Tachycardia present.  Pulmonary:     Effort: Pulmonary effort is normal.  Musculoskeletal: Normal range of motion.  Neurological:     Mental Status: She is alert and oriented to person, place, and time. Mental status is at baseline.     Comments: In dark room   Psychiatric:        Speech: Speech normal.        Behavior: Behavior normal. Behavior is cooperative.        Thought Content: Thought content normal.        Judgment: Judgment normal.      No results found for this or any previous visit (from the past 48 hour(s)).  Assessment & Plan 1. Migraine without aura and responsive to treatment Follow up with neurology- discussed other preventative options that may benefit her such as BB or biologics.  - butalbital-acetaminophen-caffeine (FIORICET, ESGIC) 50-325-40 MG tablet; Take 1 tablet  by mouth every 4 (four) hours as needed for up to 5 doses for headache.  Dispense: 5 tablet; Refill: 0 - ondansetron (ZOFRAN-ODT) 4 MG disintegrating tablet; Take 1 tablet (4 mg total) by mouth every 8 (eight) hours as needed for nausea or vomiting.  Dispense: 20 tablet; Refill: 0 - ketorolac (TORADOL) 30 MG/ML injection 30 mg

## 2018-12-04 ENCOUNTER — Encounter: Payer: Self-pay | Admitting: Family Medicine

## 2018-12-04 ENCOUNTER — Ambulatory Visit (INDEPENDENT_AMBULATORY_CARE_PROVIDER_SITE_OTHER): Payer: Commercial Managed Care - PPO | Admitting: Family Medicine

## 2018-12-04 VITALS — BP 104/60 | HR 91 | Temp 97.7°F | Resp 16 | Ht 65.5 in | Wt 246.9 lb

## 2018-12-04 DIAGNOSIS — Z01419 Encounter for gynecological examination (general) (routine) without abnormal findings: Secondary | ICD-10-CM | POA: Diagnosis not present

## 2018-12-04 DIAGNOSIS — B001 Herpesviral vesicular dermatitis: Secondary | ICD-10-CM

## 2018-12-04 DIAGNOSIS — Z23 Encounter for immunization: Secondary | ICD-10-CM

## 2018-12-04 MED ORDER — VALACYCLOVIR HCL 1 G PO TABS: 1000.0000 mg | ORAL_TABLET | Freq: Two times a day (BID) | ORAL | 1 refills | Status: DC

## 2018-12-04 NOTE — Progress Notes (Signed)
Name: Tracey Perry   MRN: [id number]    DOB: 23-Nov-1990   Date:12/04/2018       Progress Note  Subjective  Chief Complaint  Chief Complaint  Patient presents with  . Annual Exam    HPI   Patient presents for annual CPE .  Diet: she states not eating healthy since March, struggling emotionally and not ready to do anything about it at this time.  Exercise: not currently, discussed walking right after work for 10 minutes   USPSTF grade A and B recommendations    Office Visit from 12/04/2018 in [facility]  AUDIT-C Score  0     Depression:  Depression screen [facility] 2/9 12/04/2018 11/25/2018 06/25/2018 05/28/2018 08/07/2017  Decreased Interest 2 0 0 0 0  Down, Depressed, Hopeless _0 0 0  PHQ - 2 Score _1 0 0  Altered sleeping _2 -  Tired, decreased energy _3 -  Change in appetite 2 1 0 3 -  Feeling bad or failure about yourself  3 2 0 3 -  Trouble concentrating _4 -  Moving slowly or fidgety/restless 0 1 0 0 -  Suicidal thoughts 3 1 0 2 -  PHQ-9 Score _5 -  Difficult doing work/chores Extremely dIfficult Extremely dIfficult - Somewhat difficult -  Some encounter information is confidential and restricted. Go to Review Flowsheets activity to see all data.   GAD 7 : Generalized Anxiety Score 12/04/2018 11/25/2018 06/25/2018  Nervous, Anxious, on Edge 3 3 0  Control/stop worrying 3 1 0  Worry too much - different things 3 1 0  Trouble relaxing 2 3 0  Restless 0 1 0  Easily annoyed or irritable 3 2 0  Afraid - awful might happen 1 2 0  Total GAD 7 Score 15 13 0  Anxiety Difficulty - Very difficult -    Hypertension: BP Readings from Last 3 Encounters:  12/04/18 104/60  11/25/18 94/62  06/25/18 98/66   Obesity: Wt Readings from Last 3 Encounters:  12/04/18 246 lb 14.4 oz (112 kg)  11/25/18 247 lb (112 kg)  06/25/18 233 lb 3.2 oz (105.8 kg)   BMI Readings from Last 3 Encounters:  12/04/18 40.46 kg/m  11/25/18 41.10 kg/m   06/25/18 38.81 kg/m    Hep C Screening: N/A STD testing and prevention (HIV/chl/gon/syphilis): refused today, has not met deductible , she will get with gyn  Intimate partner violence: negative screen  Sexual History/Pain during Intercourse: not sexually active in many years  Menstrual History/LMP/Abnormal Bleeding: history of irregular cycles and PMS but seeing gyn and doing well on progesterone continuously use Incontinence Symptoms: no symptoms   Advanced Care Planning: A voluntary discussion about advance care planning including the explanation and discussion of advance directives.  Discussed health care proxy and Living will, and the patient was able to identify a health care proxy as mother .  Patient does not have a living will at present time. BRCA gene screening: N/A Cervical cancer screening: due for repeat Summer 2020 - with gyn   Osteoporosis Screening: discussed high calcium diet   Lipids:  Lab Results  Component Value Date   CHOL 171 09/24/2012   Lab Results  Component Value Date   HDL 62 09/24/2012   Lab Results  Component Value Date   LDLCALC 90 09/24/2012   Lab Results  Component Value Date   TRIG 97 09/24/2012  No results found for: CHOLHDL No results found for: LDLDIRECT  Glucose:  Glucose  Date Value Ref Range Status  04/08/2013 86 65 - 99 mg/dL Final    Skin cancer: discussed atypical lesions   Patient Active Problem List   Diagnosis Date Noted  . Fever blister 07/29/2016  . Menorrhagia with irregular cycle 06/20/2016  . OSA (obstructive sleep apnea) 05/11/2016  . Hypersomnia 12/25/2015  . Snoring 12/25/2015  . Cluster B personality disorder ([place]) 10/27/2015  . Episodic mood disorder ([place]) 10/27/2015  . H/O urinary tract infection 06/21/2015  . 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 Affective disorder ([place]) 06/21/2015  . Depression, major, recurrent, moderate ([place]) 06/21/2015  . Gastroparesis 06/21/2015  . Hx of cold sores 06/21/2015   . Bipolar affective disorder ([place]) 05/31/2015  . Generalized anxiety disorder 05/31/2015  . History of pyelonephritis 05/30/2015  . Dysmenorrhea 05/30/2015  . History of asthma 05/30/2015  . Migraine without aura and responsive to treatment 05/30/2015  . Morbid obesity, unspecified obesity type ([place]) 05/30/2015  . Allergic rhinitis, seasonal 05/30/2015  . Vertigo 05/30/2015  . IBS (irritable bowel syndrome) 09/20/2013  . H/O cervical spine surgery 12/29/2012  . Vitamin D deficiency 11/23/2009  . History of mononucleosis 04/03/2009    Past Surgical History:  Procedure Laterality Date  . ADENOIDECTOMY    . APPENDECTOMY    . CHOLECYSTECTOMY    . LUMBAR DISC SURGERY    . TONSILLECTOMY    . TYMPANOSTOMY TUBE PLACEMENT      Family History  Problem Relation Age of Onset  . Depression Mother   . Hypertension Mother        controlled  . Allergic rhinitis Mother   . Anxiety disorder Mother   . Diabetes Father        controlled  . Hypertension Father   . Allergic rhinitis Father   . Depression Father   . Depression Brother   . Anxiety disorder Brother     Social History   Socioeconomic History  . Marital status: Single    Spouse name: Not on file  . Number of children: 0  . Years of education: Not on file  . Highest education level: Master's degree (e.g., MA, MS, MEng, MEd, MSW, MBA)  Occupational History  . Occupation: retail     Comment: she is applying for a position in a [employer]  . Financial resource strain: Not hard at all  . Food insecurity:    Worry: Never true    Inability: Never true  . Transportation needs:    Medical: No    Non-medical: No  Tobacco Use  . Smoking status: Never Smoker  . Smokeless tobacco: Never Used  Substance and Sexual Activity  . Alcohol use: Not Currently    Alcohol/week: 0.0 standard drinks  . Drug use: No  . Sexual activity: Never    Birth control/protection: I.U.D.  Lifestyle  . Physical activity:    Days  per week: 0 days    Minutes per session: 0 min  . Stress: Not at all  Relationships  . Social connections:    Talks on phone: More than three times a week    Gets together: Once a week    Attends religious service: Never    Active member of club or organization: No    Attends meetings of clubs or organizations: Never    Relationship status: Never married  . Intimate partner violence:    Fear of current or ex partner:  No    Emotionally abused: No    Physically abused: No    Forced sexual activity: No  Other Topics Concern  . 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  cetirizine (ZYRTEC) 10 MG tablet, Take by mouth., Disp: , Rfl:  .  clonazePAM (KLONOPIN) 0.5 MG tablet, Take 1 tablet (0.5 mg total) by mouth 2 (two) times daily., Disp: 60 tablet, Rfl: 4 .  fluticasone (FLONASE) 50 MCG/ACT nasal spray, Place into both nostrils daily., Disp: , Rfl:  .  LamoTRIgine 200 MG TB24 24 hour tablet, Take by mouth., Disp: , Rfl:  .  montelukast (SINGULAIR) 10 MG tablet, Take 10 mg by mouth every evening., Disp: , Rfl: 12 .  norethindrone (AYGESTIN) 5 MG tablet, Take 5 mg by mouth daily., Disp: , Rfl:  .  risperiDONE (RISPERDAL) 1 MG tablet, Take 1 tablet by mouth at bedtime., Disp: , Rfl:  .  rizatriptan (MAXALT) 10 MG tablet, Take 1 tablet (10 mg total) by mouth as needed for migraine. May repeat in 2 hours if needed, Disp: 10 tablet, Rfl: 0 .  topiramate (TOPAMAX) 25 MG tablet, TAKE 4 TABLETS (100 MG TOTAL) BY MOUTH TWO (2) TIMES A DAY., Disp: , Rfl: 6 .  valACYclovir (VALTREX) 1000 MG tablet, Take 1 tablet (1,000 mg total) by mouth 2 (two) times daily., Disp: 30 tablet, Rfl: 1 .  VIIBRYD 10 MG TABS, Take 30 mg by mouth daily., Disp: , Rfl: 2 .  propranolol (INDERAL) 10 MG tablet, Take by mouth., Disp: , Rfl:   Allergies  Allergen Reactions  . Apple Itching and  Swelling    ONLY certain "FRESH FRUITS" cause throat itches & in spring, throat feels like it will swell.  (apples, peaches, pears, plums, mango)  . Nutritional Supplements Itching and Swelling    ONLY certain "FRESH FRUITS" cause throat itches & in spring, throat feels like it will swell.  . Prednisone Other (See Comments) and Palpitations    Severe mood swings and tachycardia  . Amoxicillin-Pot Clavulanate Other (See Comments)    Mood swings Mood swings Mood swings     ROS  Constitutional: Negative for fever or weight change.  Respiratory: Negative for cough and shortness of breath.   Cardiovascular: Negative for chest pain or palpitations.  Gastrointestinal: Negative for abdominal pain, no bowel changes.  Musculoskeletal: Negative for gait problem or joint swelling.  Skin: Negative for rash.  Neurological: Negative for dizziness or headache.  No other specific complaints in a complete review of systems (except as listed in HPI above).  Objective  Vitals:   12/04/18 0813  BP: 104/60  Pulse: 91  Resp: 16  Temp: 97.7 F (36.5 C)  TempSrc: Oral  SpO2: 99%  Weight: 246 lb 14.4 oz (112 kg)  Height: 5' 5.5" (1.664 m)    Body mass index is 40.46 kg/m.  Physical Exam  Constitutional: Patient appears well-developed and obese  No distress.  HENT: Head: Normocephalic and atraumatic. Ears: B TMs ok, no erythema or effusion; Nose: Nose normal. Mouth/Throat: Oropharynx is clear and moist. No oropharyngeal exudate.  Eyes: Conjunctivae and EOM are normal. Pupils are equal, round, and reactive to light. No scleral icterus.  Neck: Normal range of motion. Neck supple. No JVD present. No thyromegaly present.  Cardiovascular: Normal rate, regular rhythm and normal heart sounds.  No murmur heard. No BLE edema. Pulmonary/Chest: Effort normal and breath sounds normal. No respiratory distress. Abdominal: Soft. Bowel sounds are normal, no distension. There is no tenderness. no  masses Breast: no lumps or masses, no nipple discharge or rashes FEMALE GENITALIA:  Not done  RECTAL: not done  Musculoskeletal: Normal range of motion, no joint effusions. No gross deformities Neurological: he is alert and oriented to person, place, and time. No cranial nerve deficit. Coordination, balance, strength, speech and gait are normal.  Skin: Skin is warm and dry. No rash noted. No erythema.  Psychiatric: Patient has a normal mood and affect. behavior is normal. Judgment and thought content normal.  PHQ2/9: Depression screen [facility] 2/9 12/04/2018 11/25/2018 06/25/2018 05/28/2018 08/07/2017  Decreased Interest 2 0 0 0 0  Down, Depressed, Hopeless _0 0 0  PHQ - 2 Score _1 0 0  Altered sleeping _2 -  Tired, decreased energy _3 -  Change in appetite 2 1 0 3 -  Feeling bad or failure about yourself  3 2 0 3 -  Trouble concentrating _4 -  Moving slowly or fidgety/restless 0 1 0 0 -  Suicidal thoughts 3 1 0 2 -  PHQ-9 Score _5 -  Difficult doing work/chores Extremely dIfficult Extremely dIfficult - Somewhat difficult -  Some encounter information is confidential and restricted. Go to Review Flowsheets activity to see all data.     Fall Risk: Fall Risk  12/04/2018 11/25/2018 06/25/2018 06/24/2018 05/28/2018  Falls in the past year? 0 0 No No No  Number falls in past yr: 0 0 - - -  Injury with Fall? 0 0 - - -     Functional Status Survey: Is the patient deaf or have difficulty hearing?: No Does the patient have difficulty seeing, even when wearing glasses/contacts?: No Does the patient have difficulty concentrating, remembering, or making decisions?: No Does the patient have difficulty walking or climbing stairs?: No Does the patient have difficulty dressing or bathing?: No Does the patient have difficulty doing errands alone such as visiting a doctor's office or shopping?: No   Assessment & Plan  1. Well woman exam   2. Fever blister  - valACYclovir  (VALTREX) 1000 MG tablet; Take 1 tablet (1,000 mg total) by mouth 2 (two) times daily.  Dispense: 30 tablet; Refill: 1   -USPSTF grade A and B recommendations reviewed with patient; age-appropriate recommendations, preventive care, screening tests, etc discussed and encouraged; healthy living encouraged; see AVS for patient education given to patient -Discussed importance of 150 minutes of physical activity weekly, eat two servings of fish weekly, eat one serving of tree nuts ( cashews, pistachios, pecans, almonds.Marland Kitchen) every other day, eat 6 servings of fruit/vegetables daily and drink plenty of water and avoid sweet beverages.

## 2018-12-15 DIAGNOSIS — F3181 Bipolar II disorder: Secondary | ICD-10-CM | POA: Insufficient documentation

## 2018-12-22 MED ORDER — PROPRANOLOL HCL 10 MG PO TABS
10.00 | ORAL_TABLET | ORAL | Status: DC
Start: 2018-12-22 — End: 2018-12-22

## 2018-12-22 MED ORDER — RISPERIDONE 1 MG PO TABS
1.00 | ORAL_TABLET | ORAL | Status: DC
Start: 2018-12-22 — End: 2018-12-22

## 2018-12-22 MED ORDER — TOPIRAMATE 25 MG PO TABS
100.00 | ORAL_TABLET | ORAL | Status: DC
Start: 2018-12-22 — End: 2018-12-22

## 2018-12-22 MED ORDER — HYDROXYZINE HCL 25 MG PO TABS
25.00 | ORAL_TABLET | ORAL | Status: DC
Start: ? — End: 2018-12-22

## 2018-12-22 MED ORDER — TOPIRAMATE 25 MG PO TABS
50.00 | ORAL_TABLET | ORAL | Status: DC
Start: 2018-12-23 — End: 2018-12-22

## 2018-12-22 MED ORDER — NORETHINDRONE ACETATE 5 MG PO TABS
5.00 | ORAL_TABLET | ORAL | Status: DC
Start: 2018-12-22 — End: 2018-12-22

## 2018-12-22 MED ORDER — MONTELUKAST SODIUM 10 MG PO TABS
10.00 | ORAL_TABLET | ORAL | Status: DC
Start: 2018-12-23 — End: 2018-12-22

## 2018-12-22 MED ORDER — SERTRALINE HCL 50 MG PO TABS
50.00 | ORAL_TABLET | ORAL | Status: DC
Start: 2018-12-22 — End: 2018-12-22

## 2018-12-22 MED ORDER — LAMOTRIGINE 200 MG PO TABS
200.00 | ORAL_TABLET | ORAL | Status: DC
Start: 2018-12-22 — End: 2018-12-22

## 2018-12-22 MED ORDER — CETIRIZINE HCL 10 MG PO TABS
10.00 | ORAL_TABLET | ORAL | Status: DC
Start: 2018-12-23 — End: 2018-12-22

## 2019-04-20 ENCOUNTER — Telehealth: Payer: Self-pay | Admitting: *Deleted

## 2019-04-20 ENCOUNTER — Ambulatory Visit: Payer: Self-pay

## 2019-04-20 ENCOUNTER — Ambulatory Visit (INDEPENDENT_AMBULATORY_CARE_PROVIDER_SITE_OTHER): Payer: Commercial Managed Care - PPO | Admitting: Family Medicine

## 2019-04-20 ENCOUNTER — Encounter: Payer: Self-pay | Admitting: Family Medicine

## 2019-04-20 ENCOUNTER — Telehealth: Payer: Self-pay | Admitting: Family Medicine

## 2019-04-20 ENCOUNTER — Other Ambulatory Visit: Payer: Self-pay

## 2019-04-20 VITALS — Temp 99.9°F | Ht 65.5 in | Wt 246.8 lb

## 2019-04-20 DIAGNOSIS — Z20822 Contact with and (suspected) exposure to covid-19: Secondary | ICD-10-CM

## 2019-04-20 DIAGNOSIS — B349 Viral infection, unspecified: Secondary | ICD-10-CM

## 2019-04-20 DIAGNOSIS — J4520 Mild intermittent asthma, uncomplicated: Secondary | ICD-10-CM | POA: Diagnosis not present

## 2019-04-20 MED ORDER — MONTELUKAST SODIUM 10 MG PO TABS: 10.0000 mg | ORAL_TABLET | Freq: Every evening | ORAL | 1 refills | Status: DC

## 2019-04-20 NOTE — Progress Notes (Signed)
Name: Tracey Perry   MRN: [id number]    DOB: Mar 19, 1991   Date:04/20/2019       Progress Note  Subjective  Chief Complaint  Chief Complaint  Patient presents with  . Nausea    Started last night and came out of no where  . Fever    100.2 F but has been elevated fro a couple of days  . Sore Throat    Week and a half- runny nose   . Allergic Rhinitis     Hard to tell since she has allergies  . Fatigue    Onset-1 and a half weeks.    I connected with  Pearletha Furl  on 04/20/19 at  1:00 PM EDT by a video enabled telemedicine application and verified that I am speaking with the correct person using two identifiers.  I discussed the limitations of evaluation and management by telemedicine and the availability of in person appointments. The patient expressed understanding and agreed to proceed. Staff also discussed with the patient that there may be a patient responsible charge related to this service. Patient Location: at home Provider Location: [facility]   HPI  She states symptoms started with fatigue two weeks, but over the past couple of days she has been feeling worse, low grade fever tmax 100.2, sore throat, frontal headache, facial pressure. She states no nausea or vomiting, appetite is normal, no lack of sense of smell or taste. 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 She has a history of asthma and denies sob, wheezing or cough. She takes singulair and needs a refill. She lives alone.    Patient Active Problem List   Diagnosis Date Noted  . Fever blister 07/29/2016  . Menorrhagia with irregular cycle 06/20/2016  . OSA (obstructive sleep apnea) 05/11/2016  . Hypersomnia 12/25/2015  . Snoring 12/25/2015  . Cluster B personality disorder ([place]) 10/27/2015  . Episodic mood disorder ([place]) 10/27/2015  . H/O urinary tract infection 06/21/2015  . 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 Affective disorder ([place]) 06/21/2015  .  Depression, major, recurrent, moderate ([place]) 06/21/2015  . Gastroparesis 06/21/2015  . Hx of cold sores 06/21/2015  . Bipolar affective disorder ([place]) 05/31/2015  . Generalized anxiety disorder 05/31/2015  . History of pyelonephritis 05/30/2015  . Dysmenorrhea 05/30/2015  . History of asthma 05/30/2015  . Migraine without aura and responsive to treatment 05/30/2015  . Morbid obesity, unspecified obesity type ([place]) 05/30/2015  . Allergic rhinitis, seasonal 05/30/2015  . Vertigo 05/30/2015  . IBS (irritable bowel syndrome) 09/20/2013  . H/O cervical spine surgery 12/29/2012  . Vitamin D deficiency 11/23/2009  . History of mononucleosis 04/03/2009    Past Surgical History:  Procedure Laterality Date  . ADENOIDECTOMY    . APPENDECTOMY    . CHOLECYSTECTOMY    . LUMBAR DISC SURGERY    . TONSILLECTOMY    . TYMPANOSTOMY TUBE PLACEMENT      Family History  Problem Relation Age of Onset  . Depression Mother   . Hypertension Mother        controlled  . Allergic rhinitis Mother   . Anxiety disorder Mother   . Diabetes Father        controlled  . Hypertension Father   . Allergic rhinitis Father   . Depression Father   . Depression Brother   . Anxiety disorder Brother     Social History   Socioeconomic History  . Marital status: Single  Spouse name: Not on file  . Number of children: 0  . Years of education: Not on file  . Highest education level: Master's degree (e.g., MA, MS, MEng, MEd, MSW, MBA)  Occupational History  . Occupation: retail     Comment: she is applying for a position in a [employer]  . Financial resource strain: Not hard at all  . Food insecurity:    Worry: Never true    Inability: Never true  . Transportation needs:    Medical: No    Non-medical: No  Tobacco Use  . Smoking status: Never Smoker  . Smokeless tobacco: Never Used  Substance and Sexual Activity  . Alcohol use: Not Currently    Alcohol/week: 0.0 standard drinks  . Drug  use: No  . Sexual activity: Never    Birth control/protection: I.U.D.  Lifestyle  . Physical activity:    Days per week: 0 days    Minutes per session: 0 min  . Stress: Not at all  Relationships  . Social connections:    Talks on phone: More than three times a week    Gets together: Once a week    Attends religious service: Never    Active member of club or organization: No    Attends meetings of clubs or organizations: Never    Relationship status: Never married  . Intimate partner violence:    Fear of current or ex partner: No    Emotionally abused: No    Physically abused: No    Forced sexual activity: No  Other Topics Concern  . 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  Acetylcysteine (NAC) 600 MG CAPS, Take 1 capsule by mouth 3 (three) times daily., Disp: , Rfl:  .  cetirizine (ZYRTEC) 10 MG tablet, Take by mouth., Disp: , Rfl:  .  clonazePAM (KLONOPIN) 0.5 MG tablet, Take 1 tablet (0.5 mg total) by mouth 2 (two) times daily., Disp: 60 tablet, Rfl: 4 .  fluticasone (FLONASE) 50 MCG/ACT nasal spray, Place into both nostrils daily., Disp: , Rfl:  .  LamoTRIgine 200 MG TB24 24 hour tablet, Take 1 tablet by mouth 2 (two) times a day. , Disp: , Rfl:  .  MAGNESIUM OXIDE PO, Take 500 mg by mouth daily. , Disp: , Rfl:  .  montelukast (SINGULAIR) 10 MG tablet, Take 10 mg by mouth every evening., Disp: , Rfl: 12 .  norethindrone (AYGESTIN) 5 MG tablet, Take 5 mg by mouth daily., Disp: , Rfl:  .  propranolol (INDERAL) 10 MG tablet, Take 10 mg by mouth 2 (two) times daily. , Disp: , Rfl:  .  rizatriptan (MAXALT) 10 MG tablet, Take 1 tablet (10 mg total) by mouth as needed for migraine. May repeat in 2 hours if needed, Disp: 10 tablet, Rfl: 0 .  SERTRALINE HCL PO, Take 75 mg by mouth daily. , Disp: , Rfl:  .  topiramate (TOPAMAX) 50 MG tablet, Take by  mouth. 1 tablet every morning and 3 tablets at night, Disp: , Rfl: 6 .  valACYclovir (VALTREX) 1000 MG tablet, Take 1 tablet (1,000 mg total) by mouth 2 (two) times daily. (Patient taking differently: Take 1,000 mg by mouth 2 (two) times daily as needed. ), Disp: 30 tablet, Rfl: 1 .  risperiDONE (RISPERDAL) 1 MG tablet, Take 1 tablet  by mouth at bedtime., Disp: , Rfl:  .  VIIBRYD 10 MG TABS, Take 30 mg by mouth daily., Disp: , Rfl: 2  Allergies  Allergen Reactions  . Apple Itching and Swelling    ONLY certain "FRESH FRUITS" cause throat itches & in spring, throat feels like it will swell.  (apples, peaches, pears, plums, mango)  . Nutritional Supplements Itching and Swelling    ONLY certain "FRESH FRUITS" cause throat itches & in spring, throat feels like it will swell.  . Prednisone Other (See Comments) and Palpitations    Severe mood swings and tachycardia  . Amoxicillin-Pot Clavulanate Other (See Comments)    Mood swings Mood swings Mood swings    I personally reviewed active problem list, medication list, allergies, family history, social history with the patient/caregiver today.   ROS  Ten systems reviewed and is negative except as mentioned in HPI   Objective  Virtual encounter, vitals not obtained.  Body mass index is 40.45 kg/m.  Physical Exam  Awake, alert and oriented, tender during palpation of maxillary sinus   PHQ2/9: Depression screen [facility] 2/9 04/20/2019 12/04/2018 12/04/2018 11/25/2018 06/25/2018  Decreased Interest 3 2 2  0 0  Down, Depressed, Hopeless 3 3 3 2 1   PHQ - 2 Score 6 5 5 2 1   Altered sleeping 3 2 2 3 2   Tired, decreased energy 2 2 2 3 1   Change in appetite 3 2 2 1  0  Feeling bad or failure about yourself  3 3 3 2  0  Trouble concentrating 3 2 2 3 1   Moving slowly or fidgety/restless 3 0 0 1 0  Suicidal thoughts 0 3 3 1  0  PHQ-9 Score 23 19 19 16 5   Difficult doing work/chores Very difficult Extremely dIfficult Extremely dIfficult Extremely dIfficult  -  Some encounter information is confidential and restricted. Go to Review Flowsheets activity to see all data.   PHQ-2/9 Result is positive.  Under the care of psychiatrist and has follow up Thursday   Fall Risk: Fall Risk  04/20/2019 12/04/2018 11/25/2018 06/25/2018 06/24/2018  Falls in the past year? 0 0 0 No No  Number falls in past yr: 0 0 0 - -  Injury with Fall? 0 0 0 - -     Assessment & Plan  1. Mild intermittent asthma without complication  - montelukast (SINGULAIR) 10 MG tablet; Take 1 tablet (10 mg total) by mouth every evening.  Dispense: 90 tablet; Refill: 1  2. Viral syndrome  Sent request for COVID -19 testing to [place]. Explained to avoid medications that causes respiratory suppression, drinks fluids and may take otc medications for symptoms. Also advised to get a pulse oximeter if possible   I discussed the assessment and treatment plan with the patient. The patient was provided an opportunity to ask questions and all were answered. The patient agreed with the plan and demonstrated an understanding of the instructions.  The patient was advised to call back or seek an in-person evaluation if the symptoms worsen or if the condition fails to improve as anticipated.  I provided 15  minutes of non-face-to-face time during this encounter.

## 2019-04-20 NOTE — Telephone Encounter (Signed)
Testing requested by Dr. Ancil Boozer for fever, sore throat and headache.   Patient called and scheduled for testing on 04/20/19 at 2 pm at the [facility] site. Pt advised to wear a mask and remain in car for appt. Understanding verbalized.

## 2019-04-20 NOTE — Telephone Encounter (Signed)
Pt scheduled for testing on 04/20/19 at the [facility] location.

## 2019-04-20 NOTE — Telephone Encounter (Signed)
Pt c/o headache, fever this morning to 100.2, fatigue, diarrhea, nausea, sore throat, runny nose. Symptoms started last Wednesday. Denies SOB or any cough.nPt stated her headache, nausea and new onset of fever makes her feel like she is getting worse. Pt has h/o obesity and dx in high school with sports induced asthma.  Care advice given and pt verbalized understanding. Call transferred to [facility].  Reason for Disposition . [1] COVID-19 infection diagnosed or suspected AND [2] mild symptoms (fever, cough) AND [3] no trouble breathing or other complications  Answer Assessment - Initial Assessment Questions 1. COVID-19 DIAGNOSIS: "Who made your Coronavirus (COVID-19) diagnosis?" "Was it confirmed by a positive lab test?" If not diagnosed by a HCP, ask "Are there lots of cases (community spread) where you live?" (See public health department website, if unsure)   * MAJOR community spread: high number of cases; numbers of cases are increasing; many people hospitalized.   * MINOR community spread: low number of cases; not increasing; few or no people hospitalized     minor 2. ONSET: "When did the COVID-19 symptoms start?"      Last Wednesday 3. WORST SYMPTOM: "What is your worst symptom?" (e.g., cough, fever, shortness of breath, muscle aches)     Nausea, headache, sore throat 4. COUGH: "Do you have a cough?" If so, ask: "How bad is the cough?"       no 5. FEVER: "Do you have a fever?" If so, ask: "What is your temperature, how was it measured, and when did it start?"     100.2, oral thermometer, started this 6. RESPIRATORY STATUS: "Describe your breathing?" (e.g., shortness of breath, wheezing, unable to speak)      normal 7. BETTER-SAME-WORSE: "Are you getting better, staying the same or getting worse compared to yesterday?"  If getting worse, ask, "In what way?"     Worse-because of nausea, fever, headache 8. HIGH RISK DISEASE: "Do you have any chronic medical problems?" (e.g., asthma, heart  or lung disease, weak immune system, etc.)     Sports induced asthma in [place] 9. PREGNANCY: "Is there any chance you are pregnant?" "When was your last menstrual period?"     No-LMP- on med hasn't had period in 3 years 10. OTHER SYMPTOMS: "Do you have any other symptoms?"  (e.g., runny nose, headache, sore throat, loss of smell)       Headache, fatigue, runny nose, sore throat, nausea, diarrhea (loose), fever, fever  Protocols used: CORONAVIRUS (COVID-19) DIAGNOSED OR SUSPECTED-A-AH

## 2019-04-21 LAB — NOVEL CORONAVIRUS, NAA: SARS-CoV-2, NAA: NOT DETECTED

## 2019-04-22 ENCOUNTER — Encounter: Payer: Self-pay | Admitting: Family Medicine

## 2019-04-22 ENCOUNTER — Ambulatory Visit: Payer: Self-pay

## 2019-04-22 NOTE — Telephone Encounter (Signed)
Pt called stating she has had sinus congestion fever sore throat and headache for 1 week  She has tested negative for the COVID-19.  She has pain just above her left eye form sinus pressure.  Her temperature has been low grade today 100. Nasal discharge is clear.  She states she has allergies. Care advice read to patient. Patient verbalized understanding of all instructions. Note will be routed to office for evaluation in the AM.   Reason for Disposition . [1] Sinus pain (not just congestion) AND [2] fever  Answer Assessment - Initial Assessment Questions 1. LOCATION: "Where does it hurt?"      Left above eye brow pressure and pain 2. ONSET: "When did the sinus pain start?"  (e.g., hours, days)      1 week for headache and sore throat 3. SEVERITY: "How bad is the pain?"   (Scale 1-10; mild, moderate or severe)   - MILD (1-3): doesn't interfere with normal activities    - MODERATE (4-7): interferes with normal activities (e.g., work or school) or awakens from sleep   - SEVERE (8-10): excruciating pain and patient unable to do any normal activities        4-7 4. RECURRENT SYMPTOM: "Have you ever had sinus problems before?" If so, ask: "When was the last time?" and "What happened that time?"     Yes has allergies 5. NASAL CONGESTION: "Is the nose blocked?" If so, ask, "Can you open it or must you breathe through the mouth?"     both 6. NASAL DISCHARGE: "Do you have discharge from your nose?" If so ask, "What color?"     clear 7. FEVER: "Do you have a fever?" If so, ask: "What is it, how was it measured, and when did it start?"     Tuesday low grade  Today 100 8. OTHER SYMPTOMS: "Do you have any other symptoms?" (e.g., sore throat, cough, earache, difficulty breathing)    Sore throat and Headache slight cough 9. PREGNANCY: "Is there any chance you are pregnant?" "When was your last menstrual period?"     No medication no menses  Protocols used: SINUS PAIN OR CONGESTION-A-AH

## 2019-04-23 MED ORDER — AZITHROMYCIN 500 MG PO TABS: ORAL_TABLET | ORAL | 0 refills | Status: DC

## 2019-04-23 NOTE — Addendum Note (Signed)
Addended by: Steele Sizer F on: 04/23/2019 02:20 PM   Modules accepted: Orders

## 2019-04-23 NOTE — Telephone Encounter (Signed)
Patient was notified and will pick up antibiotic.

## 2019-04-23 NOTE — Telephone Encounter (Signed)
Please advise as to what we should do. Everyone is full today.

## 2019-04-23 NOTE — Telephone Encounter (Signed)
Copied from [place] [id number]. 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 Patient also needs a out of work letter till this Saturday due to patient is still running fever at 100.00 and has sore throat and would like medications.   Patient would like letter sent to [facility].   Patient call back # [phone]

## 2019-04-23 NOTE — Telephone Encounter (Signed)
Copied from [place] [id number]. Topic: General - Inquiry >> Apr 22, 2019  1:35 PM [place], [place] D wrote: Reason for CRM: Pt stated although she tested negative for Covid-19 she is still experiencing congestion/sore throat/ and a low grade fever right at 100. She would like to know what to do for these symptoms. Please advise. >> Apr 22, 2019  4:26 PM Celene Kras A wrote: Pt called regarding the symptoms she is still experiencing. Pt states she was told she was supposed to get a call back today. Pt states she is on the verge of losing her job because she cannot get answers. Please advise.

## 2019-06-11 ENCOUNTER — Ambulatory Visit: Payer: Self-pay | Admitting: Family Medicine

## 2019-07-20 ENCOUNTER — Ambulatory Visit: Payer: Commercial Managed Care - PPO | Admitting: Nurse Practitioner

## 2019-07-20 ENCOUNTER — Encounter: Payer: Self-pay | Admitting: Nurse Practitioner

## 2019-07-20 ENCOUNTER — Other Ambulatory Visit: Payer: Self-pay

## 2019-07-20 VITALS — BP 116/66 | HR 98 | Temp 97.3°F | Resp 14 | Ht 67.0 in | Wt 254.9 lb

## 2019-07-20 DIAGNOSIS — G43009 Migraine without aura, not intractable, without status migrainosus: Secondary | ICD-10-CM | POA: Diagnosis not present

## 2019-07-20 DIAGNOSIS — F3131 Bipolar disorder, current episode depressed, mild: Secondary | ICD-10-CM

## 2019-07-20 MED ORDER — KETOROLAC TROMETHAMINE 60 MG/2ML IM SOLN
60.0000 mg | Freq: Once | INTRAMUSCULAR | Status: AC
  Administered 2019-07-20: 60 mg via INTRAMUSCULAR

## 2019-07-20 NOTE — Patient Instructions (Addendum)
- Please call your neurologist to set up a follow-up for migraines.  - Please take 12.5-25mg  of phenergan and 12.5-25mg  of benadryl when you get home.    Migraine Headache A migraine headache is an intense, throbbing pain on one side or both sides of the head. Migraine headaches may also cause other symptoms, such as nausea, vomiting, and sensitivity to light and noise. A migraine headache can last from 4 hours to 3 days. Talk with your doctor about what things may bring on (trigger) your migraine headaches. What are the causes? The exact cause of this condition is not known. However, a migraine may be caused when nerves in the brain become irritated and release chemicals that cause inflammation of blood vessels. This inflammation causes pain. This condition may be triggered or caused by:  Drinking alcohol.  Smoking.  Taking medicines, such as: ? Medicine used to treat chest pain (nitroglycerin). ? Birth control pills. ? Estrogen. ? Certain blood pressure medicines.  Eating or drinking products that contain nitrates, glutamate, aspartame, or tyramine. Aged cheeses, chocolate, or caffeine may also be triggers.  Doing physical activity. Other things that may trigger a migraine headache include:  Menstruation.  Pregnancy.  Hunger.  Stress.  Lack of sleep or too much sleep.  Weather changes.  Fatigue. What increases the risk? The following factors may make you more likely to experience migraine headaches:  Being a certain age. This condition is more common in people who are 35-9 years old.  Being female.  Having a family history of migraine headaches.  Being Caucasian.  Having a mental health condition, such as depression or anxiety.  Being obese. What are the signs or symptoms? The main symptom of this condition is pulsating or throbbing pain. This pain may:  Happen in any area of the head, such as on one side or both sides.  Interfere with daily  activities.  Get worse with physical activity.  Get worse with exposure to bright lights or loud noises. Other symptoms may include:  Nausea.  Vomiting.  Dizziness.  General sensitivity to bright lights, loud noises, or smells. Before you get a migraine headache, you may get warning signs (an aura). An aura may include:  Seeing flashing lights or having blind spots.  Seeing bright spots, halos, or zigzag lines.  Having tunnel vision or blurred vision.  Having numbness or a tingling feeling.  Having trouble talking.  Having muscle weakness. Some people have symptoms after a migraine headache (postdromal phase), such as:  Feeling tired.  Difficulty concentrating. How is this diagnosed? A migraine headache can be diagnosed based on:  Your symptoms.  A physical exam.  Tests, such as: ? CT scan or an MRI of the head. These imaging tests can help rule out other causes of headaches. ? Taking fluid from the spine (lumbar puncture) and analyzing it (cerebrospinal fluid analysis, or CSF analysis). How is this treated? This condition may be treated with medicines that:  Relieve pain.  Relieve nausea.  Prevent migraine headaches. Treatment for this condition may also include:  Acupuncture.  Lifestyle changes like avoiding foods that trigger migraine headaches.  Biofeedback.  Cognitive behavioral therapy. Follow these instructions at home: Medicines  Take over-the-counter and prescription medicines only as told by your health care provider.  Ask your health care provider if the medicine prescribed to you: ? Requires you to avoid driving or using heavy machinery. ? Can cause constipation. You may need to take these actions to prevent or treat constipation:  Drink enough fluid to keep your urine pale yellow.  Take over-the-counter or prescription medicines.  Eat foods that are high in fiber, such as beans, whole grains, and fresh fruits and  vegetables.  Limit foods that are high in fat and processed sugars, such as fried or sweet foods. Lifestyle  Do not drink alcohol.  Do not use any products that contain nicotine or tobacco, such as cigarettes, e-cigarettes, and chewing tobacco. If you need help quitting, ask your health care provider.  Get at least 8 hours of sleep every night.  Find ways to manage stress, such as meditation, deep breathing, or yoga. General instructions    Keep a journal to find out what may trigger your migraine headaches. For example, write down: ? What you eat and drink. ? How much sleep you get. ? Any change to your diet or medicines.  If you have a migraine headache: ? Avoid things that make your symptoms worse, such as bright lights. ? It may help to lie down in a dark, quiet room. ? Do not drive or use heavy machinery. ? Ask your health care provider what activities are safe for you while you are experiencing symptoms.  Keep all follow-up visits as told by your health care provider. This is important. Contact a health care provider if:  You develop symptoms that are different or more severe than your usual migraine headache symptoms.  You have more than 15 headache days in one month. Get help right away if:  Your migraine headache becomes severe.  Your migraine headache lasts longer than 72 hours.  You have a fever.  You have a stiff neck.  You have vision loss.  Your muscles feel weak or like you cannot control them.  You start to lose your balance often.  You have trouble walking.  You faint.  You have a seizure. Summary  A migraine headache is an intense, throbbing pain on one side or both sides of the head. Migraines may also cause other symptoms, such as nausea, vomiting, and sensitivity to light and noise.  This condition may be treated with medicines and lifestyle changes. You may also need to avoid certain things that trigger a migraine headache.  Keep a  journal to find out what may trigger your migraine headaches.  Contact your health care provider if you have more than 15 headache days in a month or you develop symptoms that are different or more severe than your usual migraine headache symptoms. This information is not intended to replace advice given to you by your health care provider. Make sure you discuss any questions you have with your health care provider. Document Released: 11/04/2005 Document Revised: 02/26/2019 Document Reviewed: 12/17/2018 Elsevier Patient Education  2020 [employer].

## 2019-07-20 NOTE — Progress Notes (Signed)
Name: Tracey Perry   MRN: [id number]    DOB: Aug 06, 1991   Date:07/20/2019       Progress Note  Subjective  Chief Complaint  Chief Complaint  Patient presents with  . Migraine    Onset x 4days    HPI  Patient has a history of migraines takes topamax daily for preventative and maxalt as needed for acute symptoms. Symptoms started 4 days ago. States left sided migraine, nausea, light sensitivity, phonosensitivity- nausea has improved some but headache is same. States she gets a severe migraine like this usually 1-2 times a year. Neurologist- at [facility], appointment was cancelled in march due to pandemic.   Bipolar disorder Follows up with Adria Devon- has an appointment on Thursday  She is on lamictal and zoloft. Denies SI and HI   PHQ2/9: Depression screen [facility] 2/9 07/20/2019 04/20/2019 12/04/2018 12/04/2018 11/25/2018  Decreased Interest 3 3 2 2  0  Down, Depressed, Hopeless 3 3 3 3 2   PHQ - 2 Score 6 6 5 5 2   Altered sleeping - 3 2 2 3   Tired, decreased energy - 2 2 2 3   Change in appetite - 3 2 2 1   Feeling bad or failure about yourself  - 3 3 3 2   Trouble concentrating - 3 2 2 3   Moving slowly or fidgety/restless - 3 0 0 1  Suicidal thoughts - 0 3 3 1   PHQ-9 Score - 23 19 19 16   Difficult doing work/chores - Very difficult Extremely dIfficult Extremely dIfficult Extremely dIfficult  Some encounter information is confidential and restricted. Go to Review Flowsheets activity to see all data.     PHQ reviewed. Positive  Patient Active Problem List   Diagnosis Date Noted  . Fever blister 07/29/2016  . Menorrhagia with irregular cycle 06/20/2016  . OSA (obstructive sleep apnea) 05/11/2016  . Hypersomnia 12/25/2015  . Snoring 12/25/2015  . Cluster B personality disorder ([place]) 10/27/2015  . Episodic mood disorder ([place]) 10/27/2015  . H/O urinary tract infection 06/21/2015  . 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 Affective disorder ([place]) 06/21/2015  . Depression, major,  recurrent, moderate ([place]) 06/21/2015  . Gastroparesis 06/21/2015  . Hx of cold sores 06/21/2015  . Bipolar affective disorder ([place]) 05/31/2015  . Generalized anxiety disorder 05/31/2015  . History of pyelonephritis 05/30/2015  . Dysmenorrhea 05/30/2015  . History of asthma 05/30/2015  . Migraine without aura and responsive to treatment 05/30/2015  . Morbid obesity, unspecified obesity type ([place]) 05/30/2015  . Allergic rhinitis, seasonal 05/30/2015  . Vertigo 05/30/2015  . IBS (irritable bowel syndrome) 09/20/2013  . H/O cervical spine surgery 12/29/2012  . Vitamin D deficiency 11/23/2009  . History of mononucleosis 04/03/2009    Past Medical History:  Diagnosis Date  . Allergy   . Anxiety   . Depression   . Gastroparesis   . Herpes simplex without complication   . Morbid obesity due to excess calories ([place])   . PMDD (premenstrual dysphoric disorder)   . Vertigo   . Vitamin D deficiency     Past Surgical History:  Procedure Laterality Date  . ADENOIDECTOMY    . APPENDECTOMY    . CHOLECYSTECTOMY    . LUMBAR DISC SURGERY    . TONSILLECTOMY    . TYMPANOSTOMY TUBE PLACEMENT      Social History   Tobacco Use  . Smoking status: Never Smoker  . Smokeless tobacco: Never Used  Substance Use Topics  . Alcohol use: Not Currently    Alcohol/week:  0.0 standard drinks     Current Outpatient Medications:  .  Acetylcysteine (NAC) 600 MG CAPS, Take 1 capsule by mouth 3 (three) times daily., Disp: , Rfl:  .  cetirizine (ZYRTEC) 10 MG tablet, Take by mouth., Disp: , Rfl:  .  clonazePAM (KLONOPIN) 0.5 MG tablet, Take 1 tablet (0.5 mg total) by mouth 2 (two) times daily., Disp: 60 tablet, Rfl: 4 .  fluticasone (FLONASE) 50 MCG/ACT nasal spray, Place into both nostrils daily., Disp: , Rfl:  .  LamoTRIgine 200 MG TB24 24 hour tablet, Take 1 tablet by mouth 2 (two) times a day. , Disp: , Rfl:  .  MAGNESIUM OXIDE PO, Take 500 mg by mouth daily. , Disp: , Rfl:  .  montelukast  (SINGULAIR) 10 MG tablet, Take 1 tablet (10 mg total) by mouth every evening., Disp: 90 tablet, Rfl: 1 .  norethindrone (AYGESTIN) 5 MG tablet, Take 5 mg by mouth daily., Disp: , Rfl:  .  rizatriptan (MAXALT) 10 MG tablet, Take 1 tablet (10 mg total) by mouth as needed for migraine. May repeat in 2 hours if needed, Disp: 10 tablet, Rfl: 0 .  sertraline (ZOLOFT) 50 MG tablet, Take 75 mg by mouth daily., Disp: , Rfl:  .  topiramate (TOPAMAX) 50 MG tablet, Take by mouth. 1 tablet every morning and 3 tablets at night, Disp: , Rfl: 6 .  valACYclovir (VALTREX) 1000 MG tablet, Take 1 tablet (1,000 mg total) by mouth 2 (two) times daily. (Patient taking differently: Take 1,000 mg by mouth 2 (two) times daily as needed. ), Disp: 30 tablet, Rfl: 1 .  propranolol (INDERAL) 10 MG tablet, Take 10 mg by mouth 2 (two) times daily. , Disp: , Rfl:  .  risperiDONE (RISPERDAL) 1 MG tablet, Take 1 tablet by mouth at bedtime., Disp: , Rfl:  .  VIIBRYD 10 MG TABS, Take 30 mg by mouth daily., Disp: , Rfl: 2  Allergies  Allergen Reactions  . Apple Itching and Swelling    ONLY certain "FRESH FRUITS" cause throat itches & in spring, throat feels like it will swell.  (apples, peaches, pears, plums, mango)  . Nutritional Supplements Itching and Swelling    ONLY certain "FRESH FRUITS" cause throat itches & in spring, throat feels like it will swell.  . Prednisone Other (See Comments) and Palpitations    Severe mood swings and tachycardia  . Amoxicillin-Pot Clavulanate Other (See Comments)    Mood swings Mood swings Mood swings    ROS   No other specific complaints in a complete review of systems (except as listed in HPI above).  Objective  Vitals:   07/20/19 1043  BP: 116/66  Pulse: 98  Resp: 14  Temp: (!) 97.3 F (36.3 C)  SpO2: 98%  Weight: 254 lb 14.4 oz (115.6 kg)  Height: 5\' 7"  (1.702 m)    Body mass index is 39.92 kg/m.  Nursing Note and Vital Signs reviewed.  Physical Exam  Physical  Exam   Constitutional: Patient appears well-developed and well-nourished. Obese. No distress.  HEENT: head atraumatic, normocephalic, conjunctive clear Cardiovascular: Normal rate,  No BLE edema. Pulmonary/Chest: Effort normall. No respiratory distress. Skin: no concerning rashes or bruising  Psychiatric: Patient has a normal mood and affect. behavior is normal. Judgment and thought content normal.   Hands off exam due to pandemic and no acute concerns.     No results found for this or any previous visit (from the past 48 hour(s)).  Assessment & Plan  1. Migraine without aura and without status migrainosus, not intractable Benadryl and phenergan PO at home because she drover herself - ketorolac (TORADOL) injection 60 mg  2. Bipolar affective disorder, currently depressed, mild ([place]) Follow-up with psychiatrist.

## 2019-07-23 ENCOUNTER — Ambulatory Visit: Payer: Commercial Managed Care - PPO | Admitting: Family Medicine

## 2019-08-20 ENCOUNTER — Ambulatory Visit: Payer: Commercial Managed Care - PPO | Admitting: Family Medicine

## 2019-08-27 ENCOUNTER — Encounter: Payer: Self-pay | Admitting: Family Medicine

## 2019-08-27 ENCOUNTER — Ambulatory Visit: Payer: Commercial Managed Care - PPO | Admitting: Family Medicine

## 2019-08-27 ENCOUNTER — Other Ambulatory Visit: Payer: Self-pay

## 2019-08-27 VITALS — BP 109/68 | HR 95 | Temp 97.3°F | Resp 16 | Ht 65.5 in | Wt 260.7 lb

## 2019-08-27 DIAGNOSIS — G4733 Obstructive sleep apnea (adult) (pediatric): Secondary | ICD-10-CM

## 2019-08-27 DIAGNOSIS — L748 Other eccrine sweat disorders: Secondary | ICD-10-CM | POA: Diagnosis not present

## 2019-08-27 DIAGNOSIS — Z23 Encounter for immunization: Secondary | ICD-10-CM | POA: Diagnosis not present

## 2019-08-27 DIAGNOSIS — F3131 Bipolar disorder, current episode depressed, mild: Secondary | ICD-10-CM

## 2019-08-27 DIAGNOSIS — G43009 Migraine without aura, not intractable, without status migrainosus: Secondary | ICD-10-CM

## 2019-08-27 DIAGNOSIS — J4520 Mild intermittent asthma, uncomplicated: Secondary | ICD-10-CM

## 2019-08-27 DIAGNOSIS — L750 Bromhidrosis: Secondary | ICD-10-CM

## 2019-08-27 MED ORDER — CLINDAMYCIN PHOSPHATE 1 % EX GEL: Freq: Two times a day (BID) | CUTANEOUS | 0 refills | Status: DC

## 2019-08-27 MED ORDER — MONTELUKAST SODIUM 10 MG PO TABS: 10.0000 mg | ORAL_TABLET | Freq: Every evening | ORAL | 1 refills | Status: DC

## 2019-08-27 MED ORDER — ALBUTEROL SULFATE HFA 108 (90 BASE) MCG/ACT IN AERS: 2.0000 | INHALATION_SPRAY | Freq: Four times a day (QID) | RESPIRATORY_TRACT | 0 refills | Status: DC | PRN

## 2019-08-27 NOTE — Progress Notes (Signed)
Name: Tracey Perry   MRN: [id number]    DOB: 1991/01/28   Date:08/27/2019       Progress Note  Subjective  Chief Complaint  Chief Complaint  Patient presents with  . Medication Refill  . Asthma  . Allergic Rhinitis   . Back Pain  . Migraine    Seeing Neurologist   . Morbid Obesity    HPI  Back pain Intermittent: long history of low back pain, for the past 5 or 6 years when her bra size went up. However last summer it got work and was radiating from lower back to mid thoracic area. The pain was constant, like a tightness. She has seen neurologist for a migraine that was present for about one month and for the past week she has been on high dose gabapentin 1200 mg per day. Feeling well now   Bipolar Disorder: currently seeing psychiatrist Dr. Karle Barr  in [place], she had a hospitalization in January for suicidal thoughts ( 3rd admission ), she has been stable since. She is compliant with medication and also therapy.   Morbid obesity: her weight goes up and downs, in 2017 she was 276 lbs, went down to 229 lbs but is up again, she states she can drop weight when she wants to.   Migraine: she states she had a constant headache for about one month, she is doing well on high dose of gabapentin, she is on inderal but it is given by psychiatrist for anxiety and essential tremors.   Asthma: she is doing well, no cough, sob or wheezing. She had a slight flare a couple of weeks ago but was just one day   OSA: insurance did not cover CPAP at the time, no change in insurance.     Patient Active Problem List   Diagnosis Date Noted  . Fever blister 07/29/2016  . Menorrhagia with irregular cycle 06/20/2016  . OSA (obstructive sleep apnea) 05/11/2016  . Hypersomnia 12/25/2015  . Snoring 12/25/2015  . Cluster B personality disorder ([place]) 10/27/2015  . Episodic mood disorder ([place]) 10/27/2015  . H/O urinary tract infection 06/21/2015  . 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  Affective disorder ([place]) 06/21/2015  . Depression, major, recurrent, moderate ([place]) 06/21/2015  . Gastroparesis 06/21/2015  . Hx of cold sores 06/21/2015  . Bipolar affective disorder ([place]) 05/31/2015  . Generalized anxiety disorder 05/31/2015  . History of pyelonephritis 05/30/2015  . Dysmenorrhea 05/30/2015  . History of asthma 05/30/2015  . Migraine without aura and responsive to treatment 05/30/2015  . Morbid obesity, unspecified obesity type ([place]) 05/30/2015  . Allergic rhinitis, seasonal 05/30/2015  . Vertigo 05/30/2015  . IBS (irritable bowel syndrome) 09/20/2013  . H/O cervical spine surgery 12/29/2012  . Vitamin D deficiency 11/23/2009  . History of mononucleosis 04/03/2009    Past Surgical History:  Procedure Laterality Date  . ADENOIDECTOMY    . APPENDECTOMY    . CHOLECYSTECTOMY    . LUMBAR DISC SURGERY    . TONSILLECTOMY    . TYMPANOSTOMY TUBE PLACEMENT      Family History  Problem Relation Age of Onset  . Depression Mother   . Hypertension Mother        controlled  . Allergic rhinitis Mother   . Anxiety disorder Mother   . Diabetes Father        controlled  . Hypertension Father   . Allergic rhinitis Father   . Depression Father   . Depression Brother   . Anxiety  disorder Brother     Social History   Socioeconomic History  . Marital status: Single    Spouse name: Not on file  . Number of children: 0  . Years of education: Not on file  . Highest education level: Master's degree (e.g., MA, MS, MEng, MEd, MSW, MBA)  Occupational History  . Occupation: retail     Comment: she is applying for a position in a [place]  . Financial resource strain: Not hard at all  . Food insecurity    Worry: Never true    Inability: Never true  . Transportation needs    Medical: No    Non-medical: No  Tobacco Use  . Smoking status: Never Smoker  . Smokeless tobacco: Never Used  Substance and Sexual Activity  . Alcohol use: Not Currently     Alcohol/week: 0.0 standard drinks  . Drug use: No  . Sexual activity: Never    Birth control/protection: I.U.D.  Lifestyle  . Physical activity    Days per week: 0 days    Minutes per session: 0 min  . Stress: Not at all  Relationships  . Social connections    Talks on phone: More than three times a week    Gets together: Once a week    Attends religious service: Never    Active member of club or organization: No    Attends meetings of clubs or organizations: Never    Relationship status: Never married  . Intimate partner violence    Fear of current or ex partner: No    Emotionally abused: No    Physically abused: No    Forced sexual activity: No  Other Topics Concern  . 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     Current Outpatient Medications:  .  Acetylcysteine (NAC) 600 MG CAPS, Take 1 capsule by mouth 3 (three) times daily., Disp: , Rfl:  .  cetirizine (ZYRTEC) 10 MG tablet, Take by mouth., Disp: , Rfl:  .  clonazePAM (KLONOPIN) 0.5 MG tablet, Take 1 tablet (0.5 mg total) by mouth 2 (two) times daily., Disp: 60 tablet, Rfl: 4 .  fluticasone (FLONASE) 50 MCG/ACT nasal spray, Place into both nostrils daily., Disp: , Rfl:  .  gabapentin (NEURONTIN) 300 MG capsule, Take 1 capsule (300 mg) nightly for 3 days. Then take 1 capsule (300 mg) in the morning and 1 capsule (300 mg) at night for 4 days. Then continue taking 1 capsule (300 mg) in the morning, 1 capsule (300 mg) in the afternoon and 2 capsules (600 mg) in the evening., Disp: , Rfl:  .  LamoTRIgine 200 MG TB24 24 hour tablet, Take 1 tablet by mouth 2 (two) times a day., Disp: , Rfl:  .  montelukast (SINGULAIR) 10 MG tablet, Take 1 tablet (10 mg total) by mouth every evening., Disp: 90 tablet, Rfl: 1 .  norethindrone (AYGESTIN) 5 MG tablet, Take 5 mg by mouth daily., Disp: , Rfl:  .  propranolol (INDERAL) 10 MG tablet, Take 10 mg by  mouth 2 (two) times daily., Disp: , Rfl:  .  rizatriptan (MAXALT) 10 MG tablet, Take 1 tablet (10 mg total) by mouth as needed for migraine. May repeat in 2 hours if needed, Disp: 10 tablet, Rfl: 0 .  sertraline (ZOLOFT) 100 MG tablet, Take 100 mg by mouth daily., Disp: , Rfl:  .  topiramate (TOPAMAX) 50 MG tablet, Take by mouth. 1 tablet every morning and 3 tablets at night, Disp: , Rfl: 6 .  valACYclovir (VALTREX) 1000 MG tablet, Take 1 tablet (1,000 mg total) by mouth 2 (two) times daily. (Patient taking differently: Take 1,000 mg by mouth 2 (two) times daily as needed. ), Disp: 30 tablet, Rfl: 1 .  clindamycin (CLINDAGEL) 1 % gel, Apply topically 2 (two) times daily., Disp: 60 g, Rfl: 0  Allergies  Allergen Reactions  . Apple Itching and Swelling    ONLY certain "FRESH FRUITS" cause throat itches & in spring, throat feels like it will swell.  (apples, peaches, pears, plums, mango)  . Nutritional Supplements Itching and Swelling    ONLY certain "FRESH FRUITS" cause throat itches & in spring, throat feels like it will swell.  . Prednisone Other (See Comments) and Palpitations    Severe mood swings and tachycardia  . Amoxicillin-Pot Clavulanate Other (See Comments)    Mood swings Mood swings Mood swings    I personally reviewed active problem list, medication list, allergies, family history, social history, health maintenance with the patient/caregiver today.   ROS  Constitutional: Negative for fever or weight change.  Respiratory: Negative for cough and shortness of breath.   Cardiovascular: Negative for chest pain or palpitations.  Gastrointestinal: Negative for abdominal pain, no bowel changes.  Musculoskeletal: Negative for gait problem or joint swelling.  Skin: Negative for rash.  Neurological: Negative for dizziness or headache.  No other specific complaints in a complete review of systems (except as listed in HPI above).  Objective  Vitals:   08/27/19 1038  BP: 109/68   Pulse: 95  Resp: 16  Temp: (!) 97.3 F (36.3 C)  TempSrc: Temporal  SpO2: 99%  Weight: 260 lb 11.2 oz (118.3 kg)  Height: 5' 5.5" (1.664 m)    Body mass index is 42.72 kg/m.  Physical Exam  Constitutional: Patient appears well-developed and well-nourished. Obese  No distress.  HEENT: head atraumatic, normocephalic, pupils equal and reactive to light Cardiovascular: Normal rate, regular rhythm and normal heart sounds.  No murmur heard. No BLE edema. Pulmonary/Chest: Effort normal and breath sounds normal. No respiratory distress. Abdominal: Soft.  There is no tenderness. Psychiatric: Patient has a normal mood and affect. behavior is normal. Judgment and thought content normal.  PHQ2/9: Depression screen [facility] 2/9 08/27/2019 07/20/2019 04/20/2019 12/04/2018 12/04/2018  Decreased Interest 2 3 3 2 2   Down, Depressed, Hopeless 2 3 3 3 3   PHQ - 2 Score 4 6 6 5 5   Altered sleeping 3 - 3 2 2   Tired, decreased energy 2 - 2 2 2   Change in appetite 3 - 3 2 2   Feeling bad or failure about yourself  2 - 3 3 3   Trouble concentrating 3 - 3 2 2   Moving slowly or fidgety/restless 2 - 3 0 0  Suicidal thoughts 1 - 0 3 3  PHQ-9 Score 20 - 23 19 19   Difficult doing work/chores Very difficult - Very difficult Extremely dIfficult Extremely dIfficult  Some encounter information is confidential and restricted. Go to Review Flowsheets activity to see all data.  Some recent data might be hidden    phq 9 is positive  Fall Risk: Fall Risk  08/27/2019 07/20/2019 04/20/2019 12/04/2018 11/25/2018  Falls in the past year? 1 1 0 0 0  Number falls in past yr: 1 1 0 0 0  Injury with Fall? 0 0 0 0 0    Functional Status  Survey: Is the patient deaf or have difficulty hearing?: No Does the patient have difficulty seeing, even when wearing glasses/contacts?: Yes Does the patient have difficulty concentrating, remembering, or making decisions?: No Does the patient have difficulty walking or climbing stairs?: No Does  the patient have difficulty dressing or bathing?: No Does the patient have difficulty doing errands alone such as visiting a doctor's office or shopping?: No    Assessment & Plan  1. Mild intermittent asthma without complication  - montelukast (SINGULAIR) 10 MG tablet; Take 1 tablet (10 mg total) by mouth every evening.  Dispense: 90 tablet; Refill: 1  2. Body odor  - clindamycin (CLINDAGEL) 1 % gel; Apply topically 2 (two) times daily.  Dispense: 60 g; Refill: 0  3. Need for immunization against influenza  - Flu Vaccine QUAD 36+ mos IM  4. Migraine without aura and responsive to treatment  Keep follow up with neurologist   5. Bipolar affective disorder, currently depressed, mild ([place])  Keep follow up with psychiatrist , doing well now   6. Morbid obesity, unspecified obesity type [facility])  Discussed with the patient the risk posed by an increased BMI. Discussed importance of portion control, calorie counting and at least 150 minutes of physical activity weekly. Avoid sweet beverages and drink more water. Eat at least 6 servings of fruit and vegetables daily   7. OSA (obstructive sleep apnea)  Insurance

## 2019-09-19 ENCOUNTER — Other Ambulatory Visit: Payer: Self-pay | Admitting: Family Medicine

## 2019-09-24 ENCOUNTER — Other Ambulatory Visit: Payer: Self-pay | Admitting: Family Medicine

## 2019-09-24 DIAGNOSIS — L750 Bromhidrosis: Secondary | ICD-10-CM

## 2019-09-24 DIAGNOSIS — L748 Other eccrine sweat disorders: Secondary | ICD-10-CM

## 2019-12-02 ENCOUNTER — Ambulatory Visit: Payer: Commercial Managed Care - PPO | Admitting: Family Medicine

## 2019-12-02 ENCOUNTER — Encounter: Payer: Self-pay | Admitting: Family Medicine

## 2019-12-02 ENCOUNTER — Other Ambulatory Visit: Payer: Self-pay

## 2019-12-02 VITALS — BP 112/78 | HR 89 | Temp 97.3°F | Resp 14 | Ht 65.0 in | Wt 267.4 lb

## 2019-12-02 DIAGNOSIS — M546 Pain in thoracic spine: Secondary | ICD-10-CM | POA: Diagnosis not present

## 2019-12-02 DIAGNOSIS — M542 Cervicalgia: Secondary | ICD-10-CM

## 2019-12-02 DIAGNOSIS — M5030 Other cervical disc degeneration, unspecified cervical region: Secondary | ICD-10-CM

## 2019-12-02 DIAGNOSIS — Z9889 Other specified postprocedural states: Secondary | ICD-10-CM | POA: Diagnosis not present

## 2019-12-02 MED ORDER — TIZANIDINE HCL 2 MG PO CAPS: 2.0000 mg | ORAL_CAPSULE | Freq: Three times a day (TID) | ORAL | 2 refills | Status: DC

## 2019-12-02 MED ORDER — NAPROXEN 500 MG PO TABS: 500.0000 mg | ORAL_TABLET | Freq: Two times a day (BID) | ORAL | 0 refills | Status: DC

## 2019-12-02 NOTE — Progress Notes (Signed)
Patient ID: Tracey Perry, female    DOB: [birth year], 29 y.o.   MRN: [id number]  PCP: Steele Sizer, MD  Chief Complaint  Patient presents with  . Neck Pain    started before Christmas  . Back Pain    shoulder pain on occasion    Subjective:   Tracey Perry is a 29 y.o. female, presents to clinic with CC of the following:  HPI   Upper thoracic back pain that radiates to b/l shoulder blades, pain is daily, came on suddenly w/o any injury or strain, no change to job  Or at home, it occurs daily, feels like pulling and tightness, sometimes sharp, throughout the day it get worse and radiates up to b/l trapezius at the end of days she works, on days she doesn't work it stay fairly constant to thoracic back   Heat makes it worse, she tried muscle relaxers no change or improvement. Ibuprofen helps minimally with pain for short periods of time.  Pain on average 5/10, unchanged severity since onset  On gabapentin 300 mg am and 900 mg for migraines -   Cervical spinal surgery 7 years ago c6-c7 discectomy and fusion She believes she saw surgeon at [place] [facility]  She requests [facility] referrals for specialists  Neck surgery 1st through 3rd fingers - permanent decreased sensation, no other new numbness or weakness   Patient Active Problem List   Diagnosis Date Noted  . Fever blister 07/29/2016  . Menorrhagia with irregular cycle 06/20/2016  . OSA (obstructive sleep apnea) 05/11/2016  . Hypersomnia 12/25/2015  . Snoring 12/25/2015  . Cluster B personality disorder ([place]) 10/27/2015  . Episodic mood disorder ([place]) 10/27/2015  . H/O urinary tract infection 06/21/2015  . 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 Affective disorder ([place]) 06/21/2015  . Depression, major, recurrent, moderate ([place]) 06/21/2015  . Gastroparesis 06/21/2015  . Hx of cold sores 06/21/2015  . Bipolar affective disorder ([place]) 05/31/2015  . Generalized anxiety disorder 05/31/2015  .  History of pyelonephritis 05/30/2015  . Dysmenorrhea 05/30/2015  . History of asthma 05/30/2015  . Migraine without aura and responsive to treatment 05/30/2015  . Morbid obesity, unspecified obesity type ([place]) 05/30/2015  . Allergic rhinitis, seasonal 05/30/2015  . Vertigo 05/30/2015  . IBS (irritable bowel syndrome) 09/20/2013  . H/O cervical spine surgery 12/29/2012  . Vitamin D deficiency 11/23/2009  . History of mononucleosis 04/03/2009      Current Outpatient Medications:  .  Acetylcysteine (NAC) 600 MG CAPS, Take 1 capsule by mouth 3 (three) times daily., Disp: , Rfl:  .  cetirizine (ZYRTEC) 10 MG tablet, Take by mouth., Disp: , Rfl:  .  clindamycin (CLINDAGEL) 1 % gel, APPLY TO AFFECTED AREA TWICE A DAY, Disp: 60 g, Rfl: 0 .  clonazePAM (KLONOPIN) 0.5 MG tablet, Take 1 tablet (0.5 mg total) by mouth 2 (two) times daily., Disp: 60 tablet, Rfl: 4 .  fluticasone (FLONASE) 50 MCG/ACT nasal spray, Place into both nostrils daily., Disp: , Rfl:  .  gabapentin (NEURONTIN) 300 MG capsule, Take 1 capsule (300 mg) nightly for 3 days. Then take 1 capsule (300 mg) in the morning and 1 capsule (300 mg) at night for 4 days. Then continue taking 1 capsule (300 mg) in the morning, 1 capsule (300 mg) in the afternoon and 2 capsules (600 mg) in the evening., Disp: , Rfl:  .  LamoTRIgine 200 MG TB24 24 hour tablet, Take 1 tablet by mouth 2 (two) times a  day., Disp: , Rfl:  .  montelukast (SINGULAIR) 10 MG tablet, Take 1 tablet (10 mg total) by mouth every evening., Disp: 90 tablet, Rfl: 1 .  norethindrone (AYGESTIN) 5 MG tablet, Take 5 mg by mouth daily., Disp: , Rfl:  .  propranolol (INDERAL) 10 MG tablet, Take 10 mg by mouth 2 (two) times daily., Disp: , Rfl:  .  rizatriptan (MAXALT) 10 MG tablet, Take 1 tablet (10 mg total) by mouth as needed for migraine. May repeat in 2 hours if needed, Disp: 10 tablet, Rfl: 0 .  sertraline (ZOLOFT) 100 MG tablet, Take 100 mg by mouth daily., Disp: , Rfl:  .   topiramate (TOPAMAX) 50 MG tablet, Take by mouth. 1 tablet every morning and 3 tablets at night, Disp: , Rfl: 6 .  valACYclovir (VALTREX) 1000 MG tablet, Take 1 tablet (1,000 mg total) by mouth 2 (two) times daily. (Patient taking differently: Take 1,000 mg by mouth 2 (two) times daily as needed. ), Disp: 30 tablet, Rfl: 1 .  VENTOLIN HFA 108 (90 Base) MCG/ACT inhaler, TAKE 2 PUFFS BY MOUTH EVERY 6 HOURS AS NEEDED FOR WHEEZE OR SHORTNESS OF BREATH, Disp: 18 g, Rfl: 0   Allergies  Allergen Reactions  . Apple Itching and Swelling    ONLY certain "FRESH FRUITS" cause throat itches & in spring, throat feels like it will swell.  (apples, peaches, pears, plums, mango)  . Nutritional Supplements Itching and Swelling    ONLY certain "FRESH FRUITS" cause throat itches & in spring, throat feels like it will swell.  . Prednisone Other (See Comments) and Palpitations    Severe mood swings and tachycardia  . Amoxicillin-Pot Clavulanate Other (See Comments)    Mood swings Mood swings Mood swings     Family History  Problem Relation Age of Onset  . Depression Mother   . Hypertension Mother        controlled  . Allergic rhinitis Mother   . Anxiety disorder Mother   . Diabetes Father        controlled  . Hypertension Father   . Allergic rhinitis Father   . Depression Father   . Depression Brother   . Anxiety disorder Brother      Social History   Socioeconomic History  . Marital status: Single    Spouse name: Not on file  . Number of children: 0  . Years of education: Not on file  . Highest education level: Master's degree (e.g., MA, MS, MEng, MEd, MSW, MBA)  Occupational History  . Occupation: retail     Comment: she is applying for a position in a [place] Use  . Smoking status: Never Smoker  . Smokeless tobacco: Never Used  Substance and Sexual Activity  . Alcohol use: Not Currently    Alcohol/week: 0.0 standard drinks  . Drug use: No  . Sexual activity: Never     Birth control/protection: I.U.D.  Other Topics Concern  . 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   Social Determinants of Health   Financial Resource Strain:   . Difficulty of Paying Living Expenses: Not on file  Food Insecurity:   . Worried About Charity fundraiser in the Last Year: Not on file  . Ran Out of Food in the Last Year: Not on file  Transportation Needs:   . Lack of Transportation (Medical): Not  on file  . Lack of Transportation (Non-Medical): Not on file  Physical Activity:   . Days of Exercise per Week: Not on file  . Minutes of Exercise per Session: Not on file  Stress:   . Feeling of Stress : Not on file  Social Connections:   . Frequency of Communication with Friends and Family: Not on file  . Frequency of Social Gatherings with Friends and Family: Not on file  . Attends Religious Services: Not on file  . Active Member of Clubs or Organizations: Not on file  . Attends Archivist Meetings: Not on file  . Marital Status: Not on file  Intimate Partner Violence:   . Fear of Current or Ex-Partner: Not on file  . Emotionally Abused: Not on file  . Physically Abused: Not on file  . Sexually Abused: Not on file    Chart Review Today: I personally reviewed active problem list, medication list, allergies, family history, social history, health maintenance, notes from last encounter, lab results, imaging with the patient/caregiver today.   Review of Systems  Constitutional: Negative.   HENT: Negative.   Eyes: Negative.   Respiratory: Negative.   Cardiovascular: Negative.   Gastrointestinal: Negative.   Endocrine: Negative.   Genitourinary: Negative.   Musculoskeletal: Negative.   Skin: Negative.   Allergic/Immunologic: Negative.   Neurological: Negative.   Hematological: Negative.   Psychiatric/Behavioral: Negative.   All other systems reviewed and are  negative.      Objective:   Vitals:   12/02/19 1058  BP: 112/78  Pulse: 89  Resp: 14  Temp: (!) 97.3 F (36.3 C)  TempSrc: Temporal  SpO2: 98%  Weight: 267 lb 6.4 oz (121.3 kg)  Height: 5\' 5"  (1.651 m)    Body mass index is 44.5 kg/m.  Physical Exam Vitals and nursing note reviewed.  Constitutional:      General: She is not in acute distress.    Appearance: Normal appearance. She is well-developed. She is not ill-appearing, toxic-appearing or diaphoretic.     Interventions: Face mask in place.  HENT:     Head: Normocephalic and atraumatic.     Right Ear: External ear normal.     Left Ear: External ear normal.  Eyes:     General: Lids are normal. No scleral icterus.       Right eye: No discharge.        Left eye: No discharge.     Conjunctiva/sclera: Conjunctivae normal.  Neck:     Trachea: Phonation normal. No tracheal deviation.     Comments: Good rotation bilaterally, decreased flexion, normal extension of neck, no midline ttp, generalized paraspinal cervical and upper thoracic ttp extending into upper trapezius b/l 5/5 grip strength bilaterally  Cardiovascular:     Rate and Rhythm: Normal rate and regular rhythm.     Pulses: Normal pulses.          Radial pulses are 2+ on the right side and 2+ on the left side.       Posterior tibial pulses are 2+ on the right side and 2+ on the left side.     Heart sounds: Normal heart sounds. No murmur. No friction rub. No gallop.   Pulmonary:     Effort: Pulmonary effort is normal. No respiratory distress.     Breath sounds: Normal breath sounds. No stridor. No wheezing, rhonchi or rales.  Chest:     Chest wall: No tenderness.  Abdominal:     General: Bowel sounds  are normal. There is no distension.     Palpations: Abdomen is soft.     Tenderness: There is no abdominal tenderness. There is no guarding or rebound.  Musculoskeletal:        General: No deformity. Normal range of motion.     Cervical back: Normal range of  motion and neck supple.     Right lower leg: No edema.     Left lower leg: No edema.  Lymphadenopathy:     Cervical: No cervical adenopathy.  Skin:    General: Skin is warm and dry.     Capillary Refill: Capillary refill takes less than 2 seconds.     Coloration: Skin is not jaundiced or pale.     Findings: No rash.  Neurological:     Mental Status: She is alert and oriented to person, place, and time.     Motor: No abnormal muscle tone.     Gait: Gait normal.  Psychiatric:        Speech: Speech normal.        Behavior: Behavior normal.      Results for orders placed or performed in visit on 04/20/19  Novel Coronavirus, NAA ([facility])  Result Value Ref Range   SARS-CoV-2, NAA Not Detected Not Detected        Assessment & Plan:      ICD-10-CM   1. Cervical spine pain  M54.2 DG Cervical Spine Complete    Ambulatory referral to Neurosurgery    naproxen (NAPROSYN) 500 MG tablet    tizanidine (ZANAFLEX) 2 MG capsule   neck to shoulder and upper back, acute on chronic, a lot of tension, no real radiculopathy, but burning pain to neck and back/shoulder  2. Acute bilateral thoracic back pain  M54.6 DG Thoracic Spine W/Swimmers    Ambulatory referral to Neurosurgery    naproxen (NAPROSYN) 500 MG tablet    tizanidine (ZANAFLEX) 2 MG capsule  3. DDD (degenerative disc disease), cervical  M50.30 DG Cervical Spine Complete    Ambulatory referral to Neurosurgery    tizanidine (ZANAFLEX) 2 MG capsule  4. H/O cervical spine surgery  Z98.890 DG Cervical Spine Complete    Ambulatory referral to Neurosurgery    tizanidine (ZANAFLEX) 2 MG capsule    Screening xrays?  Ordered prior to pt telling me she had surgery - I would prefer she do PT and f/up with specialists For now try heat therapy, muscle relaxers, naproxen Referred to [facility] specialists - hopefully she can get seen there with care program  Encouraged her to do PT, gently stretch or find way to avoid some of the stressors and/or  activity that are aggravating her neck more on work days.    Possible decadron shot for inflammation?   Delsa Grana, PA-C 12/02/19 11:28 AM

## 2019-12-05 ENCOUNTER — Encounter: Payer: Self-pay | Admitting: Family Medicine

## 2019-12-10 ENCOUNTER — Ambulatory Visit (INDEPENDENT_AMBULATORY_CARE_PROVIDER_SITE_OTHER): Payer: Commercial Managed Care - PPO | Admitting: Family Medicine

## 2019-12-10 ENCOUNTER — Encounter: Payer: Self-pay | Admitting: Family Medicine

## 2019-12-10 ENCOUNTER — Other Ambulatory Visit: Payer: Self-pay

## 2019-12-10 VITALS — BP 96/70 | HR 78 | Temp 97.1°F | Resp 16 | Ht 65.5 in | Wt 268.2 lb

## 2019-12-10 DIAGNOSIS — F3131 Bipolar disorder, current episode depressed, mild: Secondary | ICD-10-CM

## 2019-12-10 DIAGNOSIS — M5030 Other cervical disc degeneration, unspecified cervical region: Secondary | ICD-10-CM

## 2019-12-10 DIAGNOSIS — J4520 Mild intermittent asthma, uncomplicated: Secondary | ICD-10-CM

## 2019-12-10 DIAGNOSIS — Z79899 Other long term (current) drug therapy: Secondary | ICD-10-CM

## 2019-12-10 DIAGNOSIS — Z13220 Encounter for screening for lipoid disorders: Secondary | ICD-10-CM

## 2019-12-10 DIAGNOSIS — N6322 Unspecified lump in the left breast, upper inner quadrant: Secondary | ICD-10-CM

## 2019-12-10 DIAGNOSIS — Z0000 Encounter for general adult medical examination without abnormal findings: Secondary | ICD-10-CM | POA: Diagnosis not present

## 2019-12-10 DIAGNOSIS — Z1159 Encounter for screening for other viral diseases: Secondary | ICD-10-CM

## 2019-12-10 DIAGNOSIS — Z131 Encounter for screening for diabetes mellitus: Secondary | ICD-10-CM

## 2019-12-10 MED ORDER — MONTELUKAST SODIUM 10 MG PO TABS: 10.0000 mg | ORAL_TABLET | Freq: Every evening | ORAL | 1 refills | Status: DC

## 2019-12-10 NOTE — Progress Notes (Addendum)
Name: Tracey Perry   MRN: [id number]    DOB: 1991-07-03   Date:12/10/2019       Progress Note  Subjective  Chief Complaint  Chief Complaint  Patient presents with  . Annual Exam    HPI  Patient presents for annual CPE and follow up  Cervical pain: history of DDD and surgery done in 2014 for treatment of herniated disc and still has sequela of numbness on radial aspect of left arm, however since neck pain recurrence Dec 2020 she has noticed numbness on 5th finger, pain is constant on her neck and radiates to thoracic spine, described as bruised and aching sensation and on her arm is described as tingling and burning. She is not sure if weak on left side. Aggravated by rom of her neck, she has been able to sleep with muscle relaxer, she states waiting for evaluation by neurosurgeon at [facility]  Asthma mild intermittent: no problems at this time, no cough, wheezing or sob but needs refill of singulair today  Bipolar 2 : she states she felt back two weeks ago but talking therapist, taking medications and seems to be doing better today. Phq 9 is very high, she states this time of the year is very bad for her. She states she is worried about going to the hospital because of COVID-19, she wants to protect the medical providers - explained that she needs to care for herself, but she states her boss is threading to fire her if she takes more time off  Diet: baking more and gained weight through the pandemic  Exercise: discussed going for daily walks to help her physical and emotional health  USPSTF grade A and B recommendations    Office Visit from 12/10/2019 in [facility]  AUDIT-C Score  0     Depression: Phq 9 is  positive Depression screen [facility] 2/9 12/10/2019 12/10/2019 12/02/2019 08/27/2019 07/20/2019  Decreased Interest 2 0 _0 Down, Depressed, Hopeless 1 0 _1 PHQ - 2 Score 3 0 _2 Altered sleeping 3 0 3 3 -  Tired, decreased energy 3 0 2 2 -  Change in  appetite 3 0 3 3 -  Feeling bad or failure about yourself  1 0 1 2 -  Trouble concentrating 3 0 3 3 -  Moving slowly or fidgety/restless 1 0 1 2 -  Suicidal thoughts 2 0 2 1 -  PHQ-9 Score 19 0 20 20 -  Difficult doing work/chores Very difficult - Very difficult Very difficult -  Some recent data might be hidden   Hypertension: BP Readings from Last 3 Encounters:  12/10/19 96/70  12/02/19 112/78  08/27/19 109/68   Obesity: Wt Readings from Last 3 Encounters:  12/10/19 268 lb 3.2 oz (121.7 kg)  12/02/19 267 lb 6.4 oz (121.3 kg)  08/27/19 260 lb 11.2 oz (118.3 kg)   BMI Readings from Last 3 Encounters:  12/10/19 43.95 kg/m  12/02/19 44.50 kg/m  08/27/19 42.72 kg/m     Hep C Screening: today  STD testing and prevention (HIV/chl/gon/syphilis): N/A Intimate partner violence: negative Sexual History (Partners/Practices/Protection from [employer] hx STI/Pregnancy Plans): takes progesterone daily to keep her from bleeding  Pain during Intercourse: not applicable Menstrual History/LMP/Abnormal Bleeding: no cycles  Incontinence Symptoms: no problems   Breast cancer:  - Last Mammogram: N/A - BRCA gene screening: N/A  Osteoporosis: Discussed high calcium and vitamin D supplementation, weight bearing exercises  Cervical cancer screening:  she sees gyn   Skin cancer: Discussed monitoring for atypical lesions   Advanced Care Planning: A voluntary discussion about advance care planning including the explanation and discussion of advance directives.  Discussed health care proxy and Living will, and the patient was able to identify a health care proxy as mother .  Patient does not have a living will at present time.   Lipids: Lab Results  Component Value Date   CHOL 159 10/01/2018   CHOL 171 09/24/2012   Lab Results  Component Value Date   HDL 49 10/01/2018   HDL 62 09/24/2012   Lab Results  Component Value Date   LDLCALC 100 10/01/2018   LDLCALC 90 09/24/2012   Lab  Results  Component Value Date   TRIG 50 10/01/2018   TRIG 97 09/24/2012   No results found for: CHOLHDL No results found for: LDLDIRECT  Glucose: Glucose  Date Value Ref Range Status  04/08/2013 86 65 - 99 mg/dL Final    Patient Active Problem List   Diagnosis Date Noted  . Bipolar 2 disorder ([place]) 12/15/2018  . Fever blister 07/29/2016  . Menorrhagia with irregular cycle 06/20/2016  . OSA (obstructive sleep apnea) 05/11/2016  . Hypersomnia 12/25/2015  . Snoring 12/25/2015  . Cluster B personality disorder ([place]) 10/27/2015  . H/O urinary tract infection 06/21/2015  . 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 Depression, major, recurrent, moderate ([place]) 06/21/2015  . Gastroparesis 06/21/2015  . Hx of cold sores 06/21/2015  . Generalized anxiety disorder 05/31/2015  . History of pyelonephritis 05/30/2015  . Dysmenorrhea 05/30/2015  . History of asthma 05/30/2015  . Migraine without aura and responsive to treatment 05/30/2015  . Morbid obesity, unspecified obesity type ([place]) 05/30/2015  . Allergic rhinitis, seasonal 05/30/2015  . Vertigo 05/30/2015  . IBS (irritable bowel syndrome) 09/20/2013  . H/O cervical spine surgery 12/29/2012  . Vitamin D deficiency 11/23/2009  . History of mononucleosis 04/03/2009    Past Surgical History:  Procedure Laterality Date  . ADENOIDECTOMY    . APPENDECTOMY    . CHOLECYSTECTOMY    . LUMBAR DISC SURGERY    . TONSILLECTOMY    . TYMPANOSTOMY TUBE PLACEMENT      Family History  Problem Relation Age of Onset  . Depression Mother   . Hypertension Mother        controlled  . Allergic rhinitis Mother   . Anxiety disorder Mother   . Diabetes Father        controlled  . Hypertension Father   . Allergic rhinitis Father   . Depression Father   . Depression Brother   . Anxiety disorder Brother      Current Outpatient Medications:  .  Acetylcysteine (NAC) 600 MG CAPS, Take 1 capsule by mouth 3 (three) times daily., Disp: , Rfl:  .   cetirizine (ZYRTEC) 10 MG tablet, Take by mouth., Disp: , Rfl:  .  clonazePAM (KLONOPIN) 0.5 MG tablet, Take 1 tablet (0.5 mg total) by mouth 2 (two) times daily., Disp: 60 tablet, Rfl: 4 .  fluticasone (FLONASE) 50 MCG/ACT nasal spray, Place into both nostrils daily., Disp: , Rfl:  .  gabapentin (NEURONTIN) 300 MG capsule, Take 300-900 mg by mouth 2 (two) times daily., Disp: , Rfl:  .  LamoTRIgine 200 MG TB24 24 hour tablet, Take 2 tablets by mouth at bedtime., Disp: , Rfl:  .  montelukast (SINGULAIR) 10 MG tablet, Take 1 tablet (10 mg total) by mouth every evening., Disp: 90 tablet, Rfl: 1 .  naproxen (NAPROSYN) 500 MG tablet, Take 1 tablet (500 mg total) by mouth 2 (two) times daily with a meal., Disp: 30 tablet, Rfl: 0 .  norethindrone (AYGESTIN) 5 MG tablet, Take 5 mg by mouth daily., Disp: , Rfl:  .  propranolol (INDERAL) 20 MG tablet, Take 20 mg by mouth 2 (two) times daily., Disp: , Rfl:  .  rizatriptan (MAXALT) 10 MG tablet, Take 1 tablet (10 mg total) by mouth as needed for migraine. May repeat in 2 hours if needed, Disp: 10 tablet, Rfl: 0 .  sertraline (ZOLOFT) 100 MG tablet, Take 150 mg by mouth daily., Disp: , Rfl:  .  tizanidine (ZANAFLEX) 2 MG capsule, Take 1-2 capsules (2-4 mg total) by mouth 3 (three) times daily., Disp: 60 capsule, Rfl: 2 .  topiramate (TOPAMAX) 50 MG tablet, Take 1-3 tablets by mouth 2 (two) times daily. 1 tablet every morning and 3 tablets at night, Disp: , Rfl: 6 .  valACYclovir (VALTREX) 1000 MG tablet, Take 1 tablet (1,000 mg total) by mouth 2 (two) times daily. (Patient taking differently: Take 1,000 mg by mouth 2 (two) times daily as needed. ), Disp: 30 tablet, Rfl: 1 .  VENTOLIN HFA 108 (90 Base) MCG/ACT inhaler, TAKE 2 PUFFS BY MOUTH EVERY 6 HOURS AS NEEDED FOR WHEEZE OR SHORTNESS OF BREATH, Disp: 18 g, Rfl: 0  Allergies  Allergen Reactions  . Apple Itching and Swelling    ONLY certain "FRESH FRUITS" cause throat itches & in spring, throat feels like it  will swell.  (apples, peaches, pears, plums, mango)  . Nutritional Supplements Itching and Swelling    ONLY certain "FRESH FRUITS" cause throat itches & in spring, throat feels like it will swell.  . Prednisone Other (See Comments) and Palpitations    Severe mood swings and tachycardia  . Amoxicillin-Pot Clavulanate Other (See Comments)    Mood swings Mood swings Mood swings     ROS  Constitutional: Negative for fever or weight change.  Respiratory: Negative for cough and shortness of breath.   Cardiovascular: Negative for chest pain or palpitations.  Gastrointestinal: Negative for abdominal pain, no bowel changes.  Musculoskeletal: Negative for gait problem or joint swelling.  Skin: Negative for rash.  Neurological: Negative for dizziness or headache.  No other specific complaints in a complete review of systems (except as listed in HPI above).  Objective   Vitals:   12/10/19 1021  BP: 96/70  Pulse: 78  Resp: 16  Temp: (!) 97.1 F (36.2 C)  TempSrc: Temporal  SpO2: 98%  Weight: 268 lb 3.2 oz (121.7 kg)  Height: 5' 5.5" (1.664 m)    Body mass index is 43.95 kg/m.  Physical Exam  Constitutional: Patient appears well-developed and well-nourished. No distress.  HENT: Head: Normocephalic and atraumatic. Ears: B TMs ok, no erythema or effusion; Nose: Nose normal. Mouth/Throat: not done  Eyes: Conjunctivae and EOM are normal. Pupils are equal, round, and reactive to light. No scleral icterus.  Neck: Normal range of motion. Neck supple. No JVD present. No thyromegaly present.  Cardiovascular: Normal rate, regular rhythm and normal heart sounds.  No murmur heard. No BLE edema. Pulmonary/Chest: Effort normal and breath sounds normal. No respiratory distress. Abdominal: Soft. Bowel sounds are normal, no distension. There is no tenderness. no masses Breast: left breast showed lumps on left upper quadrant, no nipple discharge or rashes FEMALE GENITALIA:  Not done RECTAL: not  done Musculoskeletal: Normal range of motion, no joint effusions. No gross deformities Neurological: he  is alert and oriented to person, place, and time. No cranial nerve deficit. Coordination, balance, strength, speech and gait are normal.  Skin: Skin is warm and dry. No rash noted. No erythema.  Psychiatric: Patient has a normal mood and affect. behavior is normal. Judgment and thought content normal.  Fall Risk: Fall Risk  12/10/2019 12/02/2019 08/27/2019 07/20/2019 04/20/2019  Falls in the past year? 0 _0 0  Number falls in past yr: 0 0 1 1 0  Injury with Fall? 0 0 0 0 0     Functional Status Survey:   1. Mild intermittent asthma without complication  - montelukast (SINGULAIR) 10 MG tablet; Take 1 tablet (10 mg total) by mouth every evening.  Dispense: 90 tablet; Refill: 1  2. Bipolar affective disorder, currently depressed, mild ([place])   3. Morbid obesity, unspecified obesity type [facility])  Discussed with the patient the risk posed by an increased BMI. Discussed importance of portion control, calorie counting and at least 150 minutes of physical activity weekly. Avoid sweet beverages and drink more water. Eat at least 6 servings of fruit and vegetables daily   4. Well adult exam  - Lipid panel - CBC with Differential/Platelet - COMPLETE METABOLIC PANEL WITH GFR - Hemoglobin A1c - VITAMIN D 25 Hydroxy (Vit-D Deficiency, Fractures) - Hepatitis C antibody  5. DDD (degenerative disc disease), cervical   6. Need for hepatitis C screening test  - Hepatitis C antibody  7. Long-term use of high-risk medication  - VITAMIN D 25 Hydroxy (Vit-D Deficiency, Fractures)  8. Diabetes mellitus screening  - CBC with Differential/Platelet - COMPLETE METABOLIC PANEL WITH GFR - Hemoglobin A1c  9. Lipid screening  - Lipid panel  10. Lump in upper inner quadrant of left breast  Advised mammogram and [place], we will send her to [facility] as requested, faxing orders  -USPSTF grade A and B  recommendations reviewed with patient; age-appropriate recommendations, preventive care, screening tests, etc discussed and encouraged; healthy living encouraged; see AVS for patient education given to patient -Discussed importance of 150 minutes of physical activity weekly, eat two servings of fish weekly, eat one serving of tree nuts ( cashews, pistachios, pecans, almonds.Marland Kitchen) every other day, eat 6 servings of fruit/vegetables daily and drink plenty of water and avoid sweet beverages.

## 2019-12-10 NOTE — Patient Instructions (Signed)
Preventive Care 21-31 Years Old, Female Preventive care refers to visits with your health care provider and lifestyle choices that can promote health and wellness. This includes:  A yearly physical exam. This may also be called an annual well check.  Regular dental visits and eye exams.  Immunizations.  Screening for certain conditions.  Healthy lifestyle choices, such as eating a healthy diet, getting regular exercise, not using drugs or products that contain nicotine and tobacco, and limiting alcohol use. What can I expect for my preventive care visit? Physical exam Your health care provider will check your:  Height and weight. This may be used to calculate body mass index (BMI), which tells if you are at a healthy weight.  Heart rate and blood pressure.  Skin for abnormal spots. Counseling Your health care provider may ask you questions about your:  Alcohol, tobacco, and drug use.  Emotional well-being.  Home and relationship well-being.  Sexual activity.  Eating habits.  Work and work environment.  Method of birth control.  Menstrual cycle.  Pregnancy history. What immunizations do I need?  Influenza (flu) vaccine  This is recommended every year. Tetanus, diphtheria, and pertussis (Tdap) vaccine  You may need a Td booster every 10 years. Varicella (chickenpox) vaccine  You may need this if you have not been vaccinated. Human papillomavirus (HPV) vaccine  If recommended by your health care provider, you may need three doses over 6 months. Measles, mumps, and rubella (MMR) vaccine  You may need at least one dose of MMR. You may also need a second dose. Meningococcal conjugate (MenACWY) vaccine  One dose is recommended if you are age 19-21 years and a first-year college student living in a residence hall, or if you have one of several medical conditions. You may also need additional booster doses. Pneumococcal conjugate (PCV13) vaccine  You may need  this if you have certain conditions and were not previously vaccinated. Pneumococcal polysaccharide (PPSV23) vaccine  You may need one or two doses if you smoke cigarettes or if you have certain conditions. Hepatitis A vaccine  You may need this if you have certain conditions or if you travel or work in places where you may be exposed to hepatitis A. Hepatitis B vaccine  You may need this if you have certain conditions or if you travel or work in places where you may be exposed to hepatitis B. Haemophilus influenzae type b (Hib) vaccine  You may need this if you have certain conditions. You may receive vaccines as individual doses or as more than one vaccine together in one shot (combination vaccines). Talk with your health care provider about the risks and benefits of combination vaccines. What tests do I need?  Blood tests  Lipid and cholesterol levels. These may be checked every 5 years starting at age 20.  Hepatitis C test.  Hepatitis B test. Screening  Diabetes screening. This is done by checking your blood sugar (glucose) after you have not eaten for a while (fasting).  Sexually transmitted disease (STD) testing.  BRCA-related cancer screening. This may be done if you have a family history of breast, ovarian, tubal, or peritoneal cancers.  Pelvic exam and Pap test. This may be done every 3 years starting at age 21. Starting at age 30, this may be done every 5 years if you have a Pap test in combination with an HPV test. Talk with your health care provider about your test results, treatment options, and if necessary, the need for more tests.   Follow these instructions at home: Eating and drinking   Eat a diet that includes fresh fruits and vegetables, whole grains, lean protein, and low-fat dairy.  Take vitamin and mineral supplements as recommended by your health care provider.  Do not drink alcohol if: ? Your health care provider tells you not to drink. ? You are  pregnant, may be pregnant, or are planning to become pregnant.  If you drink alcohol: ? Limit how much you have to 0-1 drink a day. ? Be aware of how much alcohol is in your drink. In the [place] one drink equals one 12 oz bottle of beer (355 mL), one 5 oz glass of wine (148 mL), or one 1 oz glass of hard liquor (44 mL). Lifestyle  Take daily care of your teeth and gums.  Stay active. Exercise for at least 30 minutes on 5 or more days each week.  Do not use any products that contain nicotine or tobacco, such as cigarettes, e-cigarettes, and chewing tobacco. If you need help quitting, ask your health care provider.  If you are sexually active, practice safe sex. Use a condom or other form of birth control (contraception) in order to prevent pregnancy and STIs (sexually transmitted infections). If you plan to become pregnant, see your health care provider for a preconception visit. What's next?  Visit your health care provider once a year for a well check visit.  Ask your health care provider how often you should have your eyes and teeth checked.  Stay up to date on all vaccines. This information is not intended to replace advice given to you by your health care provider. Make sure you discuss any questions you have with your health care provider. Document Revised: 07/16/2018 Document Reviewed: 07/16/2018 Elsevier Patient Education  2020 [employer].

## 2019-12-17 ENCOUNTER — Telehealth: Payer: Self-pay

## 2019-12-17 NOTE — Telephone Encounter (Signed)
Copied from [place] [phone]. Topic: General - Inquiry >> Dec 17, 2019  2:07 PM Reyne Dumas L wrote: Reason for CRM:   Pt states that she is supposed to be getting an ultrasound, but no one has told her when or where to go.

## 2019-12-22 ENCOUNTER — Encounter: Payer: Self-pay | Admitting: Family Medicine

## 2019-12-22 NOTE — Telephone Encounter (Signed)
Pt called back and states she needs to go to [place] for her [place]

## 2019-12-29 ENCOUNTER — Encounter: Payer: Self-pay | Admitting: Family Medicine

## 2020-01-07 ENCOUNTER — Other Ambulatory Visit: Payer: Self-pay

## 2020-01-07 DIAGNOSIS — M546 Pain in thoracic spine: Secondary | ICD-10-CM

## 2020-01-07 DIAGNOSIS — M542 Cervicalgia: Secondary | ICD-10-CM

## 2020-01-07 MED ORDER — NAPROXEN 500 MG PO TABS: 500.0000 mg | ORAL_TABLET | Freq: Two times a day (BID) | ORAL | 0 refills | Status: DC

## 2020-02-25 ENCOUNTER — Other Ambulatory Visit: Payer: Self-pay | Admitting: Family Medicine

## 2020-02-25 DIAGNOSIS — J4520 Mild intermittent asthma, uncomplicated: Secondary | ICD-10-CM

## 2020-05-05 ENCOUNTER — Other Ambulatory Visit: Payer: Self-pay

## 2020-05-05 ENCOUNTER — Ambulatory Visit (INDEPENDENT_AMBULATORY_CARE_PROVIDER_SITE_OTHER): Payer: 59 | Admitting: Family Medicine

## 2020-05-05 ENCOUNTER — Encounter: Payer: Self-pay | Admitting: Family Medicine

## 2020-05-05 ENCOUNTER — Other Ambulatory Visit: Payer: Self-pay | Admitting: Family Medicine

## 2020-05-05 VITALS — BP 120/76 | HR 90 | Temp 97.5°F | Resp 18 | Ht 66.0 in | Wt 290.4 lb

## 2020-05-05 DIAGNOSIS — R202 Paresthesia of skin: Secondary | ICD-10-CM

## 2020-05-05 DIAGNOSIS — M5412 Radiculopathy, cervical region: Secondary | ICD-10-CM | POA: Diagnosis not present

## 2020-05-05 DIAGNOSIS — M4802 Spinal stenosis, cervical region: Secondary | ICD-10-CM

## 2020-05-05 DIAGNOSIS — G8929 Other chronic pain: Secondary | ICD-10-CM | POA: Diagnosis not present

## 2020-05-05 DIAGNOSIS — M5020 Other cervical disc displacement, unspecified cervical region: Secondary | ICD-10-CM

## 2020-05-05 DIAGNOSIS — M5030 Other cervical disc degeneration, unspecified cervical region: Secondary | ICD-10-CM

## 2020-05-05 DIAGNOSIS — Z09 Encounter for follow-up examination after completed treatment for conditions other than malignant neoplasm: Secondary | ICD-10-CM

## 2020-05-05 DIAGNOSIS — M549 Dorsalgia, unspecified: Secondary | ICD-10-CM

## 2020-05-05 DIAGNOSIS — Z9889 Other specified postprocedural states: Secondary | ICD-10-CM

## 2020-05-05 DIAGNOSIS — R200 Anesthesia of skin: Secondary | ICD-10-CM

## 2020-05-05 DIAGNOSIS — M542 Cervicalgia: Secondary | ICD-10-CM | POA: Diagnosis not present

## 2020-05-05 MED ORDER — LIDOCAINE 5 % EX PTCH: 1.0000 | MEDICATED_PATCH | Freq: Two times a day (BID) | CUTANEOUS | 2 refills | Status: DC | PRN

## 2020-05-05 MED ORDER — HYDROCODONE-ACETAMINOPHEN 5-325 MG PO TABS: 0.5000 | ORAL_TABLET | Freq: Two times a day (BID) | ORAL | 0 refills | Status: DC | PRN

## 2020-05-05 MED ORDER — KETOROLAC TROMETHAMINE 60 MG/2ML IM SOLN
60.0000 mg | Freq: Once | INTRAMUSCULAR | Status: AC
  Administered 2020-05-05: 60 mg via INTRAMUSCULAR

## 2020-05-05 MED ORDER — DEXAMETHASONE SODIUM PHOSPHATE 10 MG/ML IJ SOLN
10.0000 mg | Freq: Once | INTRAMUSCULAR | Status: AC
  Administered 2020-05-05: 10 mg via INTRAMUSCULAR

## 2020-05-05 MED ORDER — GABAPENTIN 100 MG PO CAPS: 100.0000 mg | ORAL_CAPSULE | Freq: Two times a day (BID) | ORAL | 1 refills | Status: DC

## 2020-05-05 NOTE — Progress Notes (Signed)
Patient ID: Tracey Perry, female    DOB: 1991-01-30, 29 y.o.   MRN: [id number]  PCP: Steele Sizer, MD  Chief Complaint  Patient presents with  . Follow-up    ER for bilateral shoulder, neck and back pain    Subjective:   Tracey Perry is a 29 y.o. female, presents to clinic with CC of the following:  HPI    Pt presents for f/up after ER visit for b/l neck and shoulder pain Acute on chronic neck issues, started gradually worsening about 6 months ago, pain is severe, constant with intermittent sharp shooting pain and numbness to left hand, new to 4th and 5th digits, she cannot turn her head - not even 5 degrees She did come here a few months ago for same, but referral was not approved and she was told she had to see neurology first - neuro is managing migraines and some of the neck pain.  ER visit notes, labs and imaging all reviewed today through care everywhere- Tx with flexeril, gabapentin, mobic, pain still severe   Flexeril just makes her fall asleep, doesn't change pain On gabapentin 300 am 300 noon and 1200 evening  Pain is usually the worst in the am Lidocaine patch from ER needs PA, she would like to try   Sees neurology for migraines Left 1-3 fingers numb since 2014 neck surgery and recently 4-5th fingers have been numb and "weird feeling" for a couple months, associated with her worsening cervical spine pain stiffness, rotation to left is worse than to right  MRI c-spine reviewed: --Multilevel degenerative changes of the cervical spine, which are slightly progressed since 2016. Findings are most prominent at C5-C6 where diffuse disc bulge with superimposed disc protrusion, slightly increased in size which results in moderate spinal canal narrowing, as well as mild left and moderate right neural foraminal narrowing.    [facility] Emergency Department Provider Note  ED Clinical Impression   Final diagnoses:  Neck pain (Primary)  Acute pain  of left shoulder   Initial Impression, ED Course, Assessment and Plan   Patient is a 29 y.o. female with PMH of C6-C7 cervical fusion (2014), asthma, anxiety, bipolar disorder, and Cluster B personality disorder presenting for intermittent episodes of sharp, stabbing, and shooting neck pain which radiates down her left shoulder and arm with associated left 4th and 5th digit numbness which initially began in December 2020, but worsened yesterday.   On exam, the patient is overall well appearing. VS are unremarkable. Exam is notable for diffuse tenderness to palpation of the left cervical paraspinous area. Pain increases with ROM. Decreased ROM in LUE 2/2 pain. Strength 5/5 to BUE.   Differential includes low suspicion for infectious process vs low suspicion for cervical disc rupture vs musculoskeletal pain vs nerve impingement   Plan for MRI c-spine, EKG, basic labs, sed rate, CRP, and Upreg.   3:00 PM  Sed rate elevated to 45. CRP elevated to 11.7. CBC and CMP are unremarkable. Pending UPreg and MRI c-spine. Will give a dose of Mobic for pain.   5 PM Signed out to oncoming APP with plan to disposition based on pending studies and reevaluation of pain.   Additional Medical Decision Making   I independently visualized the EKG tracing.  I reviewed the patient's prior medical records.  I independently visualized the radiology images.    Patient Active Problem List   Diagnosis Date Noted  . Bipolar 2 disorder ([place]) 12/15/2018  . Fever  blister 07/29/2016  . Menorrhagia with irregular cycle 06/20/2016  . OSA (obstructive sleep apnea) 05/11/2016  . Hypersomnia 12/25/2015  . Snoring 12/25/2015  . Cluster B personality disorder ([place]) 10/27/2015  . H/O urinary tract infection 06/21/2015  . 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 Depression, major, recurrent, moderate ([place]) 06/21/2015  . Gastroparesis 06/21/2015  . Hx of cold sores 06/21/2015  . Generalized anxiety disorder 05/31/2015  .  History of pyelonephritis 05/30/2015  . Dysmenorrhea 05/30/2015  . History of asthma 05/30/2015  . Migraine without aura and responsive to treatment 05/30/2015  . Morbid obesity, unspecified obesity type ([place]) 05/30/2015  . Allergic rhinitis, seasonal 05/30/2015  . Vertigo 05/30/2015  . IBS (irritable bowel syndrome) 09/20/2013  . H/O cervical spine surgery 12/29/2012  . Vitamin D deficiency 11/23/2009  . History of mononucleosis 04/03/2009      Current Outpatient Medications:  .  cetirizine (ZYRTEC) 10 MG tablet, Take by mouth., Disp: , Rfl:  .  clonazePAM (KLONOPIN) 0.5 MG tablet, Take 1 tablet (0.5 mg total) by mouth 2 (two) times daily., Disp: 60 tablet, Rfl: 4 .  cyclobenzaprine (FLEXERIL) 10 MG tablet, Take by mouth., Disp: , Rfl:  .  fluticasone (FLONASE) 50 MCG/ACT nasal spray, Place into both nostrils daily., Disp: , Rfl:  .  gabapentin (NEURONTIN) 300 MG capsule, Take 300-900 mg by mouth 2 (two) times daily., Disp: , Rfl:  .  LamoTRIgine 200 MG TB24 24 hour tablet, Take 2 tablets by mouth at bedtime., Disp: , Rfl:  .  montelukast (SINGULAIR) 10 MG tablet, TAKE 1 TABLET BY MOUTH EVERY EVENING, Disp: 90 tablet, Rfl: 2 .  norethindrone (AYGESTIN) 5 MG tablet, Take 5 mg by mouth daily., Disp: , Rfl:  .  propranolol (INDERAL) 20 MG tablet, Take 20 mg by mouth 2 (two) times daily., Disp: , Rfl:  .  risperiDONE (RISPERDAL) 0.5 MG tablet, Take by mouth., Disp: , Rfl:  .  rizatriptan (MAXALT) 10 MG tablet, Take 1 tablet (10 mg total) by mouth as needed for migraine. May repeat in 2 hours if needed, Disp: 10 tablet, Rfl: 0 .  sertraline (ZOLOFT) 100 MG tablet, Take 150 mg by mouth daily., Disp: , Rfl:  .  valACYclovir (VALTREX) 1000 MG tablet, Take 1 tablet (1,000 mg total) by mouth 2 (two) times daily. (Patient taking differently: Take 1,000 mg by mouth 2 (two) times daily as needed. ), Disp: 30 tablet, Rfl: 1 .  VENTOLIN HFA 108 (90 Base) MCG/ACT inhaler, TAKE 2 PUFFS BY MOUTH EVERY 6  HOURS AS NEEDED FOR WHEEZE OR SHORTNESS OF BREATH, Disp: 18 g, Rfl: 0 .  Acetylcysteine (NAC) 600 MG CAPS, Take 1 capsule by mouth 3 (three) times daily. (Patient not taking: Reported on 05/05/2020), Disp: , Rfl:  .  naproxen (NAPROSYN) 500 MG tablet, Take 1 tablet (500 mg total) by mouth 2 (two) times daily with a meal. (Patient not taking: Reported on 05/05/2020), Disp: 30 tablet, Rfl: 0 .  tizanidine (ZANAFLEX) 2 MG capsule, Take 1-2 capsules (2-4 mg total) by mouth 3 (three) times daily. (Patient not taking: Reported on 05/05/2020), Disp: 60 capsule, Rfl: 2 .  topiramate (TOPAMAX) 50 MG tablet, Take 1-3 tablets by mouth 2 (two) times daily. 1 tablet every morning and 3 tablets at night (Patient not taking: Reported on 05/05/2020), Disp: , Rfl: 6   Allergies  Allergen Reactions  . Apple Itching and Swelling    ONLY certain "FRESH FRUITS" cause throat itches & in spring, throat feels like  it will swell.  (apples, peaches, pears, plums, mango)  . Nutritional Supplements Itching and Swelling    ONLY certain "FRESH FRUITS" cause throat itches & in spring, throat feels like it will swell.  . Prednisone Other (See Comments) and Palpitations    Severe mood swings and tachycardia  . Amoxicillin-Pot Clavulanate Other (See Comments)    Mood swings Mood swings Mood swings     Social History   Tobacco Use  . Smoking status: Never Smoker  . Smokeless tobacco: Never Used  Vaping Use  . Vaping Use: Never used  Substance Use Topics  . Alcohol use: Not Currently    Alcohol/week: 0.0 standard drinks  . Drug use: No      Chart Review Today: I personally reviewed active problem list, medication list, allergies, family history, social history, health maintenance, notes from last encounter, lab results, imaging with the patient/caregiver today.   Review of Systems 10 Systems reviewed and are negative for acute change except as noted in the HPI.     Objective:   Vitals:   05/05/20 0931  BP:  120/76  Pulse: 90  Resp: 18  Temp: (!) 97.5 F (36.4 C)  TempSrc: Temporal  SpO2: 99%  Weight: 290 lb 6.4 oz (131.7 kg)  Height: 5\' 6"  (1.676 m)    Body mass index is 46.87 kg/m.  Physical Exam Vitals and nursing note reviewed.  Constitutional:      General: She is not in acute distress.    Appearance: Normal appearance. She is obese. She is not ill-appearing, toxic-appearing or diaphoretic.  HENT:     Head: Normocephalic and atraumatic.  Eyes:     General: No scleral icterus.       Right eye: No discharge.        Left eye: No discharge.     Conjunctiva/sclera: Conjunctivae normal.  Neck:     Trachea: Trachea and phonation normal.     Comments: Very ttp to even light touch with hands or stethoscope to upper thoracic paraspinal muscle area and to cervical paraspinal muscles and upper trapezius b/l Rotation of neck limited to 5 degrees b/l, flexion and extension also limited 5/5 grip strength b/l Left hand decreased sensation to light touch  Cardiovascular:     Rate and Rhythm: Normal rate and regular rhythm.     Pulses: Normal pulses.     Heart sounds: Normal heart sounds.  Pulmonary:     Effort: Pulmonary effort is normal.     Breath sounds: Normal breath sounds.  Abdominal:     General: There is no distension.  Musculoskeletal:     Cervical back: Rigidity present. No edema or erythema. Pain with movement and muscular tenderness present. Decreased range of motion.  Skin:    General: Skin is warm and dry.     Coloration: Skin is not jaundiced or pale.     Findings: No erythema or rash.  Neurological:     Mental Status: She is alert.     Sensory: Sensory deficit present.     Motor: No weakness.  Psychiatric:        Mood and Affect: Mood normal.        Behavior: Behavior normal.      Results for orders placed or performed in visit on 04/20/19  Novel Coronavirus, NAA ([facility])  Result Value Ref Range   SARS-CoV-2, NAA Not Detected Not Detected         Assessment & Plan:     ICD-10-CM  1. Cervical spinal stenosis  M48.02 gabapentin (NEURONTIN) 100 MG capsule    lidocaine (LIDODERM) 5 %    Ambulatory referral to Spine Surgery    HYDROcodone-acetaminophen (NORCO/VICODIN) 5-325 MG tablet    CANCELED: Ambulatory referral to Spine Surgery  2. Cervical radiculopathy  M54.12 gabapentin (NEURONTIN) 100 MG capsule    lidocaine (LIDODERM) 5 %    Ambulatory referral to Spine Surgery    HYDROcodone-acetaminophen (NORCO/VICODIN) 5-325 MG tablet    CANCELED: Ambulatory referral to Spine Surgery  3. Bulging of cervical intervertebral disc  M50.20 gabapentin (NEURONTIN) 100 MG capsule    lidocaine (LIDODERM) 5 %    Ambulatory referral to Spine Surgery    HYDROcodone-acetaminophen (NORCO/VICODIN) 5-325 MG tablet    CANCELED: Ambulatory referral to Spine Surgery  4. Chronic neck and back pain  M54.2 gabapentin (NEURONTIN) 100 MG capsule   M54.9 lidocaine (LIDODERM) 5 %   G89.29 Ambulatory referral to Spine Surgery    dexamethasone (DECADRON) injection 10 mg    ketorolac (TORADOL) injection 60 mg    HYDROcodone-acetaminophen (NORCO/VICODIN) 5-325 MG tablet    CANCELED: Ambulatory referral to Spine Surgery  5. H/O cervical spine surgery  Z98.890 gabapentin (NEURONTIN) 100 MG capsule    Ambulatory referral to Spine Surgery    HYDROcodone-acetaminophen (NORCO/VICODIN) 5-325 MG tablet    CANCELED: Ambulatory referral to Spine Surgery  6. Numbness and tingling in left hand  R20.0 Ambulatory referral to Spine Surgery   R20.2 HYDROcodone-acetaminophen (NORCO/VICODIN) 5-325 MG tablet  7. Encounter for examination following treatment at hospital  Z09    spent more than 10 minutes today with pt reviewing ER visit, imaging results, meds, tx, and her sx since then   Pt agreed to toradol and decadron injection today, does not do well with prednisone Flexeril not helping much, offered other muscle relaxer, encouraged to do heat therapy Lidocaine patch send  in again will try to do PA Discussion on low dose opioid pain meds to help with severe pain - use sparingly, 2.5 -5 mg hydrocodone only ocassionally, discussed risks- pt verbalized understanding, warned of addiction, sedation, OD risks, warned not to drive or take with other sedating meds, encouraged her to use only half tabled to one tablet of lowest dose rarely or once a day. Increase gabapentin dose to [phone] - she cannot tolerate increasing morning or noon dose to 600 - too tired Possibly can try lyrica?  Will put in surgical eval - with repeated MRI done at the ER, stenosis and worsening bulging disc with new and progressive numbness, plus past surgery, pt needs to be reevaluated by a spinal surgeon  Greater than 50% of this visit was spent in direct face-to-face counseling, obtaining history and physical, discussing and educating pt on treatment plan.  Total time of this visit was >45 min for HFU/ER f/up.  Remainder of time involved but was not limited to reviewing chart (recent and pertinent OV notes and labs), documentation in EMR, and coordinating care and treatment plan.     Delsa Grana, PA-C 05/05/20 10:03 AM

## 2020-05-05 NOTE — Telephone Encounter (Signed)
Medication Refill - Medication: Lidocaine & Hydrocodone  Has the patient contacted their pharmacy? Yes.   Patient states that she needs prior authorization for medication to be refilled at pharmacy, patient states she really needs Lidocain (Agent: If no, request that the patient contact the pharmacy for the refill.) (Agent: If yes, when and what did the pharmacy advise?)  Preferred Pharmacy (with phone number or street name): [facility]  Agent: Please be advised that RX refills may take up to 3 business days. We ask that you follow-up with your pharmacy.

## 2020-05-05 NOTE — Patient Instructions (Addendum)
Increase gabapentin dose to 400 am 400 noon and can keep the high dose in the evening per neurology  Use hydrocodone very sparingly as last resort for severe pain - the shots from today and increase in gabapentin meds may help take the edge off  We will be working on the PA for lidocaine patches

## 2020-05-09 ENCOUNTER — Telehealth: Payer: Self-pay

## 2020-05-09 NOTE — Telephone Encounter (Signed)
Where is the PA? Can I review it?  May need to see if Dr. Ancil Boozer or her CMA's have found any other ways to successfully get these to go through.

## 2020-05-09 NOTE — Telephone Encounter (Signed)
Told patient you would take a look at denial and review, but there is certain qualifications/meds that she needs to try first.  But you would review and we would get back to her.

## 2020-05-09 NOTE — Telephone Encounter (Signed)
Lidocaine patch prior auth denied

## 2020-05-09 NOTE — Telephone Encounter (Signed)
In your file/pink folder to look at

## 2020-05-09 NOTE — Telephone Encounter (Signed)
Pt stated she was advised that the PA was denied.Pt stated it is a pain medication so she would like an update asap.

## 2020-05-10 NOTE — Telephone Encounter (Signed)
Pt sent mychart message

## 2020-06-09 ENCOUNTER — Ambulatory Visit: Payer: Commercial Managed Care - PPO | Admitting: Family Medicine

## 2020-06-23 ENCOUNTER — Encounter: Payer: Self-pay | Admitting: Family Medicine

## 2020-06-26 ENCOUNTER — Other Ambulatory Visit: Payer: Self-pay | Admitting: Family Medicine

## 2020-06-26 MED ORDER — NORETHINDRONE ACETATE 5 MG PO TABS: 5.0000 mg | ORAL_TABLET | Freq: Every day | ORAL | 0 refills | Status: DC

## 2020-07-26 NOTE — Progress Notes (Signed)
Name: Tracey Perry   MRN: [id number]    DOB: 08-23-1991   Date:07/28/2020       Progress Note  Subjective  Chief Complaint  Medication refill  HPI    Cervical pain: history of DDD and surgery done in 2014 for treatment of herniated disc and still has sequela of numbness on radial aspect of left arm, however since neck pain recurrence Dec 2020 she has noticed numbness on 5th finger, pain is constant on her neck and radiates to thoracic spine, described as bruised and aching sensation she also has intermittent symptoms on left arm  described as tingling and burning, initially 5 th finger and now also 4 th finger . She states seems slightly weaker on left side, she went to [facility] and had repeat MRI in July , she needs surgery for disc bulging with compression of c-spine however having insurance problems, she may need to go to [facility] to get it approved  Asthma mild intermittent: no problems at this time, no cough, wheezing or sob , she states only uses albuterol prn URI, no symptoms recently  Bipolar 2 : she states she felt back two weeks ago but talking therapist, taking medications and seems to be doing better today. Phq 9 is still high but she states feeling better.   Migraine headaches: she was seeing a neurologist at [facility], but she would like to switch providers, she needs refill of gabapentin for now. She states migraine is once or twice a month, frequency decreased with gabapentin, pain is left frontal area and behind left eye, described as throbbing and sometimes sharp, she has associated phonophobia and some photophobia. Mild nausea but no vomiting. She is able to work most of the time.   Morbid obesity: she is motivated to try to lose weight gain, she is happy on a new job, she also purchased a re-bounder trampoline    OSA: she wakes up feeling tired, sometimes headache in am, also sleepy during the day   Diet: baking more and gained weight through the pandemic  Exercise: discussed going  for daily walks to help her physical and emotional health   Patient Active Problem List   Diagnosis Date Noted  . Cervical spinal stenosis 05/05/2020  . Bulging of cervical intervertebral disc 05/05/2020  . Cervical radiculopathy 05/05/2020  . Bipolar 2 disorder ([place]) 12/15/2018  . Fever blister 07/29/2016  . Menorrhagia with irregular cycle 06/20/2016  . OSA (obstructive sleep apnea) 05/11/2016  . Hypersomnia 12/25/2015  . Snoring 12/25/2015  . Cluster B personality disorder ([place]) 10/27/2015  . H/O urinary tract infection 06/21/2015  . 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 Depression, major, recurrent, moderate ([place]) 06/21/2015  . Gastroparesis 06/21/2015  . Hx of cold sores 06/21/2015  . Generalized anxiety disorder 05/31/2015  . History of pyelonephritis 05/30/2015  . Dysmenorrhea 05/30/2015  . History of asthma 05/30/2015  . Migraine without aura and responsive to treatment 05/30/2015  . Morbid obesity, unspecified obesity type ([place]) 05/30/2015  . Allergic rhinitis, seasonal 05/30/2015  . Vertigo 05/30/2015  . IBS (irritable bowel syndrome) 09/20/2013  . H/O cervical spine surgery 12/29/2012  . Vitamin D deficiency 11/23/2009  . History of mononucleosis 04/03/2009    Past Surgical History:  Procedure Laterality Date  . ADENOIDECTOMY    . APPENDECTOMY    . CHOLECYSTECTOMY    . LUMBAR DISC SURGERY    . TONSILLECTOMY    . TYMPANOSTOMY TUBE PLACEMENT      Family History  Problem Relation  Age of Onset  . Depression Mother   . Hypertension Mother        controlled  . Allergic rhinitis Mother   . Anxiety disorder Mother   . Diabetes Father        controlled  . Hypertension Father   . Allergic rhinitis Father   . Depression Father   . Depression Brother   . Anxiety disorder Brother     Social History   Tobacco Use  . Smoking status: Never Smoker  . Smokeless tobacco: Never Used  Substance Use Topics  . Alcohol use: Not Currently    Alcohol/week: 0.0  standard drinks     Current Outpatient Medications:  .  cetirizine (ZYRTEC) 10 MG tablet, Take by mouth., Disp: , Rfl:  .  clonazePAM (KLONOPIN) 0.5 MG tablet, Take 1 tablet (0.5 mg total) by mouth 2 (two) times daily., Disp: 60 tablet, Rfl: 4 .  fluticasone (FLONASE) 50 MCG/ACT nasal spray, Place into both nostrils daily., Disp: , Rfl:  .  gabapentin (NEURONTIN) 300 MG capsule, Take 1-4 capsules (300-1,200 mg total) by mouth 2 (two) times daily., Disp: 150 capsule, Rfl: 2 .  lamoTRIgine (LAMICTAL) 200 MG tablet, Take 200 mg by mouth 2 (two) times daily., Disp: , Rfl:  .  LamoTRIgine 200 MG TB24 24 hour tablet, Take 2 tablets by mouth at bedtime., Disp: , Rfl:  .  montelukast (SINGULAIR) 10 MG tablet, TAKE 1 TABLET BY MOUTH EVERY EVENING, Disp: 90 tablet, Rfl: 2 .  norethindrone (AYGESTIN) 5 MG tablet, Take 1 tablet (5 mg total) by mouth daily., Disp: 28 tablet, Rfl: 0 .  propranolol (INDERAL) 20 MG tablet, Take 20 mg by mouth 2 (two) times daily., Disp: , Rfl:  .  risperiDONE (RISPERDAL) 0.5 MG tablet, Take by mouth., Disp: , Rfl:  .  sertraline (ZOLOFT) 100 MG tablet, Take 150 mg by mouth daily., Disp: , Rfl:  .  valACYclovir (VALTREX) 1000 MG tablet, Take 1 tablet (1,000 mg total) by mouth 2 (two) times daily. (Patient taking differently: Take 1,000 mg by mouth 2 (two) times daily as needed. ), Disp: 30 tablet, Rfl: 1 .  VENTOLIN HFA 108 (90 Base) MCG/ACT inhaler, TAKE 2 PUFFS BY MOUTH EVERY 6 HOURS AS NEEDED FOR WHEEZE OR SHORTNESS OF BREATH, Disp: 18 g, Rfl: 0 .  rizatriptan (MAXALT) 10 MG tablet, Take 1 tablet (10 mg total) by mouth as needed for migraine. May repeat in 2 hours if needed, Disp: 10 tablet, Rfl: 1  Allergies  Allergen Reactions  . Apple Itching and Swelling    ONLY certain "FRESH FRUITS" cause throat itches & in spring, throat feels like it will swell.  (apples, peaches, pears, plums, mango)  . Nutritional Supplements Itching and Swelling    ONLY certain "FRESH FRUITS"  cause throat itches & in spring, throat feels like it will swell.  . Prednisone Other (See Comments) and Palpitations    Severe mood swings and tachycardia  . Amoxicillin-Pot Clavulanate Other (See Comments)    Mood swings Mood swings Mood swings    I personally reviewed active problem list, medication list, allergies, family history, social history, health maintenance with the patient/caregiver today.   ROS  Constitutional: Negative for fever, positive for weight change.  Respiratory: Negative for cough and shortness of breath.   Cardiovascular: Negative for chest pain or palpitations.  Gastrointestinal: Negative for abdominal pain, no bowel changes.  Musculoskeletal: Negative for gait problem or joint swelling.  Skin: Negative for rash.  Neurological:  Negative for dizziness or headache.  No other specific complaints in a complete review of systems (except as listed in HPI above).  Objective  Vitals:   07/28/20 1521  BP: 100/60  Pulse: 98  Resp: 16  Temp: 98.4 F (36.9 C)  TempSrc: Oral  SpO2: 99%  Weight: (!) 300 lb 6.4 oz (136.3 kg)  Height: 5\' 6"  (1.676 m)    Body mass index is 48.49 kg/m.  Physical Exam  Constitutional: Patient appears well-developed and well-nourished. Obese No distress.  HEENT: head atraumatic, normocephalic, pupils equal and reactive to light, neck supple Cardiovascular: Normal rate, regular rhythm and normal heart sounds.  No murmur heard. No BLE edema. Pulmonary/Chest: Effort normal and breath sounds normal. No respiratory distress. Abdominal: Soft.  There is no tenderness. Psychiatric: Patient has a normal mood and affect. behavior is normal. Judgment and thought content normal. Muscular skeletal: normal rom neck, normal grip  PHQ2/9: Depression screen [facility] 2/9 07/28/2020 05/05/2020 12/10/2019 12/10/2019 12/02/2019  Decreased Interest 2 3 2  0 2  Down, Depressed, Hopeless 3 3 1  0 3  PHQ - 2 Score 5 6 3  0 5  Altered sleeping 3 3 3  0 3   Tired, decreased energy 3 3 3  0 2  Change in appetite 2 3 3  0 3  Feeling bad or failure about yourself  1 3 1  0 1  Trouble concentrating 3 3 3  0 3  Moving slowly or fidgety/restless 0 3 1 0 1  Suicidal thoughts 1 0 2 0 2  PHQ-9 Score 18 24 19  0 20  Difficult doing work/chores Very difficult Very difficult Very difficult - Very difficult  Some recent data might be hidden    phq 9 is positive   Fall Risk: Fall Risk  07/28/2020 07/28/2020 05/05/2020 12/10/2019 12/02/2019  Falls in the past year? 0 0 0 0 1  Number falls in past yr: 0 0 0 0 0  Injury with Fall? 0 0 0 0 0  Follow up - - Falls evaluation completed - -     Functional Status Survey: Is the patient deaf or have difficulty hearing?: Yes Does the patient have difficulty seeing, even when wearing glasses/contacts?: No Does the patient have difficulty concentrating, remembering, or making decisions?: Yes Does the patient have difficulty walking or climbing stairs?: No Does the patient have difficulty dressing or bathing?: No Does the patient have difficulty doing errands alone such as visiting a doctor's office or shopping?: No    Assessment & Plan  1. Cervical herniated disc   2. Cervical radiculopathy  She is waiting to find out if new insurance will cover surgery at [facility]   3. Morbid obesity, unspecified obesity type [facility])  Discussed with the patient the risk posed by an increased BMI. Discussed importance of portion control, calorie counting and at least 150 minutes of physical activity weekly. Avoid sweet beverages and drink more water. Eat at least 6 servings of fruit and vegetables daily   4. OSA (obstructive sleep apnea)  She never used CPAP , insurance did not cover, she will let us know when she gets her new insurance  5. Need for immunization against influenza  - Flu Vaccine QUAD 36+ mos IM  6. Migraine without aura and responsive to treatment  - gabapentin (NEURONTIN) 300 MG capsule; Take 1-4 capsules  (300-1,200 mg total) by mouth 2 (two) times daily.  Dispense: 150 capsule; Refill: 2 - rizatriptan (MAXALT) 10 MG tablet; Take 1 tablet (10 mg total) by mouth  as needed for migraine. May repeat in 2 hours if needed  Dispense: 10 tablet; Refill: 1  7. Need for hepatitis C screening test  She will check to see if covered by insurance  8. Bipolar affective disorder, currently depressed, mild ([place])  Keep follow up with psychiatrist   9. Mild intermittent asthma without complication  Doing well at this time

## 2020-07-28 ENCOUNTER — Encounter: Payer: Self-pay | Admitting: Family Medicine

## 2020-07-28 ENCOUNTER — Ambulatory Visit (INDEPENDENT_AMBULATORY_CARE_PROVIDER_SITE_OTHER): Payer: 59 | Admitting: Family Medicine

## 2020-07-28 ENCOUNTER — Other Ambulatory Visit: Payer: Self-pay

## 2020-07-28 DIAGNOSIS — J4520 Mild intermittent asthma, uncomplicated: Secondary | ICD-10-CM

## 2020-07-28 DIAGNOSIS — G43009 Migraine without aura, not intractable, without status migrainosus: Secondary | ICD-10-CM

## 2020-07-28 DIAGNOSIS — M5412 Radiculopathy, cervical region: Secondary | ICD-10-CM | POA: Diagnosis not present

## 2020-07-28 DIAGNOSIS — Z23 Encounter for immunization: Secondary | ICD-10-CM | POA: Diagnosis not present

## 2020-07-28 DIAGNOSIS — M5020 Other cervical disc displacement, unspecified cervical region: Secondary | ICD-10-CM

## 2020-07-28 DIAGNOSIS — F3131 Bipolar disorder, current episode depressed, mild: Secondary | ICD-10-CM

## 2020-07-28 DIAGNOSIS — G4733 Obstructive sleep apnea (adult) (pediatric): Secondary | ICD-10-CM | POA: Diagnosis not present

## 2020-07-28 DIAGNOSIS — Z1159 Encounter for screening for other viral diseases: Secondary | ICD-10-CM

## 2020-07-28 MED ORDER — RIZATRIPTAN BENZOATE 10 MG PO TABS: 10.0000 mg | ORAL_TABLET | ORAL | 1 refills | Status: DC | PRN

## 2020-07-28 MED ORDER — GABAPENTIN 300 MG PO CAPS
300.0000 mg | ORAL_CAPSULE | Freq: Two times a day (BID) | ORAL | 2 refills | Status: DC
Start: 2020-07-28 — End: 2021-03-14

## 2020-10-27 DIAGNOSIS — Z01419 Encounter for gynecological examination (general) (routine) without abnormal findings: Secondary | ICD-10-CM | POA: Insufficient documentation

## 2020-11-30 ENCOUNTER — Encounter: Payer: Self-pay | Admitting: Family Medicine

## 2020-11-30 ENCOUNTER — Other Ambulatory Visit: Payer: Self-pay

## 2020-11-30 DIAGNOSIS — J4520 Mild intermittent asthma, uncomplicated: Secondary | ICD-10-CM

## 2020-11-30 MED ORDER — MONTELUKAST SODIUM 10 MG PO TABS: 10.0000 mg | ORAL_TABLET | Freq: Every evening | ORAL | 1 refills | Status: DC

## 2020-12-05 NOTE — Progress Notes (Unsigned)
Name: Tracey Perry   MRN: [id number]    DOB: 08/10/1991   Date:12/06/2020       Progress Note  Subjective  Chief Complaint  Acute visit for neck surgery clearance   HPI  Pre-op:  For anterior cervical decompression and fusion by Dr. Dwaine Deter as soon as she is cleared . She developed neck pain with radiculitis end of 2020 but got progressively worse and went [facility] and [facility] and had MRI C-spiine June 2021 that showed DDD . She continues to have intermittent radiculitis on left arm, goes down to 4th and 5th fingers. Sometimes mild weakness on left hand.   MRI c-spine 04/2020 at [facility]   --Multilevel degenerative changes of the cervical spine, which are slightly progressed since 2016. Findings are most prominent at C5-C6 where diffuse disc bulge with superimposed disc protrusion, slightly increased in size which results in moderate spinal canal narrowing, as well as mild left and moderate right neural foraminal narrowing.   CT c-spine done 11/28/2020   Multilevel degenerative changes most notable at C5-C6 where there is mild osseous spinal canal narrowing and moderate neural foraminal narrowing on the right as above.   She had multiple surgeries in the past, gets nauseated but no other reactions. She denies recent URI , no antibiotics in months. No family history of anesthesia reactions.   She denies decrease in exercise tolerance , she has asthma and has some sob with moderate activity  Patient Active Problem List   Diagnosis Date Noted  . Well woman exam with routine gynecological exam 10/27/2020  . Cervical spinal stenosis 05/05/2020  . Bulging of cervical intervertebral disc 05/05/2020  . Cervical radiculopathy 05/05/2020  . Bipolar 2 disorder ([place]) 12/15/2018  . Fever blister 07/29/2016  . Menorrhagia with irregular cycle 06/20/2016  . OSA (obstructive sleep apnea) 05/11/2016  . Hypersomnia 12/25/2015  . Snoring 12/25/2015  . Cluster B personality disorder ([place]) 10/27/2015  . H/O  urinary tract infection 06/21/2015  . 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 Depression, major, recurrent, moderate ([place]) 06/21/2015  . Gastroparesis 06/21/2015  . Hx of cold sores 06/21/2015  . Generalized anxiety disorder 05/31/2015  . History of pyelonephritis 05/30/2015  . Dysmenorrhea 05/30/2015  . History of asthma 05/30/2015  . Migraine without aura and responsive to treatment 05/30/2015  . Morbid obesity, unspecified obesity type ([place]) 05/30/2015  . Allergic rhinitis, seasonal 05/30/2015  . Vertigo 05/30/2015  . IBS (irritable bowel syndrome) 09/20/2013  . H/O cervical spine surgery 12/29/2012  . Vitamin D deficiency 11/23/2009  . History of mononucleosis 04/03/2009    Past Surgical History:  Procedure Laterality Date  . ADENOIDECTOMY    . APPENDECTOMY    . CHOLECYSTECTOMY    . TONSILLECTOMY    . TYMPANOSTOMY TUBE PLACEMENT      Family History  Problem Relation Age of Onset  . Depression Mother   . Hypertension Mother        controlled  . Allergic rhinitis Mother   . Anxiety disorder Mother   . Diabetes Father        controlled  . Hypertension Father   . Allergic rhinitis Father   . Depression Father   . Depression Brother   . Anxiety disorder Brother     Social History   Tobacco Use  . Smoking status: Never Smoker  . Smokeless tobacco: Never Used  Substance Use Topics  . Alcohol use: Not Currently    Alcohol/week: 0.0 standard drinks     Current Outpatient Medications:  .  clonazePAM (KLONOPIN) 0.5 MG tablet, Take 1 tablet (0.5 mg total) by mouth 2 (two) times daily., Disp: 60 tablet, Rfl: 4 .  fluticasone (FLONASE) 50 MCG/ACT nasal spray, Place into both nostrils daily., Disp: , Rfl:  .  gabapentin (NEURONTIN) 300 MG capsule, Take 1-4 capsules (300-1,200 mg total) by mouth 2 (two) times daily., Disp: 150 capsule, Rfl: 2 .  lamoTRIgine (LAMICTAL) 200 MG tablet, Take 200 mg by mouth 2 (two) times daily., Disp: , Rfl:  .  levocetirizine (XYZAL) 5 MG  tablet, Take by mouth., Disp: , Rfl:  .  metFORMIN (GLUCOPHAGE) 500 MG tablet, Take 500 mg by mouth 2 (two) times daily., Disp: , Rfl:  .  montelukast (SINGULAIR) 10 MG tablet, Take 1 tablet (10 mg total) by mouth every evening., Disp: 90 tablet, Rfl: 1 .  mupirocin ointment (BACTROBAN) 2 %, SMARTSIG:1 Application Topical 2-3 Times Daily, Disp: , Rfl:  .  norethindrone (AYGESTIN) 5 MG tablet, Take 1 tablet (5 mg total) by mouth daily., Disp: 28 tablet, Rfl: 0 .  propranolol (INDERAL) 20 MG tablet, Take 20 mg by mouth 2 (two) times daily., Disp: , Rfl:  .  risperiDONE (RISPERDAL) 0.5 MG tablet, Take by mouth., Disp: , Rfl:  .  rizatriptan (MAXALT) 10 MG tablet, Take 1 tablet (10 mg total) by mouth as needed for migraine. May repeat in 2 hours if needed, Disp: 10 tablet, Rfl: 1 .  sertraline (ZOLOFT) 100 MG tablet, Take 150 mg by mouth daily., Disp: , Rfl:  .  topiramate (TOPAMAX) 25 MG tablet, , Disp: , Rfl:  .  valACYclovir (VALTREX) 1000 MG tablet, Take 1 tablet (1,000 mg total) by mouth 2 (two) times daily. (Patient taking differently: Take 1,000 mg by mouth 2 (two) times daily as needed.), Disp: 30 tablet, Rfl: 1 .  valACYclovir (VALTREX) 500 MG tablet, Take 500 mg by mouth daily., Disp: , Rfl:  .  VENTOLIN HFA 108 (90 Base) MCG/ACT inhaler, TAKE 2 PUFFS BY MOUTH EVERY 6 HOURS AS NEEDED FOR WHEEZE OR SHORTNESS OF BREATH, Disp: 18 g, Rfl: 0  Allergies  Allergen Reactions  . Prednisone Other (See Comments) and Palpitations    Severe mood swings and tachycardia  . Amoxicillin-Pot Clavulanate Other (See Comments)    Mood swings Mood swings Mood swings    I personally reviewed active problem list, medication list, allergies, family history, social history, health maintenance with the patient/caregiver today.   ROS  Ten systems reviewed and is negative except as mentioned in HPI   Objective  Vitals:   12/06/20 1139  BP: 90/60  Pulse: 86  Resp: 16  Temp: 98.4 F (36.9 C)  TempSrc:  Oral  SpO2: 99%  Weight: (!) 300 lb 9.6 oz (136.4 kg)  Height: 5\' 6"  (1.676 m)    Body mass index is 48.52 kg/m.  Physical Exam  Constitutional: Patient appears well-developed and well-nourished. Obese No distress.  HEENT: head atraumatic, normocephalic, pupils equal and reactive to light,  neck supple Cardiovascular: Normal rate, regular rhythm and normal heart sounds.  No murmur heard. No BLE edema. Pulmonary/Chest: Effort normal and breath sounds normal. No respiratory distress. Abdominal: Soft.  There is no tenderness. Muscular Skeletal; normal grip , mild decrease rom of neck  Psychiatric: Patient has a normal mood and affect. behavior is normal. Judgment and thought content normal.  PHQ2/9: Depression screen [facility] 2/9 12/06/2020 07/28/2020 05/05/2020 12/10/2019 12/10/2019  Decreased Interest 1 2 3 2  0  Down, Depressed, Hopeless 2 3 3  1  0  PHQ - 2 Score 3 5 6 3  0  Altered sleeping 3 3 3 3  0  Tired, decreased energy 2 3 3 3  0  Change in appetite 2 2 3 3  0  Feeling bad or failure about yourself  2 1 3 1  0  Trouble concentrating 2 3 3 3  0  Moving slowly or fidgety/restless 1 0 3 1 0  Suicidal thoughts 0 1 0 2 0  PHQ-9 Score 15 18 24 19  0  Difficult doing work/chores - Very difficult Very difficult Very difficult -  Some recent data might be hidden    phq 9 is positive but feeling better   Fall Risk: Fall Risk  12/06/2020 07/28/2020 07/28/2020 05/05/2020 12/10/2019  Falls in the past year? 1 0 0 0 0  Number falls in past yr: 1 0 0 0 0  Injury with Fall? 0 0 0 0 0  Follow up - - - Falls evaluation completed -     Functional Status Survey: Is the patient deaf or have difficulty hearing?: No Does the patient have difficulty seeing, even when wearing glasses/contacts?: No Does the patient have difficulty concentrating, remembering, or making decisions?: No Does the patient have difficulty walking or climbing stairs?: No Does the patient have difficulty dressing or bathing?:  No Does the patient have difficulty doing errands alone such as visiting a doctor's office or shopping?: No    Assessment & Plan  1. Cervical herniated disc  May proceed to surgical intervention without further evaluation  2. Cervical radiculopathy

## 2020-12-06 ENCOUNTER — Encounter: Payer: Self-pay | Admitting: Family Medicine

## 2020-12-06 ENCOUNTER — Other Ambulatory Visit: Payer: Self-pay

## 2020-12-06 ENCOUNTER — Ambulatory Visit (INDEPENDENT_AMBULATORY_CARE_PROVIDER_SITE_OTHER): Payer: BC Managed Care – PPO | Admitting: Family Medicine

## 2020-12-06 VITALS — BP 90/60 | HR 86 | Temp 98.4°F | Resp 16 | Ht 66.0 in | Wt 300.6 lb

## 2020-12-06 DIAGNOSIS — Z1159 Encounter for screening for other viral diseases: Secondary | ICD-10-CM

## 2020-12-06 DIAGNOSIS — G8929 Other chronic pain: Secondary | ICD-10-CM | POA: Insufficient documentation

## 2020-12-06 DIAGNOSIS — J45901 Unspecified asthma with (acute) exacerbation: Secondary | ICD-10-CM | POA: Insufficient documentation

## 2020-12-06 DIAGNOSIS — M5020 Other cervical disc displacement, unspecified cervical region: Secondary | ICD-10-CM | POA: Diagnosis not present

## 2020-12-06 DIAGNOSIS — M5412 Radiculopathy, cervical region: Secondary | ICD-10-CM

## 2020-12-06 DIAGNOSIS — J45909 Unspecified asthma, uncomplicated: Secondary | ICD-10-CM | POA: Insufficient documentation

## 2020-12-11 DIAGNOSIS — M4802 Spinal stenosis, cervical region: Secondary | ICD-10-CM | POA: Insufficient documentation

## 2020-12-11 DIAGNOSIS — M4712 Other spondylosis with myelopathy, cervical region: Secondary | ICD-10-CM | POA: Insufficient documentation

## 2020-12-12 HISTORY — PX: OTHER SURGICAL HISTORY: SHX169

## 2020-12-13 ENCOUNTER — Encounter: Payer: 59 | Admitting: Family Medicine

## 2020-12-29 ENCOUNTER — Encounter: Payer: Self-pay | Admitting: Family Medicine

## 2020-12-29 DIAGNOSIS — F419 Anxiety disorder, unspecified: Secondary | ICD-10-CM | POA: Insufficient documentation

## 2021-02-06 ENCOUNTER — Ambulatory Visit: Payer: Self-pay

## 2021-02-06 ENCOUNTER — Ambulatory Visit (INDEPENDENT_AMBULATORY_CARE_PROVIDER_SITE_OTHER): Payer: BC Managed Care – PPO | Admitting: Family Medicine

## 2021-02-06 ENCOUNTER — Encounter: Payer: Self-pay | Admitting: Family Medicine

## 2021-02-06 ENCOUNTER — Other Ambulatory Visit: Payer: Self-pay

## 2021-02-06 VITALS — BP 106/78 | HR 91 | Temp 98.1°F | Resp 16 | Ht 65.0 in | Wt 295.0 lb

## 2021-02-06 DIAGNOSIS — K921 Melena: Secondary | ICD-10-CM

## 2021-02-06 DIAGNOSIS — R300 Dysuria: Secondary | ICD-10-CM | POA: Diagnosis not present

## 2021-02-06 DIAGNOSIS — R1030 Lower abdominal pain, unspecified: Secondary | ICD-10-CM

## 2021-02-06 LAB — HEMOCCULT GUIAC POC 1CARD (OFFICE): Fecal Occult Blood, POC: POSITIVE — AB

## 2021-02-06 MED ORDER — ONDANSETRON HCL 4 MG PO TABS: 4.0000 mg | ORAL_TABLET | Freq: Three times a day (TID) | ORAL | 0 refills | Status: DC | PRN

## 2021-02-06 NOTE — Progress Notes (Signed)
    SUBJECTIVE:   CHIEF COMPLAINT / HPI:   ABDOMINAL ISSUES - noticed blood in stool this morning with bowel movement. Was smeared on toilet paper as well as mixed with stool. - has h/o IBS-D with chronic intermittent lower abdominal pain, doesn't take anything for this.  - has been more nauseous over the past month with one episode of vomiting on Friday.  - has lost about 15 lbs.   Nature: achy, ill-defined Location: lower abdomen Radiation: no Frequency: intermittent Alleviating factors: none Aggravating factors: none Treatments attempted: none Constipation: yes, last BM this morning, before this last was day before yesterday. Diarrhea: yes Episodes of diarrhea/day: 1 Mucous in the stool: no Heartburn: yes Bloating:no Nausea: yes Vomiting: yes, last Friday, just once. No blood in vomit Melena or hematochezia: yes Rash: no Jaundice: no Fever: no Weight loss: yes Prior cholecystectomy and appendectomy. Some suprapubic pain with urination.    OBJECTIVE:   BP 106/78   Pulse 91   Temp 98.1 F (36.7 C) (Oral)   Resp 16   Ht 5\' 5"  (1.651 m)   Wt 295 lb (133.8 kg)   SpO2 99%   BMI 49.09 kg/m   Gen: well appearing, overweight, in NAD Card: RRR Lungs: CTAB Abd: soft, TTP epigastric and suprapubic. Diminshed BS. Non-distended. No organomegaly. No rebound tenderness or guarding. No overlying rashes or skin changes.  Rectal exam: no fissures or external hemorrhoids noted. Perianal tenderness noted. FOBT +. Ext: WWP, no edema   ASSESSMENT/PLAN:   Lower abdominal pain With blood in stool and perianal tenderness on rectal exam, +FOBT today, otherwise rectal exam wnl and without fissures, hemorrhoids. Will obtain CMP, CBC to assess for infectious etiology and refer to GI for possible scope.     Myles Gip, DO

## 2021-02-06 NOTE — Telephone Encounter (Signed)
Pt. Reports she saw moderate amount of red blood mixed in with her stool this morning. States she has had some "constioation issues." Appointment made for today.  Reason for Disposition . MODERATE rectal bleeding (small blood clots, passing blood without stool, or toilet water turns red)  Answer Assessment - Initial Assessment Questions 1. APPEARANCE of BLOOD: "What color is it?" "Is it passed separately, on the surface of the stool, or mixed in with the stool?"      Red mixed in stool 2. AMOUNT: "How much blood was passed?"      Moderate 3. FREQUENCY: "How many times has blood been passed with the stools?"      X 1 4. ONSET: "When was the blood first seen in the stools?" (Days or weeks)      Today 5. DIARRHEA: "Is there also some diarrhea?" If Yes, ask: "How many diarrhea stools were passed in past 24 hours?"      Diarrhea 6. CONSTIPATION: "Do you have constipation?" If Yes, ask: "How bad is it?"     Yes 7. RECURRENT SYMPTOMS: "Have you had blood in your stools before?" If Yes, ask: "When was the last time?" and "What happened that time?"      Yes 8. BLOOD THINNERS: "Do you take any blood thinners?" (e.g., Coumadin/warfarin, Pradaxa/dabigatran, aspirin)     No 9. OTHER SYMPTOMS: "Do you have any other symptoms?"  (e.g., abdominal pain, vomiting, dizziness, fever)     Abdominal pain 10. PREGNANCY: "Is there any chance you are pregnant?" "When was your last menstrual period?"       No  Protocols used: RECTAL BLEEDING-A-AH

## 2021-02-06 NOTE — Patient Instructions (Signed)
It was great to see you!  Our plans for today:  - You have blood in your stool. We are referring you to a GI doctor for further evaluation. Let us know if you don't hear about an appointment in the next few weeks.  - Take the zofran as needed for nausea.   We are checking some labs today, we will release these results to your MyChart.  Take care and seek immediate care sooner if you develop any concerns.   Dr. Ky Barban

## 2021-02-06 NOTE — Assessment & Plan Note (Addendum)
With blood in stool and perianal tenderness on rectal exam, +FOBT today, otherwise rectal exam wnl and without fissures, hemorrhoids. Will obtain CMP, CBC to assess for infectious etiology and refer to GI for possible scope.

## 2021-02-07 ENCOUNTER — Encounter: Payer: Self-pay | Admitting: *Deleted

## 2021-02-07 LAB — URINALYSIS, COMPLETE
Bilirubin Urine: NEGATIVE
Glucose, UA: NEGATIVE
Hgb urine dipstick: NEGATIVE
Ketones, ur: NEGATIVE
Leukocytes,Ua: NEGATIVE
Nitrite: NEGATIVE
Specific Gravity, Urine: 1.038 — ABNORMAL HIGH (ref 1.001–1.03)
pH: <=5 (ref 5.0–8.0)

## 2021-02-07 LAB — CBC WITH DIFFERENTIAL/PLATELET
Absolute Monocytes: 540 cells/uL (ref 200–950)
Basophils Absolute: 38 cells/uL (ref 0–200)
Basophils Relative: 0.5 %
Eosinophils Absolute: 278 cells/uL (ref 15–500)
Eosinophils Relative: 3.7 %
HCT: 39.3 % (ref 35.0–45.0)
Hemoglobin: 13.1 g/dL (ref 11.7–15.5)
Lymphs Abs: 1658 cells/uL (ref 850–3900)
MCH: 31.6 pg (ref 27.0–33.0)
MCHC: 33.3 g/dL (ref 32.0–36.0)
MCV: 94.9 fL (ref 80.0–100.0)
MPV: 10 fL (ref 7.5–12.5)
Monocytes Relative: 7.2 %
Neutro Abs: 4988 cells/uL (ref 1500–7800)
Neutrophils Relative %: 66.5 %
Platelets: 236 10*3/uL (ref 140–400)
RBC: 4.14 10*6/uL (ref 3.80–5.10)
RDW: 11.9 % (ref 11.0–15.0)
Total Lymphocyte: 22.1 %
WBC: 7.5 10*3/uL (ref 3.8–10.8)

## 2021-02-07 MED ORDER — ONDANSETRON HCL 4 MG PO TABS: 4.0000 mg | ORAL_TABLET | Freq: Three times a day (TID) | ORAL | 0 refills | Status: DC | PRN

## 2021-02-16 ENCOUNTER — Other Ambulatory Visit: Payer: Self-pay | Admitting: Family Medicine

## 2021-02-16 NOTE — Telephone Encounter (Signed)
Requested medication (s) are due for refill today - unsure  Requested medication (s) are on the active medication list -yes  Future visit scheduled -yes  Last refill: 02/07/21  Notes to clinic: Request RF non delegated Rx  Requested Prescriptions  Pending Prescriptions Disp Refills   ondansetron (ZOFRAN) 4 MG tablet [Pharmacy Med Name: ONDANSETRON 4MG  TABLETS] 20 tablet 0    Sig: TAKE 1 TABLET BY MOUTH EVERY 8 HOURS AS NEEDED FOR NAUSEA AND VOMITING      Not Delegated - Gastroenterology: Antiemetics Failed - 02/16/2021  8:49 AM      Failed - This refill cannot be delegated      Passed - Valid encounter within last 6 months    Recent Outpatient Visits           1 week ago [place], DO   2 months ago Cervical herniated disc   [facility], Drue Stager, MD   6 months ago Morbid obesity, unspecified obesity type [facility])   [facility] Steele Sizer, MD   9 months ago Cervical spinal stenosis   [facility] Delsa Grana, PA-C   1 year ago Well adult exam   [facility] Steele Sizer, MD       Future Appointments             In 3 weeks Steele Sizer, MD [facility], [facility]                 Requested Prescriptions  Pending Prescriptions Disp Refills   ondansetron (ZOFRAN) 4 MG tablet [Pharmacy Med Name: ONDANSETRON 4MG  TABLETS] 20 tablet 0    Sig: TAKE 1 TABLET BY MOUTH EVERY 8 HOURS AS NEEDED FOR NAUSEA AND VOMITING      Not Delegated - Gastroenterology: Antiemetics Failed - 02/16/2021  8:49 AM      Failed - This refill cannot be delegated      Passed - Valid encounter within last 6 months    Recent Outpatient Visits           1 week ago [place], DO   2 months ago Cervical herniated disc   [facility] Steele Sizer, MD   6 months ago Morbid  obesity, unspecified obesity type [facility])   [facility] Steele Sizer, MD   9 months ago Cervical spinal stenosis   [facility] Delsa Grana, PA-C   1 year ago Well adult exam   [facility] Steele Sizer, MD       Future Appointments             In 3 weeks Steele Sizer, MD [facility], [facility]

## 2021-02-23 ENCOUNTER — Ambulatory Visit: Payer: Commercial Managed Care - PPO | Admitting: Gastroenterology

## 2021-03-13 NOTE — Progress Notes (Signed)
Name: Tracey Perry   MRN: [id number]    DOB: June 08, 1991   Date:03/14/2021       Progress Note  Subjective  Chief Complaint  Annual Exam   HPI  Patient presents for annual CPE.  Diet: packing her lunch for work, she is doing [employer] or eating whatever she has at home Exercise: discussed regular physical activity - 150 minutes per week   [place] Visit from 05/05/2020 in [facility]  AUDIT-C Score 0     Depression: Phq 9 is  positive Depression screen [facility] 2/9 03/14/2021 02/06/2021 12/06/2020 07/28/2020 05/05/2020  Decreased Interest '2 2 1 2 3  ' Down, Depressed, Hopeless '2 1 2 3 3  ' PHQ - 2 Score '4 3 3 5 6  ' Altered sleeping 0 '3 3 3 3  ' Tired, decreased energy '3 3 2 3 3  ' Change in appetite '3 2 2 2 3  ' Feeling bad or failure about yourself  '1 1 2 1 3  ' Trouble concentrating '3 1 2 3 3  ' Moving slowly or fidgety/restless 0 0 1 0 3  Suicidal thoughts 0 1 0 1 0  PHQ-9 Score '14 14 15 18 24  ' Difficult doing work/chores - Somewhat difficult - Very difficult Very difficult  Some recent data might be hidden   Hypertension: BP Readings from Last 3 Encounters:  03/14/21 122/82  02/06/21 106/78  12/06/20 90/60   Obesity: Wt Readings from Last 3 Encounters:  03/14/21 296 lb (134.3 kg)  02/06/21 295 lb (133.8 kg)  12/06/20 (!) 300 lb 9.6 oz (136.4 kg)   BMI Readings from Last 3 Encounters:  03/14/21 49.26 kg/m  02/06/21 49.09 kg/m  12/06/20 48.52 kg/m     Vaccines:   HPV: up to date  Pneumonia: up to date  Flu: 08/17/2020 educated and discussed with patient.  Hep C Screening: Ordered at today's visit  STD testing and prevention (HIV/chl/gon/syphilis): 11/22/2012 Intimate partner violence: negative screen  Sexual History : not sexually active  Menstrual History/LMP/Abnormal Bleeding: no cycles , takes low dose progesterone  daily  Incontinence Symptoms: very seldom has symptoms   Breast cancer:  - Last Mammogram: N/A - BRCA gene screening:  maternal uncle colon cancer, mother breast cancer in situ - she will think about it   Osteoporosis: Discussed high calcium and vitamin D supplementation, weight bearing exercises  Cervical cancer screening: 10/01/2018  Skin cancer: Discussed monitoring for atypical lesions  Colorectal cancer: N/A  ECG: N/A  Advanced Care Planning: A voluntary discussion about advance care planning including the explanation and discussion of advance directives.  Discussed health care proxy and Living will, and the patient was able to identify a health care proxy as mother.   Lipids: Lab Results  Component Value Date   CHOL 159 10/01/2018   CHOL 171 09/24/2012   Lab Results  Component Value Date   HDL 49 10/01/2018   HDL 62 09/24/2012   Lab Results  Component Value Date   LDLCALC 100 10/01/2018   LDLCALC 90 09/24/2012   Lab Results  Component Value Date   TRIG 50 10/01/2018   TRIG 97 09/24/2012   No results found for: CHOLHDL No results found for: LDLDIRECT  Glucose: Glucose  Date Value Ref Range Status  04/08/2013 86 65 - 99 mg/dL Final    Patient Active Problem List   Diagnosis Date Noted  . Lower abdominal pain 02/06/2021  . Anxiety 12/29/2020  . Cervical spondylosis with myelopathy and radiculopathy 12/11/2020  .  Cervical stenosis of spinal canal 12/11/2020  . Asthma 12/06/2020  . Chronic pain 12/06/2020  . Cervical spinal stenosis 05/05/2020  . Bulging of cervical intervertebral disc 05/05/2020  . Cervical radiculopathy 05/05/2020  . Bipolar 2 disorder ([place]) 12/15/2018  . Fever blister 07/29/2016  . Menorrhagia with irregular cycle 06/20/2016  . OSA (obstructive sleep apnea) 05/11/2016  . Hypersomnia 12/25/2015  . Snoring 12/25/2015  . Cluster B personality disorder ([place]) 10/27/2015  . H/O urinary tract infection 06/21/2015  . 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 Depression, major, recurrent, moderate ([place]) 06/21/2015  . Gastroparesis 06/21/2015  . Hx of cold sores  06/21/2015  . Generalized anxiety disorder 05/31/2015  . History of pyelonephritis 05/30/2015  . Dysmenorrhea 05/30/2015  . History of asthma 05/30/2015  . Migraine without aura and responsive to treatment 05/30/2015  . Morbid obesity, unspecified obesity type ([place]) 05/30/2015  . Allergic rhinitis, seasonal 05/30/2015  . Vertigo 05/30/2015  . IBS (irritable bowel syndrome) 09/20/2013  . H/O cervical spine surgery 12/29/2012  . Vitamin D deficiency 11/23/2009  . History of mononucleosis 04/03/2009    Past Surgical History:  Procedure Laterality Date  . ADENOIDECTOMY    . APPENDECTOMY    . CHOLECYSTECTOMY    . TONSILLECTOMY    . TYMPANOSTOMY TUBE PLACEMENT      Family History  Problem Relation Age of Onset  . Depression Mother   . Hypertension Mother        controlled  . Allergic rhinitis Mother   . Anxiety disorder Mother   . Diabetes Father        controlled  . Hypertension Father   . Allergic rhinitis Father   . Depression Father   . Depression Brother   . Anxiety disorder Brother     Social History   Socioeconomic History  . Marital status: Single    Spouse name: Not on file  . Number of children: 0  . Years of education: Not on file  . Highest education level: Master's degree (e.g., MA, MS, MEng, MEd, MSW, MBA)  Occupational History  . Occupation: retail     Comment: she is applying for a position in a [place] Use  . Smoking status: Never Smoker  . Smokeless tobacco: Never Used  Vaping Use  . Vaping Use: Never used  Substance and Sexual Activity  . Alcohol use: Not Currently    Alcohol/week: 0.0 standard drinks  . Drug use: No  . Sexual activity: Never    Birth control/protection: I.U.D.  Other Topics Concern  . 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   Social Determinants of Health   Financial Resource Strain: Low Risk   . Difficulty of Paying  Living Expenses: Not hard at all  Food Insecurity: No Food Insecurity  . Worried About Charity fundraiser in the Last Year: Never true  . Ran Out of Food in the Last Year: Never true  Transportation Needs: No Transportation Needs  . Lack of Transportation (Medical): No  . Lack of Transportation (Non-Medical): No  Physical Activity: Inactive  . Days of Exercise per Week: 0 days  . Minutes of Exercise per Session: 0 min  Stress: Stress Concern Present  . Feeling of Stress : Rather much  Social Connections: Socially Isolated  . Frequency of Communication with Friends and Family: More than three times a week  . Frequency  of Social Gatherings with Friends and Family: Once a week  . Attends Religious Services: Never  . Active Member of Clubs or Organizations: No  . Attends Archivist Meetings: Never  . Marital Status: Never married  Intimate Partner Violence: Not At Risk  . Fear of Current or Ex-Partner: No  . Emotionally Abused: No  . Physically Abused: No  . Sexually Abused: No     Current Outpatient Medications:  .  amitriptyline (ELAVIL) 10 MG tablet, TAKE 1 TABLET 1 HR. BEFORE BEDTIME X 1-2 WEEKS UNTIL NOT DROWSY IN AM. INCREASE BY 1 TABLET EVERY 1-2 WEEKS. UNTIL TAKING 3 TABS AT BEDTIME., Disp: , Rfl:  .  clonazePAM (KLONOPIN) 0.5 MG tablet, Take 1 tablet (0.5 mg total) by mouth 2 (two) times daily., Disp: 60 tablet, Rfl: 4 .  fluticasone (FLONASE) 50 MCG/ACT nasal spray, Place into both nostrils daily., Disp: , Rfl:  .  indomethacin (INDOCIN) 25 MG capsule, Take 1 tablet PO Q8H x 2 weeks, the 2 PO Q8H x 2 weeks, then 3 PO Q8H. DO NOT CRUSH. TAKE WITH FOOD., Disp: , Rfl:  .  lamoTRIgine (LAMICTAL) 200 MG tablet, Take 200 mg by mouth 2 (two) times daily., Disp: , Rfl:  .  levocetirizine (XYZAL) 5 MG tablet, Take by mouth., Disp: , Rfl:  .  metFORMIN (GLUCOPHAGE) 500 MG tablet, Take 500 mg by mouth 2 (two) times daily., Disp: , Rfl:  .  methocarbamol (ROBAXIN) 500 MG  tablet, Take by mouth., Disp: , Rfl:  .  montelukast (SINGULAIR) 10 MG tablet, Take 1 tablet (10 mg total) by mouth every evening., Disp: 90 tablet, Rfl: 1 .  norethindrone (AYGESTIN) 5 MG tablet, Take 1 tablet (5 mg total) by mouth daily., Disp: 28 tablet, Rfl: 0 .  ondansetron (ZOFRAN) 4 MG tablet, TAKE 1 TABLET BY MOUTH EVERY 8 HOURS AS NEEDED FOR NAUSEA AND VOMITING, Disp: 20 tablet, Rfl: 0 .  propranolol (INDERAL) 80 MG tablet, Take 1/2 tablet by mouth AM and 1 tablet by mouth PM x 7 days. Then 1 tablet twice daily., Disp: , Rfl:  .  risperiDONE (RISPERDAL) 0.25 MG tablet, Take 0.25 mg by mouth at bedtime., Disp: , Rfl:  .  sertraline (ZOLOFT) 100 MG tablet, Take 150 mg by mouth daily., Disp: , Rfl:  .  SUMAtriptan (IMITREX) 100 MG tablet, Take 1 tablet by mouth once as needed for migraine. May repeat in 2 hours if needed. DO NOT EXCEED 2 pills/day, 4 pills/week, 8 pills/month., Disp: , Rfl:  .  valACYclovir (VALTREX) 500 MG tablet, Take 500 mg by mouth daily., Disp: , Rfl:  .  VENTOLIN HFA 108 (90 Base) MCG/ACT inhaler, TAKE 2 PUFFS BY MOUTH EVERY 6 HOURS AS NEEDED FOR WHEEZE OR SHORTNESS OF BREATH, Disp: 18 g, Rfl: 0  Allergies  Allergen Reactions  . Prednisone Other (See Comments) and Palpitations    Severe mood swings and tachycardia  . Amoxicillin-Pot Clavulanate Other (See Comments)    Mood swings Mood swings Mood swings     ROS  Constitutional: Negative for fever or weight change.  Respiratory: Negative for cough and shortness of breath.   Cardiovascular: Negative for chest pain or palpitations.  Gastrointestinal: Negative for abdominal pain, no bowel changes.  Musculoskeletal: Negative for gait problem or joint swelling.  Skin: Negative for rash.  Neurological: Negative for dizziness or headache.  No other specific complaints in a complete review of systems (except as listed in HPI above).  Objective  Vitals:  03/14/21 1408  BP: 122/82  Pulse: 93  Resp: 16  Temp:  98.2 F (36.8 C)  TempSrc: Oral  SpO2: 99%  Weight: 296 lb (134.3 kg)  Height: '5\' 5"'  (1.651 m)    Body mass index is 49.26 kg/m.  Physical Exam  Constitutional: Patient appears well-developed and well-nourished. No distress.  HENT: Head: Normocephalic and atraumatic. Ears: B TMs ok, no erythema or effusion; Nose: Nose normal. Mouth/Throat: not done  Eyes: Conjunctivae and EOM are normal. Pupils are equal, round, and reactive to light. No scleral icterus.  Neck: Normal range of motion. Neck supple. No JVD present. No thyromegaly present.  Cardiovascular: Normal rate, regular rhythm and normal heart sounds.  No murmur heard. No BLE edema. Pulmonary/Chest: Effort normal and breath sounds normal. No respiratory distress. Abdominal: Soft. Bowel sounds are normal, no distension. There is no tenderness. no masses Breast:lumpy breasts , no nipple discharge or rashes FEMALE GENITALIA:  Not done  RECTAL: not done  Musculoskeletal: Normal range of motion, no joint effusions. No gross deformities Neurological: he is alert and oriented to person, place, and time. No cranial nerve deficit. Coordination, balance, strength, speech and gait are normal.  Skin: Skin is warm and dry. No rash noted. No erythema.  Psychiatric: Patient has a normal mood and affect. behavior is normal. Judgment and thought content normal.  Recent Results (from the past 2160 hour(s))  CBC with Differential     Status: None   Collection Time: 02/06/21 10:39 AM  Result Value Ref Range   WBC 7.5 3.8 - 10.8 Thousand/uL   RBC 4.14 3.80 - 5.10 Million/uL   Hemoglobin 13.1 11.7 - 15.5 g/dL   HCT 39.3 35.0 - 45.0 %   MCV 94.9 80.0 - 100.0 fL   MCH 31.6 27.0 - 33.0 pg   MCHC 33.3 32.0 - 36.0 g/dL   RDW 11.9 11.0 - 15.0 %   Platelets 236 140 - 400 Thousand/uL   MPV 10.0 7.5 - 12.5 fL   Neutro Abs 4,988 1,500 - 7,800 cells/uL   Lymphs Abs 1,658 850 - 3,900 cells/uL   Absolute Monocytes 540 200 - 950 cells/uL   Eosinophils  Absolute 278 15 - 500 cells/uL   Basophils Absolute 38 0 - 200 cells/uL   Neutrophils Relative % 66.5 %   Total Lymphocyte 22.1 %   Monocytes Relative 7.2 %   Eosinophils Relative 3.7 %   Basophils Relative 0.5 %  Urinalysis, Complete     Status: Abnormal   Collection Time: 02/06/21 10:39 AM  Result Value Ref Range   Color, Urine DARK YELLOW YELLOW   APPearance TURBID (A) CLEAR   Specific Gravity, Urine 1.038 (H) 1.001 - 1.03   pH < OR = 5.0 5.0 - 8.0   Glucose, UA NEGATIVE NEGATIVE   Bilirubin Urine NEGATIVE NEGATIVE   Ketones, ur NEGATIVE NEGATIVE   Hgb urine dipstick NEGATIVE NEGATIVE   Protein, ur 1+ (A) NEGATIVE   Nitrite NEGATIVE NEGATIVE   Leukocytes,Ua NEGATIVE NEGATIVE   WBC, UA 0-5 0 - 5 /HPF   RBC / HPF 0-2 0 - 2 /HPF   Squamous Epithelial / LPF 10-20 (A) < OR = 5 /HPF   Bacteria, UA MODERATE (A) NONE SEEN /HPF   AMORPHOUS SEDIMENT FEW NONE OR FE /HPF   Hyaline Cast 6-10 (A) NONE SEEN /LPF   Note      Comment: This urine was analyzed for the presence of WBC,  RBC, bacteria, casts, and other formed elements.  Only those elements seen were reported. . .   POCT Occult Blood Stool     Status: Abnormal   Collection Time: 02/06/21 11:08 AM  Result Value Ref Range   Fecal Occult Blood, POC Positive (A) Negative   Card #1 Date     Card #2 Fecal Occult Blod, POC     Card #2 Date     Card #3 Fecal Occult Blood, POC     Card #3 Date        Fall Risk: Fall Risk  03/14/2021 02/06/2021 12/06/2020 07/28/2020 07/28/2020  Falls in the past year? 0 1 1 0 0  Number falls in past yr: 0 1 1 0 0  Injury with Fall? 0 1 0 0 0  Follow up - - - - -     Functional Status Survey: Is the patient deaf or have difficulty hearing?: No Does the patient have difficulty seeing, even when wearing glasses/contacts?: No Does the patient have difficulty concentrating, remembering, or making decisions?: Yes Does the patient have difficulty walking or climbing stairs?: No Does the patient  have difficulty dressing or bathing?: No Does the patient have difficulty doing errands alone such as visiting a doctor's office or shopping?: No   Assessment & Plan  1. Well woman exam  - Lipid panel - CBC with Differential/Platelet - COMPLETE METABOLIC PANEL WITH GFR - Hemoglobin A1c - VITAMIN D 25 Hydroxy (Vit-D Deficiency, Fractures)  2. Lipid screening  - Lipid panel  3. Encounter for hepatitis C screening test for low risk patient  - Hepatitis C Antibody  4. Long-term use of high-risk medication  - CBC with Differential/Platelet - COMPLETE METABOLIC PANEL WITH GFR  5. Diabetes mellitus screening  - Hemoglobin A1c  6. Vitamin D deficiency  - VITAMIN D 25 Hydroxy (Vit-D Deficiency, Fractures)   -USPSTF grade A and B recommendations reviewed with patient; age-appropriate recommendations, preventive care, screening tests, etc discussed and encouraged; healthy living encouraged; see AVS for patient education given to patient -Discussed importance of 150 minutes of physical activity weekly, eat two servings of fish weekly, eat one serving of tree nuts ( cashews, pistachios, pecans, almonds.Marland Kitchen) every other day, eat 6 servings of fruit/vegetables daily and drink plenty of water and avoid sweet beverages.

## 2021-03-13 NOTE — Patient Instructions (Signed)
Preventive Care 21-32 Years Old, Female Preventive care refers to lifestyle choices and visits with your health care provider that can promote health and wellness. This includes:  A yearly physical exam. This is also called an annual wellness visit.  Regular dental and eye exams.  Immunizations.  Screening for certain conditions.  Healthy lifestyle choices, such as: ? Eating a healthy diet. ? Getting regular exercise. ? Not using drugs or products that contain nicotine and tobacco. ? Limiting alcohol use. What can I expect for my preventive care visit? Physical exam Your health care provider may check your:  Height and weight. These may be used to calculate your BMI (body mass index). BMI is a measurement that tells if you are at a healthy weight.  Heart rate and blood pressure.  Body temperature.  Skin for abnormal spots. Counseling Your health care provider may ask you questions about your:  Past medical problems.  Family's medical history.  Alcohol, tobacco, and drug use.  Emotional well-being.  Home life and relationship well-being.  Sexual activity.  Diet, exercise, and sleep habits.  Work and work environment.  Access to firearms.  Method of birth control.  Menstrual cycle.  Pregnancy history. What immunizations do I need? Vaccines are usually given at various ages, according to a schedule. Your health care provider will recommend vaccines for you based on your age, medical history, and lifestyle or other factors, such as travel or where you work.   What tests do I need? Blood tests  Lipid and cholesterol levels. These may be checked every 5 years starting at age 20.  Hepatitis C test.  Hepatitis B test. Screening  Diabetes screening. This is done by checking your blood sugar (glucose) after you have not eaten for a while (fasting).  STD (sexually transmitted disease) testing, if you are at risk.  BRCA-related cancer screening. This may be  done if you have a family history of breast, ovarian, tubal, or peritoneal cancers.  Pelvic exam and Pap test. This may be done every 3 years starting at age 21. Starting at age 30, this may be done every 5 years if you have a Pap test in combination with an HPV test. Talk with your health care provider about your test results, treatment options, and if necessary, the need for more tests.   Follow these instructions at home: Eating and drinking  Eat a healthy diet that includes fresh fruits and vegetables, whole grains, lean protein, and low-fat dairy products.  Take vitamin and mineral supplements as recommended by your health care provider.  Do not drink alcohol if: ? Your health care provider tells you not to drink. ? You are pregnant, may be pregnant, or are planning to become pregnant.  If you drink alcohol: ? Limit how much you have to 0-1 drink a day. ? Be aware of how much alcohol is in your drink. In the [place] one drink equals one 12 oz bottle of beer (355 mL), one 5 oz glass of wine (148 mL), or one 1 oz glass of hard liquor (44 mL).   Lifestyle  Take daily care of your teeth and gums. Brush your teeth every morning and night with fluoride toothpaste. Floss one time each day.  Stay active. Exercise for at least 30 minutes 5 or more days each week.  Do not use any products that contain nicotine or tobacco, such as cigarettes, e-cigarettes, and chewing tobacco. If you need help quitting, ask your health care provider.  Do not   use drugs.  If you are sexually active, practice safe sex. Use a condom or other form of protection to prevent STIs (sexually transmitted infections).  If you do not wish to become pregnant, use a form of birth control. If you plan to become pregnant, see your health care provider for a prepregnancy visit.  Find healthy ways to cope with stress, such as: ? Meditation, yoga, or listening to music. ? Journaling. ? Talking to a trusted  person. ? Spending time with friends and family. Safety  Always wear your seat belt while driving or riding in a vehicle.  Do not drive: ? If you have been drinking alcohol. Do not ride with someone who has been drinking. ? When you are tired or distracted. ? While texting.  Wear a helmet and other protective equipment during sports activities.  If you have firearms in your house, make sure you follow all gun safety procedures.  Seek help if you have been physically or sexually abused. What's next?  Go to your health care provider once a year for an annual wellness visit.  Ask your health care provider how often you should have your eyes and teeth checked.  Stay up to date on all vaccines. This information is not intended to replace advice given to you by your health care provider. Make sure you discuss any questions you have with your health care provider. Document Revised: 07/02/2020 Document Reviewed: 07/16/2018 Elsevier Patient Education  2021 [employer]  

## 2021-03-14 ENCOUNTER — Other Ambulatory Visit: Payer: Self-pay

## 2021-03-14 ENCOUNTER — Encounter: Payer: Self-pay | Admitting: Family Medicine

## 2021-03-14 ENCOUNTER — Ambulatory Visit (INDEPENDENT_AMBULATORY_CARE_PROVIDER_SITE_OTHER): Payer: BC Managed Care – PPO | Admitting: Family Medicine

## 2021-03-14 VITALS — BP 122/82 | HR 93 | Temp 98.2°F | Resp 16 | Ht 65.0 in | Wt 296.0 lb

## 2021-03-14 DIAGNOSIS — Z131 Encounter for screening for diabetes mellitus: Secondary | ICD-10-CM

## 2021-03-14 DIAGNOSIS — Z1159 Encounter for screening for other viral diseases: Secondary | ICD-10-CM

## 2021-03-14 DIAGNOSIS — Z01419 Encounter for gynecological examination (general) (routine) without abnormal findings: Secondary | ICD-10-CM | POA: Diagnosis not present

## 2021-03-14 DIAGNOSIS — Z13220 Encounter for screening for lipoid disorders: Secondary | ICD-10-CM

## 2021-03-14 DIAGNOSIS — Z79899 Other long term (current) drug therapy: Secondary | ICD-10-CM | POA: Diagnosis not present

## 2021-03-14 DIAGNOSIS — E559 Vitamin D deficiency, unspecified: Secondary | ICD-10-CM

## 2021-03-15 LAB — CBC WITH DIFFERENTIAL/PLATELET
Absolute Monocytes: 442 cells/uL (ref 200–950)
Basophils Absolute: 52 cells/uL (ref 0–200)
Basophils Relative: 0.8 %
Eosinophils Absolute: 150 cells/uL (ref 15–500)
Eosinophils Relative: 2.3 %
HCT: 38.4 % (ref 35.0–45.0)
Hemoglobin: 12.6 g/dL (ref 11.7–15.5)
Lymphs Abs: 2009 cells/uL (ref 850–3900)
MCH: 31.5 pg (ref 27.0–33.0)
MCHC: 32.8 g/dL (ref 32.0–36.0)
MCV: 96 fL (ref 80.0–100.0)
MPV: 10.1 fL (ref 7.5–12.5)
Monocytes Relative: 6.8 %
Neutro Abs: 3848 cells/uL (ref 1500–7800)
Neutrophils Relative %: 59.2 %
Platelets: 249 10*3/uL (ref 140–400)
RBC: 4 10*6/uL (ref 3.80–5.10)
RDW: 11.8 % (ref 11.0–15.0)
Total Lymphocyte: 30.9 %
WBC: 6.5 10*3/uL (ref 3.8–10.8)

## 2021-03-15 LAB — HEMOGLOBIN A1C
Hgb A1c MFr Bld: 5.3 % of total Hgb (ref <5.7)
Mean Plasma Glucose: 105 mg/dL
eAG (mmol/L): 5.8 mmol/L

## 2021-03-15 LAB — COMPLETE METABOLIC PANEL WITH GFR
AG Ratio: 1.9 (calc) (ref 1.0–2.5)
ALT: 19 U/L (ref 6–29)
AST: 20 U/L (ref 10–30)
Albumin: 4.5 g/dL (ref 3.6–5.1)
Alkaline phosphatase (APISO): 95 U/L (ref 31–125)
BUN: 17 mg/dL (ref 7–25)
CO2: 28 mmol/L (ref 20–32)
Calcium: 9.5 mg/dL (ref 8.6–10.2)
Chloride: 105 mmol/L (ref 98–110)
Creat: 0.97 mg/dL (ref 0.50–1.10)
GFR, Est African American: 90 mL/min/{1.73_m2} (ref >=60)
GFR, Est Non African American: 78 mL/min/{1.73_m2} (ref >=60)
Globulin: 2.4 g/dL (calc) (ref 1.9–3.7)
Glucose, Bld: 86 mg/dL (ref 65–99)
Potassium: 5.2 mmol/L (ref 3.5–5.3)
Sodium: 140 mmol/L (ref 135–146)
Total Bilirubin: 0.6 mg/dL (ref 0.2–1.2)
Total Protein: 6.9 g/dL (ref 6.1–8.1)

## 2021-03-15 LAB — LIPID PANEL
Cholesterol: 186 mg/dL (ref <200)
HDL: 51 mg/dL (ref >=50)
LDL Cholesterol (Calc): 118 mg/dL (calc) — ABNORMAL HIGH
Non-HDL Cholesterol (Calc): 135 mg/dL (calc) — ABNORMAL HIGH (ref <130)
Total CHOL/HDL Ratio: 3.6 (calc) (ref <5.0)
Triglycerides: 71 mg/dL (ref <150)

## 2021-03-15 LAB — VITAMIN D 25 HYDROXY (VIT D DEFICIENCY, FRACTURES): Vit D, 25-Hydroxy: 27 ng/mL — ABNORMAL LOW (ref 30–100)

## 2021-03-15 LAB — HEPATITIS C ANTIBODY
Hepatitis C Ab: NONREACTIVE
SIGNAL TO CUT-OFF: 0.01 (ref <1.00)

## 2021-03-30 ENCOUNTER — Other Ambulatory Visit: Payer: Self-pay

## 2021-03-30 ENCOUNTER — Encounter: Payer: Self-pay | Admitting: Gastroenterology

## 2021-03-30 ENCOUNTER — Ambulatory Visit (INDEPENDENT_AMBULATORY_CARE_PROVIDER_SITE_OTHER): Payer: BC Managed Care – PPO | Admitting: Gastroenterology

## 2021-03-30 VITALS — BP 108/73 | HR 80 | Temp 98.0°F | Ht 65.0 in | Wt 300.1 lb

## 2021-03-30 DIAGNOSIS — K625 Hemorrhage of anus and rectum: Secondary | ICD-10-CM | POA: Diagnosis not present

## 2021-03-30 DIAGNOSIS — R195 Other fecal abnormalities: Secondary | ICD-10-CM | POA: Diagnosis not present

## 2021-03-30 MED ORDER — DICYCLOMINE HCL 10 MG PO CAPS
10.0000 mg | ORAL_CAPSULE | Freq: Three times a day (TID) | ORAL | 0 refills | Status: DC
Start: 2021-03-30 — End: 2021-10-17

## 2021-03-30 MED ORDER — NA SULFATE-K SULFATE-MG SULF 17.5-3.13-1.6 GM/177ML PO SOLN: 354.0000 mL | Freq: Once | ORAL | 0 refills | Status: AC

## 2021-03-30 NOTE — Progress Notes (Signed)
Cephas Darby, MD [address]  [place]  [place], [place] [address]  Main: [phone]  Fax: [phone]    Gastroenterology Consultation  Referring Provider:     Myles Gip, DO Primary Care Physician:  Steele Sizer, MD Primary Gastroenterologist:  Dr. Cephas Darby Reason for Consultation:     Rectal bleeding        HPI:   Tracey Perry is a 30 y.o. female referred by Dr. Steele Sizer, MD  for consultation & management of bright red blood per rectum.  Patient reports that she had an episode of hard bowel movement followed by bright red blood per rectum on wiping as well as dripping into the toilet bowel on 02/06/2021.  She saw Dr. Ancil Boozer at that time, CBC was normal.  Referred to GI for further evaluation.  Patient reports that she generally has loose stools which has been ongoing for almost a year, primarily postprandial associated with some cramps.  She denies any abdominal bloating.  She does notice stress triggers her loose stools as well. Patient brother has history of celiac disease.  Patient reports that she was tested for celiac disease in the past which was negative.  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 Allergy   . Anxiety   . Depression   . Gastroparesis   . Herpes simplex without complication   . Morbid obesity due to excess calories ([place])   . PMDD (premenstrual dysphoric disorder)   . Vertigo   . Vitamin D deficiency     Past Surgical History:  Procedure Laterality Date  . ADENOIDECTOMY    . anterior cervical decompression  Left 12/12/2020   revision with removal of hardware and decompression  - done at [facility]  . APPENDECTOMY    . CHOLECYSTECTOMY    . TONSILLECTOMY    . TYMPANOSTOMY TUBE  PLACEMENT      Current Outpatient Medications:  .  amitriptyline (ELAVIL) 10 MG tablet, TAKE 1 TABLET 1 HR. BEFORE BEDTIME X 1-2 WEEKS UNTIL NOT DROWSY IN AM. INCREASE BY 1 TABLET EVERY 1-2 WEEKS. UNTIL TAKING 3 TABS AT BEDTIME., Disp: , Rfl:  .  clonazePAM (KLONOPIN) 0.5 MG tablet, Take 1 tablet (0.5 mg total) by mouth 2 (two) times daily., Disp: 60 tablet, Rfl: 4 .  dicyclomine (BENTYL) 10 MG capsule, Take 1 capsule (10 mg total) by mouth 3 (three) times daily before meals., Disp: 30 capsule, Rfl: 0 .  fluticasone (FLONASE) 50 MCG/ACT nasal spray, Place into both nostrils daily., Disp: , Rfl:  .  indomethacin (INDOCIN) 25 MG capsule, Take 1 tablet PO Q8H x 2 weeks, the 2 PO Q8H x 2 weeks, then 3 PO Q8H. DO NOT CRUSH. TAKE WITH FOOD., Disp: , Rfl:  .  lamoTRIgine (LAMICTAL) 200 MG tablet, Take 200 mg by mouth 2 (two) times daily., Disp: , Rfl:  .  levocetirizine (XYZAL) 5 MG tablet, Take by mouth., Disp: , Rfl:  .  metFORMIN (GLUCOPHAGE) 500 MG tablet, Take 500 mg by mouth 2 (two) times daily., Disp: , Rfl:  .  methocarbamol (ROBAXIN) 500 MG tablet, Take by mouth., Disp: , Rfl:  .  montelukast (SINGULAIR) 10 MG tablet, Take 1 tablet (10 mg total) by mouth every evening., Disp: 90 tablet, Rfl: 1 .  norethindrone (AYGESTIN) 5 MG tablet, Take 1 tablet (5 mg total) by mouth daily., Disp: 28 tablet, Rfl: 0 .  ondansetron (ZOFRAN) 4 MG tablet, TAKE 1 TABLET BY MOUTH EVERY 8 HOURS AS NEEDED FOR NAUSEA AND VOMITING, Disp: 20 tablet, Rfl: 0 .  propranolol (INDERAL) 80 MG tablet, Take 1/2 tablet by mouth AM and 1 tablet by mouth PM x 7 days. Then 1 tablet twice daily., Disp: , Rfl:  .  risperiDONE (RISPERDAL) 0.25 MG tablet, Take 0.25 mg by mouth at bedtime., Disp: , Rfl:  .  sertraline (ZOLOFT) 100 MG tablet, Take 150 mg by mouth daily., Disp: , Rfl:  .  SUMAtriptan (IMITREX) 100 MG tablet, Take 1 tablet by mouth once as needed for migraine. May repeat in 2 hours if needed. DO NOT EXCEED 2 pills/day, 4  pills/week, 8 pills/month., Disp: , Rfl:  .  valACYclovir (VALTREX) 500 MG tablet, Take 500 mg by mouth daily., Disp: , Rfl:  .  VENTOLIN HFA 108 (90 Base) MCG/ACT inhaler, TAKE 2 PUFFS BY MOUTH EVERY 6 HOURS AS NEEDED FOR WHEEZE OR SHORTNESS OF BREATH, Disp: 18 g, Rfl: 0   Family History  Problem Relation Age of Onset  . Depression Mother   . Hypertension Mother        controlled  . Allergic rhinitis Mother   . Anxiety disorder Mother   . Diabetes Father        controlled  . Hypertension Father   . Allergic rhinitis Father   . Depression Father   . Depression Brother   . Anxiety disorder Brother      Social History   Tobacco Use  . Smoking status: Never Smoker  . Smokeless tobacco: Never Used  Vaping Use  . Vaping Use: Never used  Substance Use Topics  . Alcohol use: Not Currently    Alcohol/week: 0.0 standard drinks  . Drug use: No    Allergies as of 03/30/2021 - Review Complete 03/30/2021  Allergen Reaction Noted  . Prednisone Other (See Comments) and Palpitations 05/29/2015  . Amoxicillin-pot clavulanate Other (See Comments) 03/06/2017    Review of Systems:    All systems reviewed and negative except where noted in HPI.   Physical Exam:  BP 108/73 (BP Location: Left Arm, Patient Position: Sitting, Cuff Size: Normal)   Pulse 80   Temp 98 F (36.7 C) (Oral)   Ht 5\' 5"  (1.651 m)   Wt (!) 300 lb 2 oz (136.1 kg)   BMI 49.94 kg/m  No LMP recorded. (Menstrual status: Oral contraceptives).  General:   Alert,  Well-developed, well-nourished, pleasant and cooperative in NAD Head:  Normocephalic and atraumatic. Eyes:  Sclera clear, no icterus.   Conjunctiva pink. Ears:  Normal auditory acuity. Nose:  No deformity, discharge, or lesions. Mouth:  No deformity or lesions,oropharynx pink & moist. Neck:  Supple; no masses or thyromegaly. Lungs:  Respirations even and unlabored.  Clear throughout to auscultation.   No wheezes, crackles, or rhonchi. No acute  distress. Heart:  Regular rate and rhythm; no murmurs, clicks, rubs, or gallops. Abdomen:  Normal bowel sounds. Soft, non-tender and non-distended without masses, hepatosplenomegaly or hernias noted.  No guarding or rebound tenderness.   Rectal: Not performed Msk:  Symmetrical without gross deformities. Good, equal movement & strength bilaterally. Pulses:  Normal pulses noted. Extremities:  No clubbing or edema.  No cyanosis. Neurologic:  Alert and oriented x3;  grossly normal neurologically. Skin:  Intact without significant lesions or rashes. No jaundice. Psych:  Alert and cooperative. Normal mood and affect.  Imaging Studies: None  Assessment and Plan:   Tracey Perry is a 30 y.o. pleasant Caucasian female with BMI 49.9, history of anxiety, depression is seen in consultation for bright red blood per rectum and loose stools  Recommend diagnostic colonoscopy Recommend H. pylori breath test and treat if positive Trial of Bentyl as needed   Follow up based on the colonoscopy results   Cephas Darby, MD

## 2021-03-31 ENCOUNTER — Encounter: Payer: Self-pay | Admitting: Gastroenterology

## 2021-03-31 LAB — H. PYLORI BREATH TEST: H pylori Breath Test: NEGATIVE

## 2021-04-07 ENCOUNTER — Encounter: Payer: Self-pay | Admitting: Gastroenterology

## 2021-04-09 NOTE — Telephone Encounter (Signed)
Patient is not having no symptoms. Moved patient to 04/30/2021

## 2021-04-25 DIAGNOSIS — G44219 Episodic tension-type headache, not intractable: Secondary | ICD-10-CM | POA: Insufficient documentation

## 2021-04-25 DIAGNOSIS — G4485 Primary stabbing headache: Secondary | ICD-10-CM | POA: Insufficient documentation

## 2021-04-30 ENCOUNTER — Ambulatory Visit: Payer: BC Managed Care – PPO | Admitting: Anesthesiology

## 2021-04-30 ENCOUNTER — Encounter
Admission: RE | Disposition: A | Discharge status: Home / Self Care | Payer: Self-pay | Source: Home / Self Care | Attending: Gastroenterology

## 2021-04-30 ENCOUNTER — Ambulatory Visit
Admission: RE | Admit: 2021-04-30 | Discharge: 2021-04-30 | Disposition: A | Discharge status: Home / Self Care | Payer: BC Managed Care – PPO | Attending: Gastroenterology | Admitting: Gastroenterology

## 2021-04-30 DIAGNOSIS — J45909 Unspecified asthma, uncomplicated: Secondary | ICD-10-CM | POA: Insufficient documentation

## 2021-04-30 DIAGNOSIS — Z7952 Long term (current) use of systemic steroids: Secondary | ICD-10-CM | POA: Insufficient documentation

## 2021-04-30 DIAGNOSIS — Z7951 Long term (current) use of inhaled steroids: Secondary | ICD-10-CM | POA: Insufficient documentation

## 2021-04-30 DIAGNOSIS — Z79899 Other long term (current) drug therapy: Secondary | ICD-10-CM | POA: Insufficient documentation

## 2021-04-30 DIAGNOSIS — Z888 Allergy status to other drugs, medicaments and biological substances status: Secondary | ICD-10-CM | POA: Diagnosis not present

## 2021-04-30 DIAGNOSIS — Z8249 Family history of ischemic heart disease and other diseases of the circulatory system: Secondary | ICD-10-CM | POA: Insufficient documentation

## 2021-04-30 DIAGNOSIS — K633 Ulcer of intestine: Secondary | ICD-10-CM | POA: Diagnosis not present

## 2021-04-30 DIAGNOSIS — K644 Residual hemorrhoidal skin tags: Secondary | ICD-10-CM | POA: Insufficient documentation

## 2021-04-30 DIAGNOSIS — G4730 Sleep apnea, unspecified: Secondary | ICD-10-CM | POA: Diagnosis not present

## 2021-04-30 DIAGNOSIS — Z880 Allergy status to penicillin: Secondary | ICD-10-CM | POA: Insufficient documentation

## 2021-04-30 DIAGNOSIS — K625 Hemorrhage of anus and rectum: Secondary | ICD-10-CM | POA: Insufficient documentation

## 2021-04-30 DIAGNOSIS — Z833 Family history of diabetes mellitus: Secondary | ICD-10-CM | POA: Insufficient documentation

## 2021-04-30 DIAGNOSIS — Z7984 Long term (current) use of oral hypoglycemic drugs: Secondary | ICD-10-CM | POA: Insufficient documentation

## 2021-04-30 HISTORY — PX: COLONOSCOPY WITH PROPOFOL: SHX5780

## 2021-04-30 LAB — POCT PREGNANCY, URINE: Preg Test, Ur: NEGATIVE

## 2021-04-30 SURGERY — COLONOSCOPY WITH PROPOFOL
Anesthesia: General

## 2021-04-30 MED ORDER — SODIUM CHLORIDE 0.9 % IV SOLN
INTRAVENOUS | Status: DC
  Administered 2021-04-30: 20 mL/h via INTRAVENOUS

## 2021-04-30 MED ORDER — LIDOCAINE HCL (CARDIAC) PF 100 MG/5ML IV SOSY
PREFILLED_SYRINGE | INTRAVENOUS | Status: DC | PRN
  Administered 2021-04-30: 40 mg via INTRAVENOUS

## 2021-04-30 MED ORDER — PROPOFOL 500 MG/50ML IV EMUL
INTRAVENOUS | Status: DC | PRN
  Administered 2021-04-30: 150 ug/kg/min via INTRAVENOUS

## 2021-04-30 MED ORDER — DEXMEDETOMIDINE (PRECEDEX) IN NS 20 MCG/5ML (4 MCG/ML) IV SYRINGE
PREFILLED_SYRINGE | INTRAVENOUS | Status: AC
  Filled 2021-04-30: qty 5

## 2021-04-30 MED ORDER — LIDOCAINE HCL (PF) 2 % IJ SOLN
INTRAMUSCULAR | Status: AC
  Filled 2021-04-30: qty 2

## 2021-04-30 MED ORDER — PROPOFOL 500 MG/50ML IV EMUL
INTRAVENOUS | Status: AC
  Filled 2021-04-30: qty 50

## 2021-04-30 MED ORDER — DEXMEDETOMIDINE (PRECEDEX) IN NS 20 MCG/5ML (4 MCG/ML) IV SYRINGE
PREFILLED_SYRINGE | INTRAVENOUS | Status: DC | PRN
  Administered 2021-04-30: 20 ug via INTRAVENOUS

## 2021-04-30 MED ORDER — PROPOFOL 10 MG/ML IV BOLUS
INTRAVENOUS | Status: DC | PRN
  Administered 2021-04-30: 90 mg via INTRAVENOUS
  Administered 2021-04-30: 20 mg via INTRAVENOUS
  Administered 2021-04-30: 10 mg via INTRAVENOUS

## 2021-04-30 NOTE — H&P (Signed)
Cephas Darby, MD [address]  [place]  [place], [place] [address]  Main: [phone]  Fax: [phone] Pager: [phone]  Primary Care Physician:  Steele Sizer, MD Primary Gastroenterologist:  Dr. Cephas Darby  Pre-Procedure History & Physical: HPI:  Tracey Perry is a 30 y.o. female is here for an colonoscopy.   Past Medical History:  Diagnosis Date   Allergy    Anxiety    Depression    Gastroparesis    Herpes simplex without complication    Morbid obesity due to excess calories ([facility])    PMDD (premenstrual dysphoric disorder)    Vertigo    Vitamin D deficiency     Past Surgical History:  Procedure Laterality Date   ADENOIDECTOMY     anterior cervical decompression  Left 12/12/2020   revision with removal of hardware and decompression  - done at [facility]      Prior to Admission medications   Medication Sig Start Date End Date Taking? Authorizing Provider  amitriptyline (ELAVIL) 10 MG tablet TAKE 1 TABLET 1 HR. BEFORE BEDTIME X 1-2 WEEKS UNTIL NOT DROWSY IN AM. INCREASE BY 1 TABLET EVERY 1-2 WEEKS. UNTIL TAKING 3 TABS AT BEDTIME. 12/29/20  Yes [provider]  clonazePAM (KLONOPIN) 0.5 MG tablet Take 1 tablet (0.5 mg total) by mouth 2 (two) times daily. 11/29/15  Yes Marjie Skiff, MD  dicyclomine (BENTYL) 10 MG capsule Take 1 capsule (10 mg total) by mouth 3 (three) times daily before meals. 03/30/21  Yes Jalyne Brodzinski, Tally Due, MD  fluticasone (FLONASE) 50 MCG/ACT nasal spray Place into both nostrils daily.   Yes [provider]  indomethacin (INDOCIN) 25 MG capsule Take 1 tablet PO Q8H x 2 weeks, the 2 PO Q8H x 2 weeks, then 3 PO Q8H. DO NOT CRUSH. TAKE WITH FOOD. 01/01/21  Yes [provider]  lamoTRIgine (LAMICTAL) 200 MG tablet Take 200 mg by mouth 2 (two) times daily. 07/25/20  Yes [provider]  levocetirizine (XYZAL) 5 MG  tablet Take by mouth.   Yes [provider]  metFORMIN (GLUCOPHAGE) 500 MG tablet Take 500 mg by mouth 2 (two) times daily. 11/24/20  Yes [provider]  methocarbamol (ROBAXIN) 500 MG tablet Take by mouth. 01/23/21  Yes [provider]  montelukast (SINGULAIR) 10 MG tablet Take 1 tablet (10 mg total) by mouth every evening. 11/30/20  Yes Sowles, Drue Stager, MD  norethindrone (AYGESTIN) 5 MG tablet Take 1 tablet (5 mg total) by mouth daily. 06/26/20  Yes Sowles, Drue Stager, MD  ondansetron (ZOFRAN) 4 MG tablet TAKE 1 TABLET BY MOUTH EVERY 8 HOURS AS NEEDED FOR NAUSEA AND VOMITING 02/16/21  Yes Sowles, Drue Stager, MD  propranolol (INDERAL) 80 MG tablet Take 1/2 tablet by mouth AM and 1 tablet by mouth PM x 7 days. Then 1 tablet twice daily. 01/01/21  Yes [provider]  risperiDONE (RISPERDAL) 0.25 MG tablet Take 0.25 mg by mouth at bedtime. 12/27/20  Yes [provider]  sertraline (ZOLOFT) 100 MG tablet Take 150 mg by mouth daily. 07/22/19  Yes Vebber, Cherie Ouch, FNP  SUMAtriptan (IMITREX) 100 MG tablet Take 1 tablet by mouth once as needed for migraine. May repeat in 2 hours if needed. DO NOT EXCEED 2 pills/day, 4 pills/week, 8 pills/month. 12/29/20  Yes [provider]  valACYclovir (VALTREX) 500 MG tablet Take 500 mg by  mouth daily. 10/27/20  Yes [provider]  VENTOLIN HFA 108 (90 Base) MCG/ACT inhaler TAKE 2 PUFFS BY MOUTH EVERY 6 HOURS AS NEEDED FOR WHEEZE OR SHORTNESS OF BREATH 09/19/19  Yes Steele Sizer, MD    Allergies as of 03/30/2021 - Review Complete 03/30/2021  Allergen Reaction Noted   Prednisone Other (See Comments) and Palpitations 05/29/2015   Amoxicillin-pot clavulanate Other (See Comments) 03/06/2017    Family History  Problem Relation Age of Onset   Depression Mother    Hypertension Mother        controlled   Allergic rhinitis Mother    Anxiety disorder Mother    Diabetes Father        controlled   Hypertension Father     Allergic rhinitis Father    Depression Father    Depression Brother    Anxiety disorder Brother     Social History   Socioeconomic History   Marital status: Single    Spouse name: Not on file   Number of children: 0   Years of education: Not on file   Highest education level: Master's degree (e.g., MA, MS, MEng, MEd, MSW, MBA)  Occupational History   Occupation: retail     Comment: she is applying for a position in a [place] Use   Smoking status: Never   Smokeless tobacco: Never  Vaping Use   Vaping Use: Never used  Substance and Sexual Activity   Alcohol use: Not Currently    Alcohol/week: 0.0 standard drinks   Drug use: No   Sexual activity: Never    Birth control/protection: I.U.D.  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   Social Determinants of Health   Financial Resource Strain: Low Risk    Difficulty of Paying Living Expenses: Not hard at all  Food Insecurity: No Food Insecurity   Worried About Charity fundraiser in the Last Year: Never true   [place] in the Last Year: Never true  Transportation Needs: No Transportation Needs   Lack of Transportation (Medical): No   Lack of Transportation (Non-Medical): No  Physical Activity: Inactive   Days of Exercise per Week: 0 days   Minutes of Exercise per Session: 0 min  Stress: Stress Concern Present   Feeling of Stress : Rather much  Social Connections: Socially Isolated   Frequency of Communication with Friends and Family: More than three times a week   Frequency of Social Gatherings with Friends and Family: Once a week   Attends Religious Services: Never   Marine scientist or Organizations: No   Attends Music therapist: Never   Marital Status: Never married  Human resources officer Violence: Not At Risk   Fear of Current or Ex-Partner: No   Emotionally Abused:  No   Physically Abused: No   Sexually Abused: No    Review of Systems: See HPI, otherwise negative ROS  Physical Exam: BP (!) 122/92   Pulse 89   Temp (!) 96.8 F (36 C) (Temporal)   Resp 20   Ht 5\' 5"  (1.651 m)   Wt 133.8 kg   SpO2 100%   BMI 49.09 kg/m  General:   Alert,  pleasant and cooperative in NAD Head:  Normocephalic and atraumatic. Neck:  Supple; no masses or thyromegaly. Lungs:  Clear throughout to auscultation.  Heart:  Regular rate and rhythm. Abdomen:  Soft, nontender and nondistended. Normal bowel sounds, without guarding, and without rebound.   Neurologic:  Alert and  oriented x4;  grossly normal neurologically.  Impression/Plan: Tracey Perry is here for an colonoscopy to be performed for rectal bleeding  Risks, benefits, limitations, and alternatives regarding  colonoscopy have been reviewed with the patient.  Questions have been answered.  All parties agreeable.   Sherri Sear, MD  04/30/2021, 8:24 AM

## 2021-04-30 NOTE — Anesthesia Postprocedure Evaluation (Signed)
Anesthesia Post Note  Patient: Tracey Perry  Procedure(s) Performed: COLONOSCOPY WITH PROPOFOL  Patient location during evaluation: Phase II Anesthesia Type: General Level of consciousness: awake and alert, awake and oriented Pain management: pain level controlled Vital Signs Assessment: post-procedure vital signs reviewed and stable Respiratory status: spontaneous breathing, nonlabored ventilation and respiratory function stable Cardiovascular status: blood pressure returned to baseline and stable Postop Assessment: no apparent nausea or vomiting Anesthetic complications: no   No notable events documented.   Last Vitals:  Vitals:   04/30/21 0953 04/30/21 1023  BP: 115/69 135/84  Pulse: 87   Resp: 16 19  Temp: (!) 36.3 C   SpO2: 100%     Last Pain:  Vitals:   04/30/21 1023  TempSrc:   PainSc: 0-No pain                 Phill Mutter

## 2021-04-30 NOTE — Op Note (Signed)
[facility] Gastroenterology Patient Name: Tracey Perry Procedure Date: 04/30/2021 9:11 AM MRN: [id number] Account #: [id number] Date of Birth: Jun 14, 1991 Admit Type: Outpatient Age: 30 Room: [facility] ENDO ROOM 1 Gender: Female Note Status: Finalized Procedure:             Colonoscopy Indications:           This is the patient's first colonoscopy, Rectal                         bleeding Providers:             Lin Landsman MD, MD Referring MD:          Bethena Roys. Sowles, MD (Referring MD) Medicines:             General Anesthesia Complications:         No immediate complications. Estimated blood loss: None. Procedure:             Pre-Anesthesia Assessment:                        - Prior to the procedure, a History and Physical was                         performed, and patient medications and allergies were                         reviewed. The patient is competent. The risks and                         benefits of the procedure and the sedation options and                         risks were discussed with the patient. All questions                         were answered and informed consent was obtained.                         Patient identification and proposed procedure were                         verified by the physician, the nurse, the                         anesthesiologist, the anesthetist and the technician                         in the pre-procedure area in the procedure room in the                         endoscopy suite. Mental Status Examination: alert and                         oriented. Airway Examination: normal oropharyngeal                         airway and neck mobility. Respiratory Examination:  clear to auscultation. CV Examination: normal.                         Prophylactic Antibiotics: The patient does not require                         prophylactic antibiotics. Prior Anticoagulants: The                          patient has taken no previous anticoagulant or                         antiplatelet agents. ASA Grade Assessment: III - A                         patient with severe systemic disease. After reviewing                         the risks and benefits, the patient was deemed in                         satisfactory condition to undergo the procedure. The                         anesthesia plan was to use general anesthesia.                         Immediately prior to administration of medications,                         the patient was re-assessed for adequacy to receive                         sedatives. The heart rate, respiratory rate, oxygen                         saturations, blood pressure, adequacy of pulmonary                         ventilation, and response to care were monitored                         throughout the procedure. The physical status of the                         patient was re-assessed after the procedure.                        After obtaining informed consent, the colonoscope was                         passed under direct vision. Throughout the procedure,                         the patient's blood pressure, pulse, and oxygen                         saturations were monitored continuously. The  Colonoscope was introduced through the anus and                         advanced to the the terminal ileum, with                         identification of the appendiceal orifice and IC                         valve. The colonoscopy was performed without                         difficulty. The patient tolerated the procedure well.                         The quality of the bowel preparation was fair. Findings:      The perianal and digital rectal examinations were normal. Pertinent       negatives include normal sphincter tone and no palpable rectal lesions.      The terminal ileum appeared normal. Biopsies were taken with a cold       forceps  for histology.      A single (solitary) six mm ulcer was found at the ileocecal valve. No       bleeding was present. No stigmata of recent bleeding were seen. Biopsies       were taken with a cold forceps for histology.      Normal mucosa was found in the entire colon.      Non-bleeding external hemorrhoids were found during retroflexion. The       hemorrhoids were medium-sized. Impression:            - Preparation of the colon was fair.                        - The examined portion of the ileum was normal.                         Biopsied.                        - A single (solitary) ulcer at the ileocecal valve.                         Biopsied.                        - Normal mucosa in the entire examined colon.                        - Non-bleeding external hemorrhoids. Recommendation:        - Discharge patient to home (with escort).                        - Resume previous diet today.                        - Continue present medications.                        - Await pathology results.                        -  Return to my office as previously scheduled. Procedure Code(s):     --- Professional ---                        [id number], Colonoscopy, flexible; with biopsy, single or                         multiple Diagnosis Code(s):     --- Professional ---                        K64.4, Residual hemorrhoidal skin tags                        K63.3, Ulcer of intestine                        K62.5, Hemorrhage of anus and rectum CPT copyright 2019 American Medical Association. All rights reserved. The codes documented in this report are preliminary and upon coder review may  be revised to meet current compliance requirements. Dr. Ulyess Mort Lin Landsman MD, MD 04/30/2021 9:52:12 AM This report has been signed electronically. Number of Addenda: 0 Note Initiated On: 04/30/2021 9:11 AM Scope Withdrawal Time: 0 hours 12 minutes 51 seconds  Total Procedure Duration: 0 hours 17 minutes  47 seconds  Estimated Blood Loss:  Estimated blood loss: none.      [facility]

## 2021-04-30 NOTE — Anesthesia Preprocedure Evaluation (Signed)
Anesthesia Evaluation  Patient identified by MRN, date of birth, ID band Patient awake    Reviewed: Allergy & Precautions, NPO status , Patient's Chart, lab work & pertinent test results  History of Anesthesia Complications (+) PONV  Airway Mallampati: II  TM Distance: >3 FB Neck ROM: Full    Dental no notable dental hx.    Pulmonary asthma , sleep apnea ,  OSA Cannot Afford CPAP   Pulmonary exam normal        Cardiovascular negative cardio ROS Normal cardiovascular exam     Neuro/Psych  Headaches, PSYCHIATRIC DISORDERS Anxiety Depression Bipolar Disorder  Neuromuscular disease    GI/Hepatic negative GI ROS, Neg liver ROS, Bowel prep,IBS   Endo/Other  Morbid obesity  Renal/GU negative Renal ROS  negative genitourinary   Musculoskeletal  (+) Arthritis , S/P ACDF   Abdominal   Peds negative pediatric ROS (+)  Hematology negative hematology ROS (+)   Anesthesia Other Findings Anxiety  . Asthma  . Bipolar disorder (CMS-HCC)  . Cervical spondylosis with myelopathy and radiculopathy 12/11/2020  . Depressive disorder, not elsewhere classified  Bipolar  . IBS (irritable bowel syndrome)  . Migraine with aura, without mention of intractable migraine without mention of status migrainosus  . PMDD (premenstrual dysphoric disorder)  . Pyelonephritis   Reproductive/Obstetrics negative OB ROS                             Anesthesia Physical Anesthesia Plan  ASA: 3  Anesthesia Plan: General   Post-op Pain Management:    Induction: Intravenous  PONV Risk Score and Plan: 4 or greater and Propofol infusion and TIVA  Airway Management Planned: Natural Airway and Nasal Cannula  Additional Equipment:   Intra-op Plan:   Post-operative Plan:   Informed Consent: I have reviewed the patients History and Physical, chart, labs and discussed the procedure including the risks, benefits and  alternatives for the proposed anesthesia with the patient or authorized representative who has indicated his/her understanding and acceptance.       Plan Discussed with: CRNA and Surgeon  Anesthesia Plan Comments:         Anesthesia Quick Evaluation

## 2021-04-30 NOTE — Transfer of Care (Signed)
Immediate Anesthesia Transfer of Care Note  Patient: Tracey Perry  Procedure(s) Performed: Procedure(s): COLONOSCOPY WITH PROPOFOL (N/A)  Patient Location: PACU and Endoscopy Unit  Anesthesia Type:General  Level of Consciousness: sedated  Airway & Oxygen Therapy: Patient Spontanous Breathing and Patient connected to nasal cannula oxygen  Post-op Assessment: Report given to RN and Post -op Vital signs reviewed and stable  Post vital signs: Reviewed and stable  Last Vitals:  Vitals:   04/30/21 0815 04/30/21 0953  BP: (!) 122/92 115/69  Pulse: 89 87  Resp: 20 16  Temp: (!) 36 C (!) 36.3 C  SpO2: 326% 712%    Complications: No apparent anesthesia complications

## 2021-05-01 ENCOUNTER — Encounter: Payer: Self-pay | Admitting: Gastroenterology

## 2021-05-01 LAB — SURGICAL PATHOLOGY

## 2021-09-12 ENCOUNTER — Other Ambulatory Visit: Payer: Self-pay

## 2021-09-12 ENCOUNTER — Emergency Department (HOSPITAL_BASED_OUTPATIENT_CLINIC_OR_DEPARTMENT_OTHER): Payer: PRIVATE HEALTH INSURANCE

## 2021-09-12 ENCOUNTER — Encounter (HOSPITAL_BASED_OUTPATIENT_CLINIC_OR_DEPARTMENT_OTHER): Payer: Self-pay

## 2021-09-12 ENCOUNTER — Emergency Department (HOSPITAL_BASED_OUTPATIENT_CLINIC_OR_DEPARTMENT_OTHER)
Admission: EM | Admit: 2021-09-12 | Discharge: 2021-09-12 | Disposition: A | Discharge status: Home / Self Care | Payer: PRIVATE HEALTH INSURANCE | Attending: Emergency Medicine | Admitting: Emergency Medicine

## 2021-09-12 DIAGNOSIS — J45901 Unspecified asthma with (acute) exacerbation: Secondary | ICD-10-CM | POA: Insufficient documentation

## 2021-09-12 DIAGNOSIS — R0602 Shortness of breath: Secondary | ICD-10-CM | POA: Diagnosis present

## 2021-09-12 MED ORDER — IPRATROPIUM BROMIDE 0.02 % IN SOLN
0.5000 mg | Freq: Once | RESPIRATORY_TRACT | Status: AC
  Administered 2021-09-12: 0.5 mg via RESPIRATORY_TRACT
  Filled 2021-09-12: qty 2.5

## 2021-09-12 MED ORDER — ALBUTEROL SULFATE (2.5 MG/3ML) 0.083% IN NEBU
5.0000 mg | INHALATION_SOLUTION | Freq: Once | RESPIRATORY_TRACT | Status: AC
  Administered 2021-09-12: 5 mg via RESPIRATORY_TRACT
  Filled 2021-09-12: qty 6

## 2021-09-12 MED ORDER — DEXAMETHASONE 4 MG PO TABS
16.0000 mg | ORAL_TABLET | Freq: Once | ORAL | Status: AC
  Administered 2021-09-12: 16 mg via ORAL
  Filled 2021-09-12: qty 4

## 2021-09-12 NOTE — ED Provider Notes (Signed)
[facility] Provider Note   CSN: [id number] Arrival date & time: 09/12/21  3888     History Chief Complaint  Patient presents with   Shortness of Breath    Tracey Perry is a 30 y.o. female.  HPI 30 year old female presents with acute shortness of breath.  Started about 30 minutes ago.  Reminds her somewhat of asthma but she has not had an attack about 10 years.  She has been having a little bit of a cough, especially with deep breathing as well as some chest tightness.  No fevers.  No recent illness such as URI.  Tried her albuterol inhaler pump without relief.  Past Medical History:  Diagnosis Date   Allergy    Anxiety    Depression    Gastroparesis    Herpes simplex without complication    Morbid obesity due to excess calories (HCC)    PMDD (premenstrual dysphoric disorder)    Vertigo    Vitamin D deficiency     Patient Active Problem List   Diagnosis Date Noted   Lower abdominal pain 02/06/2021   Anxiety 12/29/2020   Cervical spondylosis with myelopathy and radiculopathy 12/11/2020   Cervical stenosis of spinal canal 12/11/2020   Asthma 12/06/2020   Chronic pain 12/06/2020   Cervical spinal stenosis 05/05/2020   Bulging of cervical intervertebral disc 05/05/2020   Cervical radiculopathy 05/05/2020   Bipolar 2 disorder ([place]) 12/15/2018   Fever blister 07/29/2016   Menorrhagia with irregular cycle 06/20/2016   OSA (obstructive sleep apnea) 05/11/2016   Hypersomnia 12/25/2015   Snoring 12/25/2015   Cluster B personality disorder ([place]) 10/27/2015   H/O urinary tract infection 06/21/2015   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   Depression, major, recurrent, moderate ([place]) 06/21/2015   Gastroparesis 06/21/2015   Hx of cold sores 06/21/2015   Generalized anxiety disorder 05/31/2015   History of pyelonephritis 05/30/2015   Dysmenorrhea 05/30/2015   History of asthma 05/30/2015   Migraine without aura and responsive to treatment  05/30/2015   Morbid obesity, unspecified obesity type ([place]) 05/30/2015   Allergic rhinitis, seasonal 05/30/2015   Vertigo 05/30/2015   IBS (irritable bowel syndrome) 09/20/2013   H/O cervical spine surgery 12/29/2012   Vitamin D deficiency 11/23/2009   History of mononucleosis 04/03/2009    Past Surgical History:  Procedure Laterality Date   ADENOIDECTOMY     anterior cervical decompression  Left 12/12/2020   revision with removal of hardware and decompression  - done at [facility] N/A 04/30/2021   Procedure: COLONOSCOPY WITH PROPOFOL;  Surgeon: Lin Landsman, MD;  Location: [facility] ENDOSCOPY;  Service: Gastroenterology;  Laterality: N/A;   TONSILLECTOMY     TYMPANOSTOMY TUBE PLACEMENT       OB History   No obstetric history on file.     Family History  Problem Relation Age of Onset   Depression Mother    Hypertension Mother        controlled   Allergic rhinitis Mother    Anxiety disorder Mother    Diabetes Father        controlled   Hypertension Father    Allergic rhinitis Father    Depression Father    Depression Brother    Anxiety disorder Brother     Social History   Tobacco Use   Smoking status: Never   Smokeless tobacco: Never  Vaping Use   Vaping Use: Never used  Substance Use Topics   Alcohol use: Not Currently    Alcohol/week: 0.0 standard drinks   Drug use: No    Home Medications Prior to Admission medications   Medication Sig Start Date End Date Taking? Authorizing Provider  amitriptyline (ELAVIL) 10 MG tablet TAKE 1 TABLET 1 HR. BEFORE BEDTIME X 1-2 WEEKS UNTIL NOT DROWSY IN AM. INCREASE BY 1 TABLET EVERY 1-2 WEEKS. UNTIL TAKING 3 TABS AT BEDTIME. 12/29/20   [provider]  clonazePAM (KLONOPIN) 0.5 MG tablet Take 1 tablet (0.5 mg total) by mouth 2 (two) times daily. 11/29/15   Marjie Skiff, MD  dicyclomine (BENTYL) 10 MG capsule Take 1 capsule (10 mg total) by mouth 3  (three) times daily before meals. 03/30/21   Lin Landsman, MD  fluticasone (FLONASE) 50 MCG/ACT nasal spray Place into both nostrils daily.    [provider]  indomethacin (INDOCIN) 25 MG capsule Take 1 tablet PO Q8H x 2 weeks, the 2 PO Q8H x 2 weeks, then 3 PO Q8H. DO NOT CRUSH. TAKE WITH FOOD. 01/01/21   [provider]  lamoTRIgine (LAMICTAL) 200 MG tablet Take 200 mg by mouth 2 (two) times daily. 07/25/20   [provider]  levocetirizine (XYZAL) 5 MG tablet Take by mouth.    [provider]  metFORMIN (GLUCOPHAGE) 500 MG tablet Take 500 mg by mouth 2 (two) times daily. 11/24/20   [provider]  methocarbamol (ROBAXIN) 500 MG tablet Take by mouth. 01/23/21   [provider]  montelukast (SINGULAIR) 10 MG tablet Take 1 tablet (10 mg total) by mouth every evening. 11/30/20   Steele Sizer, MD  norethindrone (AYGESTIN) 5 MG tablet Take 1 tablet (5 mg total) by mouth daily. 06/26/20   Sowles, Drue Stager, MD  ondansetron (ZOFRAN) 4 MG tablet TAKE 1 TABLET BY MOUTH EVERY 8 HOURS AS NEEDED FOR NAUSEA AND VOMITING 02/16/21   Steele Sizer, MD  propranolol (INDERAL) 80 MG tablet Take 1/2 tablet by mouth AM and 1 tablet by mouth PM x 7 days. Then 1 tablet twice daily. 01/01/21   [provider]  risperiDONE (RISPERDAL) 0.25 MG tablet Take 0.25 mg by mouth at bedtime. 12/27/20   [provider]  sertraline (ZOLOFT) 100 MG tablet Take 150 mg by mouth daily. 07/22/19   Elinor Dodge, FNP  SUMAtriptan (IMITREX) 100 MG tablet Take 1 tablet by mouth once as needed for migraine. May repeat in 2 hours if needed. DO NOT EXCEED 2 pills/day, 4 pills/week, 8 pills/month. 12/29/20   [provider]  valACYclovir (VALTREX) 500 MG tablet Take 500 mg by mouth daily. 10/27/20   [provider]  VENTOLIN HFA 108 (90 Base) MCG/ACT inhaler TAKE 2 PUFFS BY MOUTH EVERY 6 HOURS AS NEEDED FOR WHEEZE OR SHORTNESS OF BREATH 09/19/19   Steele Sizer, MD    Allergies    Prednisone and Amoxicillin-pot clavulanate  Review of Systems   Review of Systems  Constitutional:  Negative for fever.  Respiratory:  Positive for cough, chest tightness and shortness of breath.   All other systems reviewed and are negative.  Physical Exam Updated Vital Signs BP 109/72   Pulse 72   Temp 97.6 F (36.4 C) (Oral)   Resp 20   Ht 5\' 5"  (1.651 m)   Wt 133.8 kg   SpO2 100%   BMI 49.09 kg/m   Physical Exam Vitals and nursing note reviewed.  Constitutional:      Appearance: She is well-developed. She  is obese.  HENT:     Head: Normocephalic and atraumatic.     Right Ear: External ear normal.     Left Ear: External ear normal.     Nose: Nose normal.  Eyes:     General:        Right eye: No discharge.        Left eye: No discharge.  Cardiovascular:     Rate and Rhythm: Normal rate and regular rhythm.     Heart sounds: Normal heart sounds.  Pulmonary:     Effort: Pulmonary effort is normal. Tachypnea present.     Breath sounds: Decreased breath sounds present.     Comments: Patient is taking short, shallow breaths.  With this, it is hard to hear any significant abnormal lung sounds but overall is decreased. Abdominal:     Palpations: Abdomen is soft.     Tenderness: There is no abdominal tenderness.  Skin:    General: Skin is warm and dry.  Neurological:     Mental Status: She is alert.  Psychiatric:        Mood and Affect: Mood is anxious.    ED Results / Procedures / Treatments   Labs (all labs ordered are listed, but only abnormal results are displayed) Labs Reviewed - No data to display  EKG EKG Interpretation  Date/Time:  Wednesday September 12 2021 09:44:46 EDT Ventricular Rate:  69 PR Interval:  158 QRS Duration: 82 QT Interval:  392 QTC Calculation: 420 R Axis:   0 Text Interpretation: Normal sinus rhythm with sinus arrhythmia Minimal voltage criteria for LVH, may be normal variant ( R in aVL ) Cannot rule  out Anterior infarct , age undetermined No old tracing to compare Confirmed by Sherwood Gambler [phone]) on 09/12/2021 9:59:25 AM  Radiology DG Chest Portable 1 View  Result Date: 09/12/2021 CLINICAL DATA:  Pt presents with SOB x1 hour, per family. Pt reports hx of asthma but has not had an exacerbation in a while. Pt examined by RT in triage, reports scattered wheezing through out and unable to speak in complete sentences. cough, asthma EXAM: PORTABLE CHEST 1 VIEW COMPARISON:  12/09/2012 FINDINGS: Normal cardiac silhouette. Anterior cervical few noted. Low lung volumes. No focal consolidation. No pneumothorax. No pulmonary edema. IMPRESSION: Low lung volumes.  No acute findings otherwise. Electronically Signed   By: Suzy Bouchard M.D.   On: 09/12/2021 10:28    Procedures Procedures   Medications Ordered in ED Medications  dexamethasone (DECADRON) tablet 16 mg (has no administration in time range)  albuterol (PROVENTIL) (2.5 MG/3ML) 0.083% nebulizer solution 5 mg (5 mg Nebulization Given 09/12/21 0927)  ipratropium (ATROVENT) nebulizer solution 0.5 mg (0.5 mg Nebulization Given 09/12/21 1191)    ED Course  I have reviewed the triage vital signs and the nursing notes.  Pertinent labs & imaging results that were available during my care of the patient were reviewed by me and considered in my medical decision making (see chart for details).    MDM Rules/Calculators/A&P                           Patient was given albuterol treatment and feels significantly better and now feels well enough to go home.  Given her prior history this seems like this is probably asthma exacerbation.  Discussed giving steroids and she states she has only had a problem with prednisone in the past, not others.  We will give a dose  of Decadron.  Otherwise she has an inhaler at home.  Chest x-ray is clear.  Lungs are moving better air now.  ECG is unremarkable.  I doubt cardiac pathology, PE, bacterial pneumonia, etc.   Given return precautions. Final Clinical Impression(s) / ED Diagnoses Final diagnoses:  Asthma with acute exacerbation, unspecified asthma severity, unspecified whether persistent    Rx / DC Orders ED Discharge Orders     None        Sherwood Gambler, MD 09/12/21 1057

## 2021-09-12 NOTE — Discharge Instructions (Signed)
If you develop new or worsening shortness of breath, fever, chest pain, blood in your cough, or any other new/concerning symptoms then return to the ER for evaluation.

## 2021-09-12 NOTE — ED Triage Notes (Signed)
Pt presents with SOB x1 hour, per family. Pt reports hx of asthma but has not had an exacerbation in a while. Pt examined by RT in triage, reports scattered wheezing through out and unable to speak in complete sentences. Pt receiving a breathing tx upon this RN's arrival.

## 2021-09-13 ENCOUNTER — Ambulatory Visit: Payer: No Typology Code available for payment source | Admitting: Unknown Physician Specialty

## 2021-09-13 ENCOUNTER — Encounter: Payer: Self-pay | Admitting: Unknown Physician Specialty

## 2021-09-13 DIAGNOSIS — J4521 Mild intermittent asthma with (acute) exacerbation: Secondary | ICD-10-CM | POA: Diagnosis not present

## 2021-09-13 MED ORDER — BUDESONIDE-FORMOTEROL FUMARATE 160-4.5 MCG/ACT IN AERO: 2.0000 | INHALATION_SPRAY | Freq: Two times a day (BID) | RESPIRATORY_TRACT | 3 refills | Status: DC

## 2021-09-13 NOTE — Assessment & Plan Note (Signed)
Asthma flare with residual symptoms.  Rx for Symbicort to take for about a month.  # months given as mail order.  Breo samples given to help tide her over

## 2021-09-13 NOTE — Progress Notes (Signed)
BP 130/78   Pulse 92   Temp 98.2 F (36.8 C)   Resp 16   Ht 5\' 5"  (1.651 m)   Wt (!) 316 lb (143.3 kg)   SpO2 99%   BMI 52.59 kg/m    Subjective:    Patient ID: Tracey Perry, female    DOB: 09/16/1991, 30 y.o.   MRN: [id number]  HPI: Tracey Perry is a 30 y.o. female  Chief Complaint  Patient presents with   ER Follow Up    Asthma   To the ER yesterday for acute SOB associated with an asthma flare.  She has not had one in 10 years.  Given nebulizer in the ER.  She states she is still SOB as it "won't go away."  Got Dexamethasone but not prednisone.    Relevant past medical, surgical, family and social history reviewed and updated as indicated. Interim medical history since our last visit reviewed. Allergies and medications reviewed and updated.  Review of Systems  Per HPI unless specifically indicated above     Objective:    BP 130/78   Pulse 92   Temp 98.2 F (36.8 C)   Resp 16   Ht 5\' 5"  (1.651 m)   Wt (!) 316 lb (143.3 kg)   SpO2 99%   BMI 52.59 kg/m   Wt Readings from Last 3 Encounters:  09/13/21 (!) 316 lb (143.3 kg)  09/12/21 294 lb 15.6 oz (133.8 kg)  04/30/21 295 lb (133.8 kg)    Physical Exam Constitutional:      General: She is not in acute distress.    Appearance: Normal appearance. She is well-developed.  HENT:     Head: Normocephalic and atraumatic.  Eyes:     General: Lids are normal. No scleral icterus.       Right eye: No discharge.        Left eye: No discharge.     Conjunctiva/sclera: Conjunctivae normal.  Neck:     Vascular: No carotid bruit or JVD.  Cardiovascular:     Rate and Rhythm: Normal rate and regular rhythm.     Heart sounds: Normal heart sounds.  Pulmonary:     Effort: Pulmonary effort is normal. No respiratory distress.     Breath sounds: Normal breath sounds.  Abdominal:     Palpations: There is no hepatomegaly or splenomegaly.  Musculoskeletal:        General: Normal range of motion.     Cervical  back: Normal range of motion and neck supple.  Skin:    General: Skin is warm and dry.     Coloration: Skin is not pale.     Findings: No rash.  Neurological:     Mental Status: She is alert and oriented to person, place, and time.  Psychiatric:        Behavior: Behavior normal.        Thought Content: Thought content normal.        Judgment: Judgment normal.    Results for orders placed or performed during the hospital encounter of 04/30/21  Pregnancy, urine POC  Result Value Ref Range   Preg Test, Ur NEGATIVE NEGATIVE  Surgical pathology  Result Value Ref Range   SURGICAL PATHOLOGY      SURGICAL PATHOLOGY CASE: [id number] PATIENT: Sherlyn Lees Surgical Pathology Report     Specimen Submitted: A. Terminal ileum; cbx B. Ileocecal valve ulcer; cbx  Clinical History: Rectal bleeding. Finding: Ileocecal valve ulcer  DIAGNOSIS: A.  TERMINAL ILEUM; COLD BIOPSY: - ILEAL MUCOSA WITH PRESERVED VILLOUS ARCHITECTURE AND PROMINENT PEYER'S PATCHES. - NEGATIVE FOR ACTIVE INFLAMMATION, INTRAEPITHELIAL LYMPHOCYTOSIS, INFECTIOUS AGENTS, AND GRANULOMAS.  D.  ILEOCECAL VALVE ULCER; COLD BIOPSY: - COLONIC-TYPE MUCOSA WITH ULCERATION. - REACTIVE LYMPHOID HYPERPLASIA. - NEGATIVE FOR INFECTIOUS AGENTS, GRANULOMAS, ISCHEMIC-TYPE CHANGES, DYSPLASIA, AND MALIGNANCY.  Comment: The histologic changes in B are not specific. There is no evidence of cryptitis. No viral cytopathic effects are identified. The differential might include medication effect (especially NSAIDs) and infection. Follow up is suggested.  GROSS DESCRIPTION: A. Labeled: cbx termina l ileum Received: Formalin Collection time: 9:39 AM on 04/30/2021 Placed into formalin time: 9:39 AM on 04/30/2021 Tissue fragment(s): Multiple Size: Aggregate, 1.2 x 0.4 x 0.1 cm Description: Tan soft tissue fragments Entirely submitted in 1 cassette.  B. Labeled: cbx IC valve ulcer Received: Formalin Collection time:  9:42 AM on 04/30/2021 Placed into formalin time: 9:42 AM on 04/30/2021 Tissue fragment(s): 2 Size: Each 0.4 cm Description: Tan soft tissue fragments Entirely submitted in 1 cassette.  RB 04/30/2021  Final Diagnosis performed by Bryan Lemma, MD.   Electronically signed 05/01/2021 5:58:35PM The electronic signature indicates that the named Attending Pathologist has evaluated the specimen Technical component performed at [facility], [address] [place], [place] [address] Lab: [phone] Dir: Rush Farmer, MD, MMM  Professional component performed at [facility], [facility], [place], [place], [place] [address] Lab: [phone]  Dir: Dellia Nims. Reuel Derby, MD       Assessment & Plan:   Problem List Items Addressed This Visit       Unprioritized   Asthma    Asthma flare with residual symptoms.  Rx for Symbicort to take for about a month.  # months given as mail order.  Breo samples given to help tide her over      Relevant Medications   budesonide-formoterol (SYMBICORT) 160-4.5 MCG/ACT inhaler     Follow up plan: Return in about 4 weeks (around 10/11/2021).

## 2021-09-14 ENCOUNTER — Encounter: Payer: Self-pay | Admitting: Unknown Physician Specialty

## 2021-09-17 ENCOUNTER — Ambulatory Visit: Payer: No Typology Code available for payment source | Admitting: Family Medicine

## 2021-09-17 ENCOUNTER — Encounter: Payer: Self-pay | Admitting: Family Medicine

## 2021-09-17 ENCOUNTER — Other Ambulatory Visit: Payer: Self-pay

## 2021-09-17 VITALS — BP 116/78 | HR 86 | Temp 98.5°F | Resp 18 | Ht 65.0 in | Wt 315.0 lb

## 2021-09-17 DIAGNOSIS — R0602 Shortness of breath: Secondary | ICD-10-CM | POA: Insufficient documentation

## 2021-09-17 NOTE — Patient Instructions (Signed)
It was great to see you!  Our plans for today:  - Continue the Breo. It can take a few weeks to see effect from this. You can take albuterol as needed with this also.  - We are checking some labs today, we will release these results to your MyChart. - Come back in about a month for recheck.   Take care and seek immediate care sooner if you develop any concerns.   Dr. Ky Barban

## 2021-09-17 NOTE — Assessment & Plan Note (Signed)
Likely 2/2 asthma exacerbation given improvement with steroids and duonebs in ED. CXR negative for PNA or edema at that time but with low lung volumes. Does have h/o OSA but without CPAP and with some PND, likely contributing. Able to maintain saturations with ambulation in office today, EKG unremarkable in ED and no report of leg swelling or pain, no edema on exam today, unlikely PE but with patient concern and FH of PE, will obtain d dimer. Will also obtain BNP given PND and orthopnea. Check CMP and CBC for other contributors. Will repeat sleep study. Continue breo and prn albuterol, f/u in 1 month.

## 2021-09-17 NOTE — Progress Notes (Signed)
   SUBJECTIVE:   CHIEF COMPLAINT / HPI:   [facility]y [facility] - seen previously 10/26 in ED. Given dexamethasone and duonebs for asthma exacerbation with improvement. CXR clear, EKG unremarkable. Symptoms returned the next day. - Seen 10/27 in office for f/u, gave prednisone and symbicort x1 mth with Breo samples.  - taking Breo, not noticing much difference - first asthma attack since high school. 15 years.  - has albuterol, but hasn't used.   Cough: nonproductive cough Chest tightness: yes Wheezing:  doesn't think so Fevers: no Chest pain:  a little, with deep breathing Palpitations: no  Nausea: no Status: stable Aggravating factors: movement Alleviating factors: not talking, rest Treatments attempted: 0 Some PND with naps. Increased orthopnea recently. No known FH of cardiac disease Has h/o OSA, does not have CPAP, previous insurance would not cover.   OBJECTIVE:   BP 116/78   Pulse 86   Temp 98.5 F (36.9 C)   Resp 18   Ht 5\' 5"  (1.651 m)   Wt (!) 315 lb (142.9 kg)   SpO2 97%   BMI 52.42 kg/m   Gen: well appearing, obese, in NAD Card: RRR Lungs: CTAB. No wheeze, rales, rhonchi Ext: WWP, no edema, negative Homan's.    ASSESSMENT/PLAN:   Shortness of breath Likely 2/2 asthma exacerbation given improvement with steroids and duonebs in ED. CXR negative for PNA or edema at that time but with low lung volumes. Does have h/o OSA but without CPAP and with some PND, likely contributing. Able to maintain saturations with ambulation in office today, EKG unremarkable in ED and no report of leg swelling or pain, no edema on exam today, unlikely PE but with patient concern and FH of PE, will obtain d dimer. Will also obtain BNP given PND and orthopnea. Check CMP and CBC for other contributors. Will repeat sleep study. Continue breo and prn albuterol, f/u in 1 month.     Myles Gip, DO

## 2021-09-19 ENCOUNTER — Encounter: Payer: Self-pay | Admitting: Family Medicine

## 2021-09-19 LAB — COMPREHENSIVE METABOLIC PANEL
ALT: 13 IU/L (ref 0–32)
AST: 13 IU/L (ref 0–40)
Albumin/Globulin Ratio: 1.9 (ref 1.2–2.2)
Albumin: 4.1 g/dL (ref 3.8–4.8)
Alkaline Phosphatase: 129 IU/L — ABNORMAL HIGH (ref 44–121)
BUN/Creatinine Ratio: 25 — ABNORMAL HIGH (ref 9–23)
BUN: 23 mg/dL — ABNORMAL HIGH (ref 6–20)
Bilirubin Total: 0.3 mg/dL (ref 0.0–1.2)
CO2: 21 mmol/L (ref 20–29)
Calcium: 8.9 mg/dL (ref 8.7–10.2)
Chloride: 107 mmol/L — ABNORMAL HIGH (ref 96–106)
Creatinine, Ser: 0.91 mg/dL (ref 0.57–1.00)
Globulin, Total: 2.2 g/dL (ref 1.5–4.5)
Glucose: 100 mg/dL — ABNORMAL HIGH (ref 70–99)
Potassium: 4.9 mmol/L (ref 3.5–5.2)
Sodium: 138 mmol/L (ref 134–144)
Total Protein: 6.3 g/dL (ref 6.0–8.5)
eGFR: 86 mL/min/{1.73_m2} (ref >59)

## 2021-09-19 LAB — CBC WITH DIFFERENTIAL/PLATELET
Basophils Absolute: 0.1 10*3/uL (ref 0.0–0.2)
Basos: 1 %
EOS (ABSOLUTE): 0.1 10*3/uL (ref 0.0–0.4)
Eos: 1 %
Hematocrit: 39.6 % (ref 34.0–46.6)
Hemoglobin: 12.9 g/dL (ref 11.1–15.9)
Immature Grans (Abs): 0 10*3/uL (ref 0.0–0.1)
Immature Granulocytes: 0 %
Lymphocytes Absolute: 1.9 10*3/uL (ref 0.7–3.1)
Lymphs: 21 %
MCH: 30.4 pg (ref 26.6–33.0)
MCHC: 32.6 g/dL (ref 31.5–35.7)
MCV: 93 fL (ref 79–97)
Monocytes Absolute: 0.5 10*3/uL (ref 0.1–0.9)
Monocytes: 5 %
Neutrophils Absolute: 6.5 10*3/uL (ref 1.4–7.0)
Neutrophils: 72 %
Platelets: 230 10*3/uL (ref 150–450)
RBC: 4.25 x10E6/uL (ref 3.77–5.28)
RDW: 12.4 % (ref 11.7–15.4)
WBC: 9.1 10*3/uL (ref 3.4–10.8)

## 2021-09-19 LAB — D-DIMER, QUANTITATIVE: D-DIMER: 0.33 mg/L FEU (ref 0.00–0.49)

## 2021-09-19 LAB — BRAIN NATRIURETIC PEPTIDE: BNP: 18.2 pg/mL (ref 0.0–100.0)

## 2021-10-16 NOTE — Progress Notes (Signed)
Name: Tracey Perry   MRN: [id number]    DOB: Jun 16, 1991   Date:10/17/2021       Progress Note  Subjective  Chief Complaint  Follow Up for shortness of breath  HPI  States SOB has improved and she is only having mild episodes 1-2 times per week at this time. These episodes are mainly characterized by mild difficulty catching her breath while wearing a mask and talking but resolves within 5 minutes. She has used her rescue inhaler a few times this week but frequency is decreasing. Denies prolonged SOB, chest tightness, wheezing, cough.   She is taking Symbicort and feels that it is still effective at managing symptoms but worried about the intermittent SOB that is still present . Discussed referral to pulmonologist or try a one month trial of Trelegy in place of Symbicort and she prefers trying Trelegy first.   Requests refill of Ventolin HFA and Symbicort to last until next physical scheduled in May 2023.  Discussed sleep and OSA- patient states she will pursue sleep study at the start of the new year with better insurance.     Patient Active Problem List   Diagnosis Date Noted   Shortness of breath 09/17/2021   Lower abdominal pain 02/06/2021   Anxiety 12/29/2020   Cervical spondylosis with myelopathy and radiculopathy 12/11/2020   Cervical stenosis of spinal canal 12/11/2020   Asthma 12/06/2020   Chronic pain 12/06/2020   Cervical spinal stenosis 05/05/2020   Bulging of cervical intervertebral disc 05/05/2020   Cervical radiculopathy 05/05/2020   Bipolar 2 disorder ([place]) 12/15/2018   Fever blister 07/29/2016   Menorrhagia with irregular cycle 06/20/2016   OSA (obstructive sleep apnea) 05/11/2016   Hypersomnia 12/25/2015   Snoring 12/25/2015   Cluster B personality disorder ([place]) 10/27/2015   H/O urinary tract infection 06/21/2015   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   Depression, major, recurrent, moderate ([place]) 06/21/2015   Gastroparesis 06/21/2015   Hx of cold sores  06/21/2015   Generalized anxiety disorder 05/31/2015   History of pyelonephritis 05/30/2015   Dysmenorrhea 05/30/2015   History of asthma 05/30/2015   Migraine without aura and responsive to treatment 05/30/2015   Morbid obesity, unspecified obesity type ([place]) 05/30/2015   Allergic rhinitis, seasonal 05/30/2015   Vertigo 05/30/2015   IBS (irritable bowel syndrome) 09/20/2013   H/O cervical spine surgery 12/29/2012   Vitamin D deficiency 11/23/2009   History of mononucleosis 04/03/2009    Past Surgical History:  Procedure Laterality Date   ADENOIDECTOMY     anterior cervical decompression  Left 12/12/2020   revision with removal of hardware and decompression  - done at [place] N/A 04/30/2021   Procedure: COLONOSCOPY WITH PROPOFOL;  Surgeon: Lin Landsman, MD;  Location: [facility] ENDOSCOPY;  Service: Gastroenterology;  Laterality: N/A;   TONSILLECTOMY     TYMPANOSTOMY TUBE PLACEMENT      Family History  Problem Relation Age of Onset   Depression Mother    Hypertension Mother        controlled   Allergic rhinitis Mother    Anxiety disorder Mother    Diabetes Father        controlled   Hypertension Father    Allergic rhinitis Father    Depression Father    Depression Brother    Anxiety disorder Brother     Social History   Tobacco Use   Smoking status: Never   Smokeless  tobacco: Never  Substance Use Topics   Alcohol use: Not Currently    Alcohol/week: 0.0 standard drinks     Current Outpatient Medications:    amitriptyline (ELAVIL) 25 MG tablet, Take 25 mg by mouth at bedtime., Disp: , Rfl:    budesonide-formoterol (SYMBICORT) 160-4.5 MCG/ACT inhaler, Inhale 2 puffs into the lungs 2 (two) times daily., Disp: 3 each, Rfl: 3   clonazePAM (KLONOPIN) 0.5 MG tablet, Take 1 tablet (0.5 mg total) by mouth 2 (two) times daily., Disp: 60 tablet, Rfl: 4   famotidine (PEPCID) 10 MG tablet, Take by mouth., Disp:  , Rfl:    fluticasone (FLONASE) 50 MCG/ACT nasal spray, Place into both nostrils daily., Disp: , Rfl:    indomethacin (INDOCIN) 25 MG capsule, Take 1 tablet PO Q8H x 2 weeks, the 2 PO Q8H x 2 weeks, then 3 PO Q8H. DO NOT CRUSH. TAKE WITH FOOD., Disp: , Rfl:    lamoTRIgine (LAMICTAL) 150 MG tablet, Take 150 mg by mouth 2 (two) times daily., Disp: , Rfl:    levocetirizine (XYZAL) 5 MG tablet, Take by mouth., Disp: , Rfl:    methocarbamol (ROBAXIN) 500 MG tablet, Take by mouth., Disp: , Rfl:    montelukast (SINGULAIR) 10 MG tablet, Take 1 tablet (10 mg total) by mouth every evening., Disp: 90 tablet, Rfl: 1   norethindrone (AYGESTIN) 5 MG tablet, Take 1 tablet (5 mg total) by mouth daily., Disp: 28 tablet, Rfl: 0   ondansetron (ZOFRAN) 4 MG tablet, TAKE 1 TABLET BY MOUTH EVERY 8 HOURS AS NEEDED FOR NAUSEA AND VOMITING, Disp: 20 tablet, Rfl: 0   sertraline (ZOLOFT) 100 MG tablet, Take 1 tablet by mouth daily., Disp: , Rfl:    valACYclovir (VALTREX) 500 MG tablet, Take 500 mg by mouth daily., Disp: , Rfl:    VENTOLIN HFA 108 (90 Base) MCG/ACT inhaler, TAKE 2 PUFFS BY MOUTH EVERY 6 HOURS AS NEEDED FOR WHEEZE OR SHORTNESS OF BREATH, Disp: 18 g, Rfl: 0   dicyclomine (BENTYL) 10 MG capsule, Take 1 capsule (10 mg total) by mouth 3 (three) times daily before meals. (Patient not taking: Reported on 10/17/2021), Disp: 30 capsule, Rfl: 0   SUMAtriptan (IMITREX) 100 MG tablet, Take 1 tablet by mouth once as needed for migraine. May repeat in 2 hours if needed. DO NOT EXCEED 2 pills/day, 4 pills/week, 8 pills/month. (Patient not taking: Reported on 10/17/2021), Disp: , Rfl:   Allergies  Allergen Reactions   Prednisone Other (See Comments) and Palpitations    Severe mood swings and tachycardia   Amoxicillin-Pot Clavulanate Other (See Comments)    Mood swings Mood swings Mood swings    I personally reviewed active problem list, medication list, allergies, family history, social history, health maintenance  with the patient/caregiver today.   Review of Systems  Constitutional:  Negative for chills, diaphoresis and fever.  Eyes:  Negative for blurred vision and double vision.  Respiratory:  Positive for shortness of breath (Mild intermittent SOB that resolves <65mn). Negative for cough and wheezing.   Cardiovascular:  Negative for chest pain, palpitations and leg swelling.  Neurological:  Negative for dizziness.     Objective  Vitals:   10/17/21 1117  BP: 120/70  Pulse: 85  Resp: 16  Temp: 98.3 F (36.8 C)  SpO2: 99%  Weight: (!) 323 lb (146.5 kg)  Height: '5\' 5"'  (1.651 m)    Body mass index is 53.75 kg/m.  Physical Exam Constitutional:      Appearance: Normal appearance.  HENT:     Head: Normocephalic.  Cardiovascular:     Rate and Rhythm: Normal rate and regular rhythm.     Pulses: Normal pulses.     Heart sounds: Normal heart sounds.  Pulmonary:     Effort: Pulmonary effort is normal.     Breath sounds: Normal breath sounds.  Musculoskeletal:     Cervical back: Normal range of motion and neck supple.  Neurological:     Mental Status: She is alert and oriented to person, place, and time.  Psychiatric:        Mood and Affect: Mood normal.        Behavior: Behavior normal.        Thought Content: Thought content normal.     Recent Results (from the past 2160 hour(s))  B Nat Peptide     Status: None   Collection Time: 09/17/21  9:48 AM  Result Value Ref Range   BNP 18.2 0.0 - 100.0 pg/mL  CBC with Differential     Status: None   Collection Time: 09/17/21  9:48 AM  Result Value Ref Range   WBC 9.1 3.4 - 10.8 x10E3/uL   RBC 4.25 3.77 - 5.28 x10E6/uL   Hemoglobin 12.9 11.1 - 15.9 g/dL   Hematocrit 39.6 34.0 - 46.6 %   MCV 93 79 - 97 fL   MCH 30.4 26.6 - 33.0 pg   MCHC 32.6 31.5 - 35.7 g/dL   RDW 12.4 11.7 - 15.4 %   Platelets 230 150 - 450 x10E3/uL   Neutrophils 72 Not Estab. %   Lymphs 21 Not Estab. %   Monocytes 5 Not Estab. %   Eos 1 Not Estab. %    Basos 1 Not Estab. %   Neutrophils Absolute 6.5 1.4 - 7.0 x10E3/uL   Lymphocytes Absolute 1.9 0.7 - 3.1 x10E3/uL   Monocytes Absolute 0.5 0.1 - 0.9 x10E3/uL   EOS (ABSOLUTE) 0.1 0.0 - 0.4 x10E3/uL   Basophils Absolute 0.1 0.0 - 0.2 x10E3/uL   Immature Granulocytes 0 Not Estab. %   Immature Grans (Abs) 0.0 0.0 - 0.1 x10E3/uL  D-Dimer, Quantitative     Status: None   Collection Time: 09/17/21  9:48 AM  Result Value Ref Range   D-DIMER 0.33 0.00 - 0.49 mg/L FEU    Comment: According to the assay manufacturer's published package insert, a normal (<0.50 mg/L FEU) D-dimer result in conjunction with a non-high clinical probability assessment, excludes deep vein thrombosis (DVT) and pulmonary embolism (PE) with high sensitivity. D-dimer values increase with age and this can make VTE exclusion of an older population difficult. To address this, the [facility], based on best available evidence and recent guidelines, recommends that clinicians use age-adjusted D-dimer thresholds in patients greater than 26 years of age with: a) a low probability of PE who do not meet all Pulmonary Embolism Rule Out Criteria, or b) in those with intermediate probability of PE. The formula for an age-adjusted D-dimer cut-off is "age/100". For example, a 32 year old patient would have an age-adjusted cut-off of 0.60 mg/L FEU and an 32 year old 0.80 mg/L FEU.   Comprehensive Metabolic Panel (CMET)     Status: Abnormal   Collection Time: 09/17/21  9:48 AM  Result Value Ref Range   Glucose 100 (H) 70 - 99 mg/dL   BUN 23 (H) 6 - 20 mg/dL   Creatinine, Ser 0.91 0.57 - 1.00 mg/dL   eGFR 86 >59 mL/min/1.73   BUN/Creatinine Ratio  25 (H) 9 - 23   Sodium 138 134 - 144 mmol/L   Potassium 4.9 3.5 - 5.2 mmol/L   Chloride 107 (H) 96 - 106 mmol/L   CO2 21 20 - 29 mmol/L   Calcium 8.9 8.7 - 10.2 mg/dL   Total Protein 6.3 6.0 - 8.5 g/dL   Albumin 4.1 3.8 - 4.8 g/dL   Globulin, Total 2.2 1.5 - 4.5 g/dL    Albumin/Globulin Ratio 1.9 1.2 - 2.2   Bilirubin Total 0.3 0.0 - 1.2 mg/dL   Alkaline Phosphatase 129 (H) 44 - 121 IU/L   AST 13 0 - 40 IU/L   ALT 13 0 - 32 IU/L    PHQ2/9: Depression screen [facility] 2/9 10/17/2021 09/17/2021 09/13/2021 03/14/2021 02/06/2021  Decreased Interest 0 0 '2 2 2  ' Down, Depressed, Hopeless 0 0 '1 2 1  ' PHQ - 2 Score 0 0 '3 4 3  ' Altered sleeping 0 0 1 0 3  Tired, decreased energy 0 0 '1 3 3  ' Change in appetite 0 0 0 3 2  Feeling bad or failure about yourself  0 0 '1 1 1  ' Trouble concentrating 0 0 0 3 1  Moving slowly or fidgety/restless 0 0 0 0 0  Suicidal thoughts 0 0 0 0 1  PHQ-9 Score 0 0 '6 14 14  ' Difficult doing work/chores - Not difficult at all - - Somewhat difficult  Some recent data might be hidden    phq 9 is negative   Fall Risk: Fall Risk  10/17/2021 09/17/2021 09/13/2021 03/14/2021 02/06/2021  Falls in the past year? 0 0 1 0 1  Number falls in past yr: 0 0 1 0 1  Injury with Fall? 0 0 0 0 1  Risk for fall due to : No Fall Risks - No Fall Risks - -  Follow up Falls prevention discussed - Falls prevention discussed - -      Functional Status Survey: Is the patient deaf or have difficulty hearing?: No Does the patient have difficulty seeing, even when wearing glasses/contacts?: No Does the patient have difficulty concentrating, remembering, or making decisions?: No Does the patient have difficulty walking or climbing stairs?: No Does the patient have difficulty dressing or bathing?: No Does the patient have difficulty doing errands alone such as visiting a doctor's office or shopping?: No    Assessment & Plan  Problem List Items Addressed This Visit       Respiratory   Asthma    Symptoms improving- rescue inhaler use is decreasing to 2-3 times per week Patient is using Symbicort daily and report it is beneficial.  Refill for Symbicort and Ventolin placed to tide her over until Physical in May 2023 Discussed return precautions and when to  go to ED for flare.        Gave sample and trial of Trelegy in place of Symbicort to see if SOB resolves and if symptoms persists we will refer to Pulmonologist

## 2021-10-17 ENCOUNTER — Encounter: Payer: Self-pay | Admitting: Family Medicine

## 2021-10-17 ENCOUNTER — Ambulatory Visit: Payer: No Typology Code available for payment source | Admitting: Family Medicine

## 2021-10-17 ENCOUNTER — Other Ambulatory Visit: Payer: Self-pay

## 2021-10-17 DIAGNOSIS — J4520 Mild intermittent asthma, uncomplicated: Secondary | ICD-10-CM | POA: Diagnosis not present

## 2021-10-17 MED ORDER — TRELEGY ELLIPTA 100-62.5-25 MCG/ACT IN AEPB: 1.0000 | INHALATION_SPRAY | Freq: Every day | RESPIRATORY_TRACT | 0 refills | Status: DC

## 2021-10-17 MED ORDER — ALBUTEROL SULFATE HFA 108 (90 BASE) MCG/ACT IN AERS: 2.0000 | INHALATION_SPRAY | RESPIRATORY_TRACT | 0 refills | Status: DC | PRN

## 2021-10-17 NOTE — Assessment & Plan Note (Addendum)
Symptoms improving- rescue inhaler use is decreasing to 2-3 times per week Patient is using Symbicort daily and report it is beneficial.  Refill  Ventolin placed to tide her over until Physical in May 2023 Provided 30 day sample of Trelegy to provide further relief from flare.  Restart Symbicort once finished with Trelegy.  Discussed return precautions and when to go to ED for flare.

## 2021-10-17 NOTE — Patient Instructions (Signed)
Start Trelegy to assist with flare Restart Symbicort once Trelegy is finished.  Provided refill of rescue inhaler Call for refill of Symbicort when needed.

## 2021-10-26 ENCOUNTER — Encounter: Payer: Self-pay | Admitting: Family Medicine

## 2021-10-26 ENCOUNTER — Ambulatory Visit (INDEPENDENT_AMBULATORY_CARE_PROVIDER_SITE_OTHER): Payer: No Typology Code available for payment source | Admitting: Family Medicine

## 2021-10-26 ENCOUNTER — Ambulatory Visit: Payer: Self-pay

## 2021-10-26 VITALS — BP 128/70 | HR 98 | Temp 98.3°F | Resp 16 | Ht 65.0 in | Wt 324.1 lb

## 2021-10-26 DIAGNOSIS — R5383 Other fatigue: Secondary | ICD-10-CM | POA: Diagnosis not present

## 2021-10-26 DIAGNOSIS — G4733 Obstructive sleep apnea (adult) (pediatric): Secondary | ICD-10-CM

## 2021-10-26 DIAGNOSIS — R350 Frequency of micturition: Secondary | ICD-10-CM

## 2021-10-26 DIAGNOSIS — F3131 Bipolar disorder, current episode depressed, mild: Secondary | ICD-10-CM

## 2021-10-26 DIAGNOSIS — Z79899 Other long term (current) drug therapy: Secondary | ICD-10-CM

## 2021-10-26 DIAGNOSIS — F338 Other recurrent depressive disorders: Secondary | ICD-10-CM

## 2021-10-26 LAB — POCT URINALYSIS DIPSTICK (MANUAL)
Nitrite, UA: NEGATIVE
Poct Bilirubin: NEGATIVE
Poct Glucose: NORMAL mg/dL
Poct Protein: NEGATIVE mg/dL
Poct Urobilinogen: NORMAL mg/dL
Spec Grav, UA: 1.015 (ref 1.010–1.025)
pH, UA: 6.5 (ref 5.0–8.0)

## 2021-10-26 LAB — POCT URINE PREGNANCY: Preg Test, Ur: NEGATIVE

## 2021-10-26 MED ORDER — MODAFINIL 100 MG PO TABS: 100.0000 mg | ORAL_TABLET | Freq: Every day | ORAL | 0 refills | Status: DC

## 2021-10-26 NOTE — Telephone Encounter (Signed)
  Chief Complaint: fatigue Symptoms: extreme fatigue, increased thirst and urination Frequency: 5 days Pertinent Negatives: Patient denies any other symptoms Disposition: [] ED /[] Urgent Care (no appt availability in office) / [x] Appointment(In office/virtual)/ []  [place] [facility]/ [] Home Care/ [] Refused Recommended Disposition  Additional Notes: Pt states she fell asleep on the phone taking to a pt this morning and is concerned about her being extremely fatigued. Appt scheduled for today at 1420 with Dr. Ancil Boozer.    Message from Jodie Echevaria sent at 10/26/2021 10:14 AM EST  Patient called in trying to get an appointment with Dr Ancil Boozer or any provider for extreme fatigue say that she fell asleep talking to someone and had to leave work. Please advise Ph# [phone]    Reason for Disposition  [1] MODERATE weakness (i.e., interferes with work, school, normal activities) AND [2] cause unknown  (Exceptions: weakness with acute minor illness, or weakness from poor fluid intake)  Answer Assessment - Initial Assessment Questions 1. DESCRIPTION: "Describe how you are feeling."     Could lay on floor and go to sleep 2. SEVERITY: "How bad is it?"  "Can you stand and walk?"   - MILD - Feels weak or tired, but does not interfere with work, school or normal activities   - [place] to stand and walk; weakness interferes with work, school, or normal activities   - SEVERE - Unable to stand or walk     Moderate and severe  3. ONSET:  "When did the weakness begin?"     Since Monday 4. CAUSE: "What do you think is causing the weakness?"     unsure 5. MEDICINES: "Have you recently started a new medicine or had a change in the amount of a medicine?"     Inhaler change  6. OTHER SYMPTOMS: "Do you have any other symptoms?" (e.g., chest pain, fever, cough, SOB, vomiting, diarrhea, bleeding, other areas of pain)     Thirsty cant get enough fluids  7. PREGNANCY: "Is there any chance you are  pregnant?" "When was your last menstrual period?"     No  Protocols used: Weakness (Generalized) and Fatigue-A-AH

## 2021-10-26 NOTE — Progress Notes (Signed)
Name: Tracey Perry   MRN: [id number]    DOB: 09-Mar-1991   Date:10/26/2021       Progress Note  Subjective  Chief Complaint  Acute visit: increased fatigue, thirst, and urination   HPI  Fatigue: she was seen by Dr. Ky Barban one month ago. At the time she was feeling very tired. She had some labs done and it was unremarkable. She states she has been getting progressively worse. She sleeps almost the entire weekend. During week days she has been sleeping at least 8 hours and wakes up feeling tired , this week she even fell asleep while talking to a patient over the phone. She has also noticed urinary frequency but no dysuria. She feels thirsty but last glucose was only slightly above goal. She states SOB is stable - likely multifactorial  She snores and ESS today was 17. She had a positive sleep study with desaturation to 88 % in 2017 but at the time could not afford going back for titration. Explained we will start her on medication for energy ( provigil) but it is to prevent her from getting in a car accident or sleeping while at work but she must go back for titration study to correct the main problem. We also discussed possibility of SAD and she will follow up with her psychiatrist. She states her bipolar disorder has been stable. She took stimulants in the past but it caused her to have tachycardia and go into manic episode. Advised to start modafinil on the weekend and to be at mother's house to see if she develop symptoms of mania. Stop medication if that occurs   Mobid obesity: discussed increasing physical activity that will help with energy and sun exposure can help with SAD  Patient Active Problem List   Diagnosis Date Noted   Shortness of breath 09/17/2021   Episodic tension-type headache, not intractable 04/25/2021   Idiopathic stabbing headache 04/25/2021   Lower abdominal pain 02/06/2021   Anxiety 12/29/2020   Cervical spondylosis with myelopathy and radiculopathy 12/11/2020    Cervical stenosis of spinal canal 12/11/2020   Asthma exacerbation, mild 12/06/2020   Chronic pain 12/06/2020   Cervical spinal stenosis 05/05/2020   Bulging of cervical intervertebral disc 05/05/2020   Cervical radiculopathy 05/05/2020   Bipolar 2 disorder ([place]) 12/15/2018   Fever blister 07/29/2016   Menorrhagia with irregular cycle 06/20/2016   OSA (obstructive sleep apnea) 05/11/2016   Hypersomnia 12/25/2015   Snoring 12/25/2015   Cluster B personality disorder ([place]) 10/27/2015   H/O urinary tract infection 06/21/2015   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   Depression, major, recurrent, moderate ([place]) 06/21/2015   Gastroparesis 06/21/2015   Hx of cold sores 06/21/2015   Generalized anxiety disorder 05/31/2015   History of pyelonephritis 05/30/2015   Dysmenorrhea 05/30/2015   History of asthma 05/30/2015   Migraine without aura and responsive to treatment 05/30/2015   Morbid obesity, unspecified obesity type ([place]) 05/30/2015   Allergic rhinitis, seasonal 05/30/2015   Vertigo 05/30/2015   IBS (irritable bowel syndrome) 09/20/2013   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   H/O cervical spine surgery 12/29/2012   Vitamin D deficiency 11/23/2009   History of mononucleosis 04/03/2009    Past Surgical History:  Procedure Laterality Date   ADENOIDECTOMY     anterior cervical decompression  Left 12/12/2020   revision with removal of hardware and decompression  - done at [place] N/A 04/30/2021  Procedure: COLONOSCOPY WITH PROPOFOL;  Surgeon: Lin Landsman, MD;  Location: [facility] ENDOSCOPY;  Service: Gastroenterology;  Laterality: N/A;   TONSILLECTOMY     TYMPANOSTOMY TUBE PLACEMENT      Family History  Problem Relation Age of Onset   Depression Mother    Hypertension Mother        controlled   Allergic rhinitis Mother    Anxiety disorder Mother    Diabetes Father        controlled   Hypertension Father     Allergic rhinitis Father    Depression Father    Depression Brother    Anxiety disorder Brother     Social History   Tobacco Use   Smoking status: Never   Smokeless tobacco: Never  Substance Use Topics   Alcohol use: Not Currently    Alcohol/week: 0.0 standard drinks     Current Outpatient Medications:    albuterol (VENTOLIN HFA) 108 (90 Base) MCG/ACT inhaler, Inhale 2 puffs into the lungs every 4 (four) hours as needed for wheezing or shortness of breath., Disp: 18 g, Rfl: 0   amitriptyline (ELAVIL) 25 MG tablet, Take 25 mg by mouth at bedtime., Disp: , Rfl:    budesonide-formoterol (SYMBICORT) 160-4.5 MCG/ACT inhaler, Inhale 2 puffs into the lungs 2 (two) times daily., Disp: 3 each, Rfl: 3   clonazePAM (KLONOPIN) 0.5 MG tablet, Take 1 tablet (0.5 mg total) by mouth 2 (two) times daily., Disp: 60 tablet, Rfl: 4   famotidine (PEPCID) 10 MG tablet, Take by mouth., Disp: , Rfl:    fluticasone (FLONASE) 50 MCG/ACT nasal spray, Place into both nostrils daily., Disp: , Rfl:    indomethacin (INDOCIN) 25 MG capsule, Take 1 tablet PO Q8H x 2 weeks, the 2 PO Q8H x 2 weeks, then 3 PO Q8H. DO NOT CRUSH. TAKE WITH FOOD., Disp: , Rfl:    lamoTRIgine (LAMICTAL) 150 MG tablet, Take 150 mg by mouth 2 (two) times daily., Disp: , Rfl:    levocetirizine (XYZAL) 5 MG tablet, Take by mouth., Disp: , Rfl:    modafinil (PROVIGIL) 100 MG tablet, Take 1 tablet (100 mg total) by mouth daily., Disp: 30 tablet, Rfl: 0   montelukast (SINGULAIR) 10 MG tablet, Take 1 tablet (10 mg total) by mouth every evening., Disp: 90 tablet, Rfl: 1   norethindrone (AYGESTIN) 5 MG tablet, Take 1 tablet (5 mg total) by mouth daily., Disp: 28 tablet, Rfl: 0   sertraline (ZOLOFT) 100 MG tablet, Take 1 tablet by mouth daily., Disp: , Rfl:    SUMAtriptan (IMITREX) 100 MG tablet, , Disp: , Rfl:    valACYclovir (VALTREX) 500 MG tablet, Take 500 mg by mouth daily., Disp: , Rfl:   Allergies  Allergen Reactions   Prednisone Other (See  Comments) and Palpitations    Severe mood swings and tachycardia   Amoxicillin-Pot Clavulanate Other (See Comments)    Mood swings Mood swings Mood swings    I personally reviewed active problem list, medication list, allergies, family history, social history with the patient/caregiver today.   ROS  Constitutional: Negative for fever or weight change.  Respiratory: Negative for cough and shortness of breath.   Cardiovascular: Negative for chest pain or palpitations.  Gastrointestinal: Negative for abdominal pain, no bowel changes.  Musculoskeletal: Negative for gait problem or joint swelling.  Skin: Negative for rash.  Neurological: Negative for dizziness, positive for intermittent  headache.  No other specific complaints in a complete review of systems (except as listed in HPI  above).   Objective  Vitals:   10/26/21 1421  BP: 128/70  Pulse: 98  Resp: 16  Temp: 98.3 F (36.8 C)  TempSrc: Oral  SpO2: 99%  Weight: (!) 324 lb 1.6 oz (147 kg)  Height: '5\' 5"'  (1.651 m)    Body mass index is 53.93 kg/m.  Physical Exam  Constitutional: Patient appears well-developed and well-nourished. Obese  No distress.  HEENT: head atraumatic, normocephalic, pupils equal and reactive to light, neck supple Cardiovascular: Normal rate, regular rhythm and normal heart sounds.  No murmur heard. No BLE edema. Pulmonary/Chest: Effort normal and breath sounds normal. No respiratory distress. Abdominal: Soft.  There is no tenderness. Psychiatric: Patient has a normal mood and affect. behavior is normal. Judgment and thought content normal.   Recent Results (from the past 2160 hour(s))  B Nat Peptide     Status: None   Collection Time: 09/17/21  9:48 AM  Result Value Ref Range   BNP 18.2 0.0 - 100.0 pg/mL  CBC with Differential     Status: None   Collection Time: 09/17/21  9:48 AM  Result Value Ref Range   WBC 9.1 3.4 - 10.8 x10E3/uL   RBC 4.25 3.77 - 5.28 x10E6/uL   Hemoglobin 12.9 11.1  - 15.9 g/dL   Hematocrit 39.6 34.0 - 46.6 %   MCV 93 79 - 97 fL   MCH 30.4 26.6 - 33.0 pg   MCHC 32.6 31.5 - 35.7 g/dL   RDW 12.4 11.7 - 15.4 %   Platelets 230 150 - 450 x10E3/uL   Neutrophils 72 Not Estab. %   Lymphs 21 Not Estab. %   Monocytes 5 Not Estab. %   Eos 1 Not Estab. %   Basos 1 Not Estab. %   Neutrophils Absolute 6.5 1.4 - 7.0 x10E3/uL   Lymphocytes Absolute 1.9 0.7 - 3.1 x10E3/uL   Monocytes Absolute 0.5 0.1 - 0.9 x10E3/uL   EOS (ABSOLUTE) 0.1 0.0 - 0.4 x10E3/uL   Basophils Absolute 0.1 0.0 - 0.2 x10E3/uL   Immature Granulocytes 0 Not Estab. %   Immature Grans (Abs) 0.0 0.0 - 0.1 x10E3/uL  D-Dimer, Quantitative     Status: None   Collection Time: 09/17/21  9:48 AM  Result Value Ref Range   D-DIMER 0.33 0.00 - 0.49 mg/L FEU    Comment: According to the assay manufacturer's published package insert, a normal (<0.50 mg/L FEU) D-dimer result in conjunction with a non-high clinical probability assessment, excludes deep vein thrombosis (DVT) and pulmonary embolism (PE) with high sensitivity. D-dimer values increase with age and this can make VTE exclusion of an older population difficult. To address this, the [place], based on best available evidence and recent guidelines, recommends that clinicians use age-adjusted D-dimer thresholds in patients greater than 70 years of age with: a) a low probability of PE who do not meet all Pulmonary Embolism Rule Out Criteria, or b) in those with intermediate probability of PE. The formula for an age-adjusted D-dimer cut-off is "age/100". For example, a 32 year old patient would have an age-adjusted cut-off of 0.60 mg/L FEU and an 32 year old 0.80 mg/L FEU.   Comprehensive Metabolic Panel (CMET)     Status: Abnormal   Collection Time: 09/17/21  9:48 AM  Result Value Ref Range   Glucose 100 (H) 70 - 99 mg/dL   BUN 23 (H) 6 - 20 mg/dL   Creatinine, Ser 0.91 0.57 - 1.00 mg/dL   eGFR 86 >59 mL/min/1.73  BUN/Creatinine Ratio 25 (H) 9 - 23   Sodium 138 134 - 144 mmol/L   Potassium 4.9 3.5 - 5.2 mmol/L   Chloride 107 (H) 96 - 106 mmol/L   CO2 21 20 - 29 mmol/L   Calcium 8.9 8.7 - 10.2 mg/dL   Total Protein 6.3 6.0 - 8.5 g/dL   Albumin 4.1 3.8 - 4.8 g/dL   Globulin, Total 2.2 1.5 - 4.5 g/dL   Albumin/Globulin Ratio 1.9 1.2 - 2.2   Bilirubin Total 0.3 0.0 - 1.2 mg/dL   Alkaline Phosphatase 129 (H) 44 - 121 IU/L   AST 13 0 - 40 IU/L   ALT 13 0 - 32 IU/L  POCT urine pregnancy     Status: Normal   Collection Time: 10/26/21  2:21 PM  Result Value Ref Range   Preg Test, Ur Negative Negative  POCT Urinalysis Dip Manual     Status: Abnormal   Collection Time: 10/26/21  2:22 PM  Result Value Ref Range   Spec Grav, UA 1.015 1.010 - 1.025   pH, UA 6.5 5.0 - 8.0   Leukocytes, UA Trace (A) Negative   Nitrite, UA Negative Negative   Poct Protein Negative Negative, trace mg/dL   Poct Glucose Normal Normal mg/dL   Poct Ketones + small (A) Negative   Poct Urobilinogen Normal Normal mg/dL   Poct Bilirubin Negative Negative   Poct Blood trace Negative, trace     PHQ2/9: Depression screen [facility] 2/9 10/26/2021 10/17/2021 09/17/2021 09/13/2021 03/14/2021  Decreased Interest 2 0 0 2 2  Down, Depressed, Hopeless 1 0 0 1 2  PHQ - 2 Score 3 0 0 3 4  Altered sleeping 0 0 0 1 0  Tired, decreased energy 3 0 0 1 3  Change in appetite 0 0 0 0 3  Feeling bad or failure about yourself  0 0 0 1 1  Trouble concentrating 0 0 0 0 3  Moving slowly or fidgety/restless 0 0 0 0 0  Suicidal thoughts 0 0 0 0 0  PHQ-9 Score 6 0 0 6 14  Difficult doing work/chores Not difficult at all - Not difficult at all - -  Some recent data might be hidden    phq 9 is positive   Fall Risk: Fall Risk  10/26/2021 10/17/2021 09/17/2021 09/13/2021 03/14/2021  Falls in the past year? 0 0 0 1 0  Number falls in past yr: 0 0 0 1 0  Injury with Fall? 0 0 0 0 0  Risk for fall due to : No Fall Risks No Fall Risks - No Fall Risks -   Follow up Falls prevention discussed Falls prevention discussed - Falls prevention discussed -      Functional Status Survey: Is the patient deaf or have difficulty hearing?: No Does the patient have difficulty seeing, even when wearing glasses/contacts?: No Does the patient have difficulty concentrating, remembering, or making decisions?: Yes Does the patient have difficulty walking or climbing stairs?: No Does the patient have difficulty dressing or bathing?: No Does the patient have difficulty doing errands alone such as visiting a doctor's office or shopping?: No    Assessment & Plan  1. Other fatigue  - TSH - Hemoglobin A1c - COMPLETE METABOLIC PANEL WITH GFR - B12 and Folate Panel - Ambulatory referral to Sleep Studies - modafinil (PROVIGIL) 100 MG tablet; Take 1 tablet (100 mg total) by mouth daily.  Dispense: 30 tablet; Refill: 0  2. Morbid obesity, unspecified obesity type ([place])  -  TSH - Ambulatory referral to Sleep Studies  3. Bipolar affective disorder, currently depressed, mild ([place])   4. Frequent urination  - POCT urine pregnancy - POCT Urinalysis Dip Manual  5. Seasonal affective disorder ([place])  - modafinil (PROVIGIL) 100 MG tablet; Take 1 tablet (100 mg total) by mouth daily.  Dispense: 30 tablet; Refill: 0  6. Long-term use of high-risk medication  - TSH - Hemoglobin A1c - COMPLETE METABOLIC PANEL WITH GFR - B12 and Folate Panel  7. OSA (obstructive sleep apnea)  - Ambulatory referral to Sleep Studies - modafinil (PROVIGIL) 100 MG tablet; Take 1 tablet (100 mg total) by mouth daily.  Dispense: 30 tablet; Refill: 0

## 2021-10-27 LAB — COMPREHENSIVE METABOLIC PANEL
ALT: 15 IU/L (ref 0–32)
AST: 14 IU/L (ref 0–40)
Albumin/Globulin Ratio: 1.6 (ref 1.2–2.2)
Albumin: 4.1 g/dL (ref 3.8–4.8)
Alkaline Phosphatase: 118 IU/L (ref 44–121)
BUN/Creatinine Ratio: 26 — ABNORMAL HIGH (ref 9–23)
BUN: 20 mg/dL (ref 6–20)
Bilirubin Total: <0.2 mg/dL (ref 0.0–1.2)
CO2: 23 mmol/L (ref 20–29)
Calcium: 9.2 mg/dL (ref 8.7–10.2)
Chloride: 105 mmol/L (ref 96–106)
Creatinine, Ser: 0.78 mg/dL (ref 0.57–1.00)
Globulin, Total: 2.5 g/dL (ref 1.5–4.5)
Glucose: 82 mg/dL (ref 70–99)
Potassium: 4.9 mmol/L (ref 3.5–5.2)
Sodium: 140 mmol/L (ref 134–144)
Total Protein: 6.6 g/dL (ref 6.0–8.5)
eGFR: 103 mL/min/{1.73_m2} (ref >59)

## 2021-10-27 LAB — B12 AND FOLATE PANEL
Folate: 5.6 ng/mL (ref >3.0)
Vitamin B-12: 245 pg/mL (ref 232–1245)

## 2021-10-27 LAB — TSH: TSH: 1.63 u[IU]/mL (ref 0.450–4.500)

## 2021-10-27 LAB — HEMOGLOBIN A1C
Est. average glucose Bld gHb Est-mCnc: 111 mg/dL
Hgb A1c MFr Bld: 5.5 % (ref 4.8–5.6)

## 2021-11-16 ENCOUNTER — Encounter: Payer: Self-pay | Admitting: Family Medicine

## 2021-11-22 ENCOUNTER — Other Ambulatory Visit: Payer: Self-pay | Admitting: Family Medicine

## 2021-11-22 DIAGNOSIS — J4520 Mild intermittent asthma, uncomplicated: Secondary | ICD-10-CM

## 2022-01-01 ENCOUNTER — Ambulatory Visit: Payer: No Typology Code available for payment source | Admitting: Family Medicine

## 2022-01-01 ENCOUNTER — Encounter: Payer: Self-pay | Admitting: Family Medicine

## 2022-01-30 ENCOUNTER — Ambulatory Visit: Payer: Self-pay | Admitting: Family Medicine

## 2022-02-08 ENCOUNTER — Ambulatory Visit: Payer: PRIVATE HEALTH INSURANCE | Attending: Neurology

## 2022-02-08 DIAGNOSIS — G4761 Periodic limb movement disorder: Secondary | ICD-10-CM | POA: Diagnosis not present

## 2022-02-08 DIAGNOSIS — Z6841 Body Mass Index (BMI) 40.0 and over, adult: Secondary | ICD-10-CM | POA: Diagnosis not present

## 2022-02-08 DIAGNOSIS — G4733 Obstructive sleep apnea (adult) (pediatric): Secondary | ICD-10-CM | POA: Diagnosis present

## 2022-02-11 ENCOUNTER — Telehealth: Payer: Self-pay

## 2022-02-11 ENCOUNTER — Other Ambulatory Visit: Payer: Self-pay

## 2022-02-11 NOTE — Telephone Encounter (Signed)
Copied from [place] [id number]. Topic: General - Other ?>> Feb 11, 2022  3:18 PM Tracey Perry wrote: ?Reason for CRM: Pt had her sleep study done on Friday March 24th so her results will not be back by her 3.30.23 follow up for the sleep study / pt asked if Dr. Ancil Boozer can call her when the results come in to go over them or another appt is open before May 1st or if Dr. Ancil Boozer is ok with pt seeing another provider for this/ please advise ?>> Feb 11, 2022  3:55 PM Tessa Lerner A wrote: ?The patient has made an additional call to the practice about this concern  ? ?Please contact further when possible  ?

## 2022-02-14 ENCOUNTER — Ambulatory Visit: Payer: Self-pay | Admitting: Family Medicine

## 2022-02-20 NOTE — Progress Notes (Signed)
Name: Tracey Perry   MRN: [id number]    DOB: [birth year]   Date:02/21/2022 ? ?     Progress Note ? ?Subjective ? ?Chief Complaint ? ?Discuss Sleep Study ? ?HPI ? ?OSA: she had initial study in 2017 and qualified for starting therapy however her insurance denied the treatment. She went back for sleep titration study that was done 02/10/2022 and came in today to discuss results however we don't have the report yet. She did not noticed a difference the day after the study. She feels tired constantly, never wakes up feeling rested. She takes a lot of caffeine to stay alert . We tried Provigil and took it for 2 weeks but cause her to feel very anxious and irritability . ? ?Morbid obesity: BMI is above 50, her insurance covers for [facility]. She denies personal history of pancreatitis or family history of MEN disorders. She states she was the most successful with My[employer]itness[employer] to log calories but currently just on portion control and trying to walk during lunch , discussed increasing to 30 minutes 5 days a week. Discussed possible side effects . She has never tried weight loss medications int he past  ? ?Recent viral illness associated with rash on left side of face: went to urgent care, given topical steroids but it is still itchy and red on left side of face, she also has flexural eczema and uses lotion for that.  ? ?Cyst on back: going on for years but starting to bother her , we will refer her to dermatologist  ? ?Herpes type 1: she would like refills of valtrex  ? ?Bipolar disorder: she is feeling much better , coming down on medications and feeling well, sees psychiatrist  ? ?Asthma: she is doing well as long as she uses Symbicort daily, no cough or sob. She is also takes singulair at night.  ? ?Patient Active Problem List  ? Diagnosis Date Noted  ? Shortness of breath 09/17/2021  ? Episodic tension-type headache, not intractable 04/25/2021  ? 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 Lower abdominal pain 02/06/2021   ? Anxiety 12/29/2020  ? Cervical spondylosis with myelopathy and radiculopathy 12/11/2020  ? Cervical stenosis of spinal canal 12/11/2020  ? Asthma exacerbation, mild 12/06/2020  ? Chronic pain 12/06/2020  ? Cervical spinal stenosis 05/05/2020  ? Bulging of cervical intervertebral disc 05/05/2020  ? Cervical radiculopathy 05/05/2020  ? Bipolar 2 disorder ([place]) 12/15/2018  ? Fever blister 07/29/2016  ? Menorrhagia with irregular cycle 06/20/2016  ? OSA (obstructive sleep apnea) 05/11/2016  ? Hypersomnia 12/25/2015  ? Snoring 12/25/2015  ? 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 H/O urinary tract infection 06/21/2015  ? 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 Depression, major, recurrent, moderate ([place]) 06/21/2015  ? Gastroparesis 06/21/2015  ? Hx of cold sores 06/21/2015  ? Generalized anxiety disorder 05/31/2015  ? History of pyelonephritis 05/30/2015  ? Dysmenorrhea 05/30/2015  ? History of asthma 05/30/2015  ? Migraine without aura and responsive to treatment 05/30/2015  ? Morbid obesity, unspecified obesity type ([place]) 05/30/2015  ? Allergic rhinitis, seasonal 05/30/2015  ? Vertigo 05/30/2015  ? IBS (irritable bowel syndrome) 09/20/2013  ? 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  ? H/O cervical spine surgery 12/29/2012  ? Vitamin D deficiency 11/23/2009  ? History of mononucleosis 04/03/2009  ? ? ?Past Surgical History:  ?Procedure Laterality Date  ? ADENOIDECTOMY    ? anterior cervical decompression  Left 12/12/2020  ? revision with removal of hardware and decompression  - done at  [facility]  ? APPENDECTOMY    ? CHOLECYSTECTOMY    ? COLONOSCOPY WITH PROPOFOL N/A 04/30/2021  ? Procedure: COLONOSCOPY WITH PROPOFOL;  Surgeon: Lin Landsman, MD;  Location: [facility] ENDOSCOPY;  Service: Gastroenterology;  Laterality: N/A;  ? TONSILLECTOMY    ? TYMPANOSTOMY TUBE PLACEMENT    ? ? ?Family History  ?Problem Relation Age of Onset  ? Depression Mother   ? Hypertension Mother   ?     controlled  ? Allergic rhinitis Mother   ?  Anxiety disorder Mother   ? Diabetes Father   ?     controlled  ? Hypertension Father   ? Allergic rhinitis Father   ? Depression Father   ? Depression Brother   ? Anxiety disorder Brother   ? ? ?Social History  ? ?Tobacco Use  ? Smoking status: Never  ? Smokeless tobacco: Never  ?Substance Use Topics  ? Alcohol use: Not Currently  ?  Alcohol/week: 0.0 standard drinks  ? ? ? ?Current Outpatient Medications:  ?  albuterol (VENTOLIN HFA) 108 (90 Base) MCG/ACT inhaler, Inhale 2 puffs into the lungs every 4 (four) hours as needed for wheezing or shortness of breath., Disp: 18 g, Rfl: 0 ?  amitriptyline (ELAVIL) 25 MG tablet, Take 25 mg by mouth at bedtime., Disp: , Rfl:  ?  budesonide-formoterol (SYMBICORT) 160-4.5 MCG/ACT inhaler, Inhale 2 puffs into the lungs 2 (two) times daily., Disp: 3 each, Rfl: 3 ?  clonazePAM (KLONOPIN) 0.5 MG tablet, Take 1 tablet (0.5 mg total) by mouth 2 (two) times daily., Disp: 60 tablet, Rfl: 4 ?  famotidine (PEPCID) 10 MG tablet, Take by mouth., Disp: , Rfl:  ?  fluticasone (FLONASE) 50 MCG/ACT nasal spray, Place into both nostrils daily., Disp: , Rfl:  ?  indomethacin (INDOCIN) 25 MG capsule, Take 1 tablet PO Q8H x 2 weeks, the 2 PO Q8H x 2 weeks, then 3 PO Q8H. DO NOT CRUSH. TAKE WITH FOOD., Disp: , Rfl:  ?  lamoTRIgine (LAMICTAL) 150 MG tablet, Take 150 mg by mouth 2 (two) times daily., Disp: , Rfl:  ?  levocetirizine (XYZAL) 5 MG tablet, Take by mouth., Disp: , Rfl:  ?  montelukast (SINGULAIR) 10 MG tablet, Take 1 tablet (10 mg total) by mouth every evening., Disp: 90 tablet, Rfl: 1 ?  norethindrone (AYGESTIN) 5 MG tablet, Take 1 tablet (5 mg total) by mouth daily., Disp: 28 tablet, Rfl: 0 ?  SUMAtriptan (IMITREX) 100 MG tablet, , Disp: , Rfl:  ?  valACYclovir (VALTREX) 500 MG tablet, Take 500 mg by mouth daily., Disp: , Rfl:  ? ?Allergies  ?Allergen Reactions  ? Prednisone Other (See Comments) and Palpitations  ?  Severe mood swings and tachycardia  ? Amoxicillin-Pot Clavulanate  Other (See Comments)  ?  Mood swings ?Mood swings ?Mood swings  ? ? ?I personally reviewed active problem list, medication list, allergies, family history, social history, health maintenance with the patient/caregiver today. ? ? ?ROS ? ?Constitutional: Negative for fever, positive for  weight change.  ?Respiratory: Negative for cough and shortness of breath.   ?Cardiovascular: Negative for chest pain or palpitations.  ?Gastrointestinal: Negative for abdominal pain, no bowel changes.  ?Musculoskeletal: Negative for gait problem or joint swelling.  ?Skin: Positive for  for rash.  ?Neurological: Negative for dizziness , occasional  headache.  ?No other specific complaints in a complete review of systems (except as listed in HPI above).  ? ?Objective ? ?Vitals:  ? 02/21/22 1034  ?BP: 118/68  ?  Pulse: 88  ?Resp: 16  ?SpO2: 98%  ?Weight: (!) 331 lb (150.1 kg)  ?Height: '5\' 5"'$  (1.651 m)  ? ? ?Body mass index is 55.08 kg/m?. ? ?Physical Exam ? ?Constitutional: Patient appears well-developed and well-nourished. Obese  No distress.  ?HEENT: head atraumatic, normocephalic, pupils equal and reactive to light,neck supple ?Cardiovascular: Normal rate, regular rhythm and normal heart sounds.  No murmur heard. No BLE edema. ?Pulmonary/Chest: Effort normal and breath sounds normal. No respiratory distress. ?Abdominal: Soft.  There is no tenderness. ?Psychiatric: Patient has a normal mood and affect. behavior is normal. Judgment and thought content normal.  ? ? ?PHQ2/9: ? ?  02/21/2022  ? 10:33 AM 10/26/2021  ?  2:16 PM 10/17/2021  ? 11:16 AM 09/17/2021  ?  8:54 AM 09/13/2021  ?  2:23 PM  ?Depression screen PHQ 2/9  ?Decreased Interest 2 2 0 0 2  ?Down, Depressed, Hopeless 1 1 0 0 1  ?PHQ - 2 Score 3 3 0 0 3  ?Altered sleeping 3 0 0 0 1  ?Tired, decreased energy 0 3 0 0 1  ?Change in appetite 0 0 0 0 0  ?Feeling bad or failure about yourself  0 0 0 0 1  ?Trouble concentrating 3 0 0 0 0  ?Moving slowly or fidgety/restless 0 0 0 0 0   ?Suicidal thoughts 0 0 0 0 0  ?PHQ-9 Score 9 6 0 0 6  ?Difficult doing work/chores  Not difficult at all  Not difficult at all   ?  ?phq 9 is positive ? ? ?Fall Risk: ? ?  02/21/2022  ? 10:33 AM 10/26/2021  ?  2:16 P

## 2022-02-21 ENCOUNTER — Ambulatory Visit (INDEPENDENT_AMBULATORY_CARE_PROVIDER_SITE_OTHER): Payer: PRIVATE HEALTH INSURANCE | Admitting: Family Medicine

## 2022-02-21 ENCOUNTER — Encounter: Payer: Self-pay | Admitting: Family Medicine

## 2022-02-21 VITALS — BP 118/68 | HR 88 | Resp 16 | Ht 65.0 in | Wt 331.0 lb

## 2022-02-21 DIAGNOSIS — F3131 Bipolar disorder, current episode depressed, mild: Secondary | ICD-10-CM

## 2022-02-21 DIAGNOSIS — L2082 Flexural eczema: Secondary | ICD-10-CM

## 2022-02-21 DIAGNOSIS — R21 Rash and other nonspecific skin eruption: Secondary | ICD-10-CM

## 2022-02-21 DIAGNOSIS — G4733 Obstructive sleep apnea (adult) (pediatric): Secondary | ICD-10-CM

## 2022-02-21 DIAGNOSIS — L720 Epidermal cyst: Secondary | ICD-10-CM

## 2022-02-21 DIAGNOSIS — J4530 Mild persistent asthma, uncomplicated: Secondary | ICD-10-CM

## 2022-02-21 DIAGNOSIS — Z6841 Body Mass Index (BMI) 40.0 and over, adult: Secondary | ICD-10-CM

## 2022-02-21 MED ORDER — VALACYCLOVIR HCL 500 MG PO TABS: 500.0000 mg | ORAL_TABLET | Freq: Every day | ORAL | 1 refills | Status: DC

## 2022-02-21 MED ORDER — WEGOVY 0.5 MG/0.5ML ~~LOC~~ SOAJ: 0.5000 mg | SUBCUTANEOUS | 1 refills | Status: DC

## 2022-02-21 MED ORDER — PIMECROLIMUS 1 % EX CREA: TOPICAL_CREAM | Freq: Two times a day (BID) | CUTANEOUS | 1 refills | Status: DC

## 2022-02-21 NOTE — Addendum Note (Signed)
Addended by: Steele Sizer F on: 02/21/2022 11:15 AM ? ? Modules accepted: Orders ? ?

## 2022-03-11 ENCOUNTER — Encounter: Payer: Self-pay | Admitting: Dermatology

## 2022-03-11 ENCOUNTER — Ambulatory Visit: Payer: PRIVATE HEALTH INSURANCE | Admitting: Dermatology

## 2022-03-11 DIAGNOSIS — L578 Other skin changes due to chronic exposure to nonionizing radiation: Secondary | ICD-10-CM

## 2022-03-11 DIAGNOSIS — L814 Other melanin hyperpigmentation: Secondary | ICD-10-CM

## 2022-03-11 DIAGNOSIS — L720 Epidermal cyst: Secondary | ICD-10-CM | POA: Diagnosis not present

## 2022-03-11 NOTE — Patient Instructions (Signed)
? ?Pre-Operative Instructions ? ?You are scheduled for a surgical procedure at [facility]. We recommend you read the following instructions. If you have any questions or concerns, please call the office at [phone]. ? ?Shower and wash the entire body with soap and water the day of your surgery paying special attention to cleansing at and around the planned surgery site. ? ?Avoid aspirin or aspirin containing products at least fourteen (14) days prior to your surgical procedure and for at least one week (7 Days) after your surgical procedure. If you take aspirin on a regular basis for heart disease or history of stroke or for any other reason, we may recommend you continue taking aspirin but please notify us if you take this on a regular basis. Aspirin can cause more bleeding to occur during surgery as well as prolonged bleeding and bruising after surgery.  ? ?Avoid other nonsteroidal pain medications at least one week prior to surgery and at least one week prior to your surgery. These include medications such as Ibuprofen (Motrin, Advil and Nuprin), Naprosyn, Voltaren, Relafen, etc. If medications are used for therapeutic reasons, please inform us as they can cause increased bleeding or prolonged bleeding during and bruising after surgical procedures.  ? ?Please advise [place] if you are taking any "blood thinner" medications such as Coumadin or Dipyridamole or Plavix or similar medications. These cause increased bleeding and prolonged bleeding during procedures and bruising after surgical procedures. We may have to consider discontinuing these medications briefly prior to and shortly after your surgery if safe to do so.  ? ?Please inform us of all medications you are currently taking. All medications that are taken regularly should be taken the day of surgery as you always do. Nevertheless, we need to be informed of what medications you are taking prior to surgery to know whether they will affect the  procedure or cause any complications.  ? ?Please inform us of any medication allergies. Also inform us of whether you have allergies to Latex or rubber products or whether you have had any adverse reaction to Lidocaine or Epinephrine. ? ?Please inform us of any prosthetic or artificial body parts such as artificial heart valve, joint replacements, etc., or similar condition that might require preoperative antibiotics.  ? ?We recommend avoidance of alcohol at least two weeks prior to surgery and continued avoidance for at least two weeks after surgery.  ? ?We recommend discontinuation of tobacco smoking at least two weeks prior to surgery and continued abstinence for at least two weeks after surgery. ? ?Do not plan strenuous exercise, strenuous work or strenuous lifting for approximately four weeks after your surgery.  ? ?We request if you are unable to make your scheduled surgical appointment, please call us at least a week in advance or as soon as you are aware of a problem so that we can cancel or reschedule the appointment.  ? ?You MAY TAKE TYLENOL (acetaminophen) for pain as it is not a blood thinner.  ? ?PLEASE PLAN TO BE IN TOWN FOR TWO WEEKS FOLLOWING SURGERY, THIS IS IMPORTANT SO YOU CAN BE CHECKED FOR DRESSING CHANGES, SUTURE REMOVAL AND TO MONITOR FOR POSSIBLE COMPLICATIONS.  ? ?If You Need Anything After Your Visit ? ?If you have any questions or concerns for your doctor, please call our main line at [phone] and press option 4 to reach your doctor's medical assistant. If no one answers, please leave a voicemail as directed and we will return your call as soon as  possible. Messages left after 4 pm will be answered the following business day.  ? ?You may also send [place] a message via MyChart. We typically respond to MyChart messages within 1-2 business days. ? ?For prescription refills, please ask your pharmacy to contact our office. Our fax number is [phone]. ? ?If you have an urgent issue when the  clinic is closed that cannot wait until the next business day, you can page your doctor at the number below.   ? ?Please note that while we do our best to be available for urgent issues outside of office hours, we are not available 24/7.  ? ?If you have an urgent issue and are unable to reach [place], you may choose to seek medical care at your doctor's office, retail clinic, urgent care center, or emergency room. ? ?If you have a medical emergency, please immediately call 911 or go to the emergency department. ? ?Pager Numbers ? ?- Dr. Nehemiah Massed: [phone] ? ?- Dr. Laurence Ferrari: [phone] ? ?- Dr. Nicole Kindred: [phone] ? ?In the event of inclement weather, please call our main line at [phone] for an update on the status of any delays or closures. ? ?Dermatology Medication Tips: ?Please keep the boxes that topical medications come in in order to help keep track of the instructions about where and how to use these. Pharmacies typically print the medication instructions only on the boxes and not directly on the medication tubes.  ? ?If your medication is too expensive, please contact our office at [phone] option 4 or send [place] a message through [place].  ? ?We are unable to tell what your co-pay for medications will be in advance as this is different depending on your insurance coverage. However, we may be able to find a substitute medication at lower cost or fill out paperwork to get insurance to cover a needed medication.  ? ?If a prior authorization is required to get your medication covered by your insurance company, please allow [place] 1-2 business days to complete this process. ? ?Drug prices often vary depending on where the prescription is filled and some pharmacies may offer cheaper prices. ? ?The website www.goodrx.com contains coupons for medications through different pharmacies. The prices here do not account for what the cost may be with help from insurance (it may be cheaper with your insurance), but the  website can give you the price if you did not use any insurance.  ?- You can print the associated coupon and take it with your prescription to the pharmacy.  ?- You may also stop by our office during regular business hours and pick up a GoodRx coupon card.  ?- If you need your prescription sent electronically to a different pharmacy, notify our office through [facility] or by phone at [phone] option 4. ? ? ? ? ?Si Usted Necesita Algo Despu?s de Su Visita ? ?Tambi?n puede enviarnos un mensaje a trav?s de MyChart. Por lo general respondemos a los mensajes de MyChart en el transcurso de 1 a 2 d?as h?biles. ? ?Para renovar recetas, por favor pida a su farmacia que se ponga en contacto con nuestra oficina. Nuestro n?mero de fax es el [phone]. ? ?Si tiene un asunto urgente cuando la cl?nica est? cerrada y que no puede esperar hasta el siguiente d?a h?bil, puede llamar/localizar a su doctor(a) al n?mero que aparece a continuaci?n.  ? ?Por favor, tenga en cuenta que aunque hacemos todo lo posible para estar disponibles para asuntos urgentes fuera del horario de [place],  no estamos disponibles las 24 horas del d?a, los 7 d?as de la semana.  ? ?Si tiene un problema urgente y no puede comunicarse con nosotros, puede optar por buscar atenci?n m?dica  en el consultorio de su doctor(a), en una cl?nica privada, en un centro de atenci?n urgente o en una sala de emergencias. ? ?Si tiene Engineer, maintenance (IT) m?dica, por favor llame inmediatamente al 911 o vaya a la sala de emergencias. ? ?N?meros de b?per ? ?- Dr. Nehemiah Massed: [phone] ? ?- Dra. Moye: [phone] ? ?- Dra. Nicole Kindred: [phone] ? ?En caso de inclemencias del tiempo, por favor llame a nuestra l?nea principal al [phone] para una actualizaci?n sobre el estado de cualquier retraso o cierre. ? ?Consejos para la medicaci?n en dermatolog?a: ?Por favor, guarde las cajas en las que vienen los medicamentos de uso t?pico para ayudarle a seguir las  instrucciones sobre d?nde y c?mo usarlos. Las farmacias generalmente imprimen las instrucciones del medicamento s?lo en las cajas y no directamente en los tubos del [place].  ? ?Si su medicamento es [employer], por

## 2022-03-11 NOTE — Progress Notes (Signed)
? ?  New Patient Visit ? ?Subjective  ?Tracey Perry is a 31 y.o. female who presents for the following: Cyst (Back. Dur: 2 years. Itches at times. Gotten larger. Would like to discuss treatment). ?The patient has spots, moles and lesions to be evaluated, some may be new or changing and the patient has concerns that these could be cancer. ? ?Review of Systems: No other skin or systemic complaints except as noted in HPI or Assessment and Plan. ? ?Objective  ?Well appearing patient in no apparent distress; mood and affect are within normal limits. ? ?A focused examination was performed including face, back. Relevant physical exam findings are noted in the Assessment and Plan. ? ?Left Upper Back ?0.8 cm cystic papule ? ? ?Assessment & Plan  ?Epidermal inclusion cyst ?Left Upper Back ?Benign-appearing. Exam most consistent with an epidermal inclusion cyst. Discussed that a cyst is a benign growth that can grow over time and sometimes get irritated or inflamed. Recommend observation if it is not bothersome. Discussed option of surgical excision to remove it if it is growing, symptomatic, or other changes noted. Please call for new or changing lesions so they can be evaluated. ?RTC for surgery. ? ?Lentigines ?- Scattered tan macules ?- Due to sun exposure ?- Benign-appering, observe ?- Recommend daily broad spectrum sunscreen SPF 30+ to sun-exposed areas, reapply every 2 hours as needed. ?- Call for any changes  ? ?Actinic Damage ?- chronic, secondary to cumulative UV radiation exposure/sun exposure over time ?- diffuse scaly erythematous macules with underlying dyspigmentation ?- Recommend daily broad spectrum sunscreen SPF 30+ to sun-exposed areas, reapply every 2 hours as needed.  ?- Recommend staying in the shade or wearing long sleeves, sun glasses (UVA+UVB protection) and wide brim hats (4-inch brim around the entire circumference of the hat). ?- Call for new or changing lesions. ? ?Return for Cyst Excision,  left upper back, Next Available. ? ?I, Emelia Salisbury, CMA, am acting as scribe for Sarina Ser, MD. ?Documentation: I have reviewed the above documentation for accuracy and completeness, and I agree with the above. ? ?Sarina Ser, MD ? ? ?

## 2022-03-18 ENCOUNTER — Encounter: Payer: Self-pay | Admitting: Family Medicine

## 2022-03-18 ENCOUNTER — Ambulatory Visit (INDEPENDENT_AMBULATORY_CARE_PROVIDER_SITE_OTHER): Payer: PRIVATE HEALTH INSURANCE | Admitting: Family Medicine

## 2022-03-18 VITALS — BP 120/66 | HR 92 | Resp 16 | Ht 65.0 in | Wt 325.0 lb

## 2022-03-18 DIAGNOSIS — Z23 Encounter for immunization: Secondary | ICD-10-CM | POA: Diagnosis not present

## 2022-03-18 DIAGNOSIS — Z0000 Encounter for general adult medical examination without abnormal findings: Secondary | ICD-10-CM

## 2022-03-18 DIAGNOSIS — E538 Deficiency of other specified B group vitamins: Secondary | ICD-10-CM | POA: Diagnosis not present

## 2022-03-18 NOTE — Progress Notes (Signed)
Name: Tracey Perry   MRN: [id number]    DOB: November 13, 1991   Date:03/18/2022 ? ?     Progress Note ? ?Subjective ? ?Chief Complaint ? ?Annual Exam ? ?HPI ? ?Patient presents for annual CPE. ? ?Diet: she is eating less since started on Wegovy ?Exercise:  discussed 150 minutes per week  ? ?[place] [facility] Visit from 02/21/2022 in [facility]  ?AUDIT-C Score 0  ? ?  ? ?Depression: Phq 9 is  positive ? ?  03/18/2022  ?  8:01 AM 02/21/2022  ? 10:33 AM 10/26/2021  ?  2:16 PM 10/17/2021  ? 11:16 AM 09/17/2021  ?  8:54 AM  ?Depression screen PHQ 2/9  ?Decreased Interest '1 2 2 ' 0 0  ?Down, Depressed, Hopeless '1 1 1 ' 0 0  ?PHQ - 2 Score '2 3 3 ' 0 0  ?Altered sleeping 0 3 0 0 0  ?Tired, decreased energy 0 0 3 0 0  ?Change in appetite 0 0 0 0 0  ?Feeling bad or failure about yourself  1 0 0 0 0  ?Trouble concentrating 0 3 0 0 0  ?Moving slowly or fidgety/restless 0 0 0 0 0  ?Suicidal thoughts 0 0 0 0 0  ?PHQ-9 Score '3 9 6 ' 0 0  ?Difficult doing work/chores   Not difficult at all  Not difficult at all  ? ?Hypertension: ?BP Readings from Last 3 Encounters:  ?03/18/22 120/66  ?02/21/22 118/68  ?10/26/21 128/70  ? ?Obesity: ?Wt Readings from Last 3 Encounters:  ?03/18/22 (!) 325 lb (147.4 kg)  ?02/21/22 (!) 331 lb (150.1 kg)  ?10/26/21 (!) 324 lb 1.6 oz (147 kg)  ? ?BMI Readings from Last 3 Encounters:  ?03/18/22 54.08 kg/m?  ?02/21/22 55.08 kg/m?  ?10/26/21 53.93 kg/m?  ?  ? ?Vaccines:  ? ?HPV: up to date ?Tdap: up to date ?Shingrix: N/A ?Pneumonia: up to date  ?Flu: up to date ?COVID-19: up to date  ? ? ?Hep C Screening: 03/14/21 ?STD testing and prevention (HIV/chl/gon/syphilis): 11/22/12 ?Intimate partner violence: negative screen  ?Sexual History : not sexually in a while and sees gyn for pap smears  ?Menstrual History/LMP/Abnormal Bleeding: takes ocp around the clock to control cramping and heavy cycles and spots occasionally now  ?Discussed importance of follow up if any post-menopausal bleeding: not applicable   ?Incontinence Symptoms: negative for symptoms  ? ?Breast cancer:  ?- Last Mammogram: N/A ?- BRCA gene screening: N/A  ? ?Osteoporosis Prevention : Discussed high calcium and vitamin D supplementation, weight bearing exercises ?Bone density :not applicable  ? ?Cervical cancer screening: Has GYN- 3 weeks ago ? ?Skin cancer: Discussed monitoring for atypical lesions - sees Dermatologist  ?Dental exam: up to date ?Eye exam: up to date  ?Colorectal cancer: N/A   ?Lung cancer:  Low Dose CT Chest recommended if Age 58-80 years, 20 pack-year currently smoking OR have quit w/in 15years. Patient does not qualify for screen   ?ECG: 09/12/21 ? ?Advanced Care Planning: A voluntary discussion about advance care planning including the explanation and discussion of advance directives.  Discussed health care proxy and Living will, and the patient was able to identify a health care proxy as mother .  Patient does not have a living will and power of attorney of health care  ? ?Lipids: ?Lab Results  ?Component Value Date  ? CHOL 186 03/14/2021  ? CHOL 159 10/01/2018  ? CHOL 171 09/24/2012  ? ?Lab Results  ?Component Value Date  ? HDL  51 03/14/2021  ? HDL 49 10/01/2018  ? HDL 62 09/24/2012  ? ?Lab Results  ?Component Value Date  ? LDLCALC 118 (H) 03/14/2021  ? [place] 100 10/01/2018  ? [place] 90 09/24/2012  ? ?Lab Results  ?Component Value Date  ? TRIG 71 03/14/2021  ? TRIG 50 10/01/2018  ? TRIG 97 09/24/2012  ? ?Lab Results  ?Component Value Date  ? CHOLHDL 3.6 03/14/2021  ? ?No results found for: LDLDIRECT ? ?Glucose: ?Glucose  ?Date Value Ref Range Status  ?10/26/2021 82 70 - 99 mg/dL Final  ?09/17/2021 100 (H) 70 - 99 mg/dL Final  ?04/08/2013 86 65 - 99 mg/dL Final  ? ?Glucose, Bld  ?Date Value Ref Range Status  ?03/14/2021 86 65 - 99 mg/dL Final  ?  Comment:  ?  . ?           Fasting reference interval ?. ?  ? ? ?Patient Active Problem List  ? Diagnosis Date Noted  ? Shortness of breath 09/17/2021  ? Episodic tension-type  headache, not intractable 04/25/2021  ? 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 Lower abdominal pain 02/06/2021  ? Anxiety 12/29/2020  ? Cervical spondylosis with myelopathy and radiculopathy 12/11/2020  ? Cervical stenosis of spinal canal 12/11/2020  ? Asthma exacerbation, mild 12/06/2020  ? Chronic pain 12/06/2020  ? Cervical spinal stenosis 05/05/2020  ? Bulging of cervical intervertebral disc 05/05/2020  ? Cervical radiculopathy 05/05/2020  ? Bipolar 2 disorder ([place]) 12/15/2018  ? Fever blister 07/29/2016  ? Menorrhagia with irregular cycle 06/20/2016  ? OSA (obstructive sleep apnea) 05/11/2016  ? Hypersomnia 12/25/2015  ? Snoring 12/25/2015  ? 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 H/O urinary tract infection 06/21/2015  ? 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 Depression, major, recurrent, moderate ([place]) 06/21/2015  ? Gastroparesis 06/21/2015  ? Hx of cold sores 06/21/2015  ? Generalized anxiety disorder 05/31/2015  ? History of pyelonephritis 05/30/2015  ? Dysmenorrhea 05/30/2015  ? History of asthma 05/30/2015  ? Migraine without aura and responsive to treatment 05/30/2015  ? Morbid obesity, unspecified obesity type ([place]) 05/30/2015  ? Allergic rhinitis, seasonal 05/30/2015  ? Vertigo 05/30/2015  ? IBS (irritable bowel syndrome) 09/20/2013  ? 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  ? H/O cervical spine surgery 12/29/2012  ? Vitamin D deficiency 11/23/2009  ? History of mononucleosis 04/03/2009  ? ? ?Past Surgical History:  ?Procedure Laterality Date  ? ADENOIDECTOMY    ? anterior cervical decompression  Left 12/12/2020  ? revision with removal of hardware and decompression  - done at [facility]  ? APPENDECTOMY    ? CHOLECYSTECTOMY    ? COLONOSCOPY WITH PROPOFOL N/A 04/30/2021  ? Procedure: COLONOSCOPY WITH PROPOFOL;  Surgeon: Lin Landsman, MD;  Location: [facility] ENDOSCOPY;  Service: Gastroenterology;  Laterality: N/A;  ? TONSILLECTOMY    ? TYMPANOSTOMY TUBE PLACEMENT    ? ? ?Family History  ?Problem  Relation Age of Onset  ? Depression Mother   ? Hypertension Mother   ?     controlled  ? Allergic rhinitis Mother   ? Anxiety disorder Mother   ? Diabetes Father   ?     controlled  ? Hypertension Father   ? Allergic rhinitis Father   ? Depression Father   ? Depression Brother   ? Anxiety disorder Brother   ? ? ?Social History  ? ?Socioeconomic History  ? Marital status: Single  ?  Spouse name: Not on file  ? Number of children: 0  ? Years  of education: Not on file  ? Highest education level: Master's degree (e.g., MA, MS, MEng, MEd, MSW, MBA)  ?Occupational History  ? Occupation: retail   ?  Comment: she is applying for a position in a [employer]  ?Tobacco Use  ? Smoking status: Never  ? Smokeless tobacco: Never  ?Vaping Use  ? Vaping Use: Never used  ?Substance and Sexual Activity  ? Alcohol use: Not Currently  ?  Alcohol/week: 0.0 standard drinks  ? Drug use: No  ? Sexual activity: Never  ?  Birth control/protection: I.U.D.  ?Other Topics Concern  ? Not on file  ?Social History Narrative  ? 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  ? ?Social Determinants of Health  ? ?Financial Resource Strain: Low Risk   ? Difficulty of Paying Living Expenses: Not hard at all  ?Food Insecurity: No Food Insecurity  ? Worried About Charity fundraiser in the Last Year: Never true  ? Ran Out of Food in the Last Year: Never true  ?Transportation Needs: No Transportation Needs  ? Lack of Transportation (Medical): No  ? Lack of Transportation (Non-Medical): No  ?Physical Activity: Insufficiently Active  ? Days of Exercise per Week: 2 days  ? Minutes of Exercise per Session: 20 min  ?Stress: Stress Concern Present  ? Feeling of Stress : To some extent  ?Social Connections: Socially Isolated  ? Frequency of Communication with Friends and Family: More than three times a week  ? Frequency of Social Gatherings with Friends and Family: Once a week  ? Attends Religious Services: Never  ?  Active Member of Clubs or Organizations: No  ? Attends Archivist Meetings: Never  ? Marital Status: Never married  ?Intimate Partner Violence: Not At Risk  ? Fear of Current or Ex-Partner: No  ? Emotionally [place]

## 2022-03-18 NOTE — Patient Instructions (Signed)
Preventive Care 21-33 Years Old, Female ?Preventive care refers to lifestyle choices and visits with your health care provider that can promote health and wellness. Preventive care visits are also called wellness exams. ?What can I expect for my preventive care visit? ?Counseling ?During your preventive care visit, your health care provider may ask about your: ?Medical history, including: ?Past medical problems. ?Family medical history. ?Pregnancy history. ?Current health, including: ?Menstrual cycle. ?Method of birth control. ?Emotional well-being. ?Home life and relationship well-being. ?Sexual activity and sexual health. ?Lifestyle, including: ?Alcohol, nicotine or tobacco, and drug use. ?Access to firearms. ?Diet, exercise, and sleep habits. ?Work and work environment. ?Sunscreen use. ?Safety issues such as seatbelt and bike helmet use. ?Physical exam ?Your health care provider may check your: ?Height and weight. These may be used to calculate your BMI (body mass index). BMI is a measurement that tells if you are at a healthy weight. ?Waist circumference. This measures the distance around your waistline. This measurement also tells if you are at a healthy weight and may help predict your risk of certain diseases, such as type 2 diabetes and high blood pressure. ?Heart rate and blood pressure. ?Body temperature. ?Skin for abnormal spots. ?What immunizations do I need? ? ?Vaccines are usually given at various ages, according to a schedule. Your health care provider will recommend vaccines for you based on your age, medical history, and lifestyle or other factors, such as travel or where you work. ?What tests do I need? ?Screening ?Your health care provider may recommend screening tests for certain conditions. This may include: ?Pelvic exam and Pap test. ?Lipid and cholesterol levels. ?Diabetes screening. This is done by checking your blood sugar (glucose) after you have not eaten for a while (fasting). ?Hepatitis  B test. ?Hepatitis C test. ?HIV (human immunodeficiency virus) test. ?STI (sexually transmitted infection) testing, if you are at risk. ?BRCA-related cancer screening. This may be done if you have a family history of breast, ovarian, tubal, or peritoneal cancers. ?Talk with your health care provider about your test results, treatment options, and if necessary, the need for more tests. ?Follow these instructions at home: ?Eating and drinking ? ?Eat a healthy diet that includes fresh fruits and vegetables, whole grains, lean protein, and low-fat dairy products. ?Take vitamin and mineral supplements as recommended by your health care provider. ?Do not drink alcohol if: ?Your health care provider tells you not to drink. ?You are pregnant, may be pregnant, or are planning to become pregnant. ?If you drink alcohol: ?Limit how much you have to 0-1 drink a day. ?Know how much alcohol is in your drink. In the [place] one drink equals one 12 oz bottle of beer (355 mL), one 5 oz glass of wine (148 mL), or one 1? oz glass of hard liquor (44 mL). ?Lifestyle ?Brush your teeth every morning and night with fluoride toothpaste. Floss one time each day. ?Exercise for at least 30 minutes 5 or more days each week. ?Do not use any products that contain nicotine or tobacco. These products include cigarettes, chewing tobacco, and vaping devices, such as e-cigarettes. If you need help quitting, ask your health care provider. ?Do not use drugs. ?If you are sexually active, practice safe sex. Use a condom or other form of protection to prevent STIs. ?If you do not wish to become pregnant, use a form of birth control. If you plan to become pregnant, see your health care provider for a prepregnancy visit. ?Find healthy ways to manage stress, such as: ?Meditation,   yoga, or listening to music. ?Journaling. ?Talking to a trusted person. ?Spending time with friends and family. ?Minimize exposure to UV radiation to reduce your risk of skin  cancer. ?Safety ?Always wear your seat belt while driving or riding in a vehicle. ?Do not drive: ?If you have been drinking alcohol. Do not ride with someone who has been drinking. ?If you have been using any mind-altering substances or drugs. ?While texting. ?When you are tired or distracted. ?Wear a helmet and other protective equipment during sports activities. ?If you have firearms in your house, make sure you follow all gun safety procedures. ?Seek help if you have been physically or sexually abused. ?What's next? ?Go to your health care provider once a year for an annual wellness visit. ?Ask your health care provider how often you should have your eyes and teeth checked. ?Stay up to date on all vaccines. ?This information is not intended to replace advice given to you by your health care provider. Make sure you discuss any questions you have with your health care provider. ?Document Revised: 05/02/2021 Document Reviewed: 05/02/2021 ?Elsevier Patient Education ? [place]. ? ?

## 2022-03-19 ENCOUNTER — Encounter: Payer: Self-pay | Admitting: Dermatology

## 2022-03-19 ENCOUNTER — Telehealth: Payer: Self-pay

## 2022-03-19 NOTE — Telephone Encounter (Signed)
Copied from [place] [id number]. Topic: Appointment Scheduling - Scheduling Inquiry for Clinic ?>> Mar 19, 2022  1:30 PM Tessa Lerner A wrote: ?Reason for CRM: The patient would like to be seen by Dr. Ancil Boozer on 05/13/22 to address their Mancel Parsons concerns and follow up on the medication  ? ?The patient ha requested to specifically see their PCP ? ?Please contact the patient further when possible to address scheduling ?>> Mar 19, 2022  1:53 PM Orvis Brill B wrote: ?Please get with [place] up front as to where we can schedule this patient per this message. Thanks ?

## 2022-03-20 NOTE — Telephone Encounter (Signed)
I have scheduled her for 6.26.2023 at 8:40

## 2022-04-10 LAB — VITAMIN B12: Vitamin B-12: 764 pg/mL (ref 232–1245)

## 2022-05-07 ENCOUNTER — Ambulatory Visit: Payer: PRIVATE HEALTH INSURANCE | Admitting: Dermatology

## 2022-05-07 DIAGNOSIS — L910 Hypertrophic scar: Secondary | ICD-10-CM

## 2022-05-07 DIAGNOSIS — D485 Neoplasm of uncertain behavior of skin: Secondary | ICD-10-CM

## 2022-05-07 DIAGNOSIS — D492 Neoplasm of unspecified behavior of bone, soft tissue, and skin: Secondary | ICD-10-CM

## 2022-05-07 MED ORDER — MUPIROCIN 2 % EX OINT: 1.0000 | TOPICAL_OINTMENT | Freq: Every day | CUTANEOUS | 1 refills | Status: DC

## 2022-05-07 NOTE — Progress Notes (Signed)
   Follow-Up Visit   Subjective  Tracey Perry is a 31 y.o. female who presents for the following: cyst vs other (L upper back, pt presents for excision).  The following portions of the chart were reviewed this encounter and updated as appropriate:   Tobacco  Allergies  Meds  Problems  Med Hx  Surg Hx  Fam Hx     Review of Systems:  No other skin or systemic complaints except as noted in HPI or Assessment and Plan.  Objective  Well appearing patient in no apparent distress; mood and affect are within normal limits.  A focused examination was performed including back. Relevant physical exam findings are noted in the Assessment and Plan.  Left Upper Back Cystic pap 1.5 x 1.0cm   Assessment & Plan  Neoplasm of skin Left Upper Back  Skin excision  Lesion length (cm):  1.5 Lesion width (cm):  1 Margin per side (cm):  0 Total excision diameter (cm):  1.5 Informed consent: discussed and consent obtained   Timeout: patient name, date of birth, surgical site, and procedure verified   Procedure prep:  Patient was prepped and draped in usual sterile fashion Prep type:  Isopropyl alcohol and povidone-iodine Anesthesia: the lesion was anesthetized in a standard fashion   Anesthetic:  1% lidocaine w/ epinephrine 1-100,000 buffered w/ 8.4% NaHCO3 Instrument used: #15 blade   Hemostasis achieved with: pressure   Hemostasis achieved with comment:  Electrocautery Outcome: patient tolerated procedure well with no complications   Post-procedure details: sterile dressing applied and wound care instructions given   Dressing type: bandage and pressure dressing (Mupirocin)    Skin repair Complexity:  Complex Final length (cm):  3 Reason for type of repair: reduce tension to allow closure, reduce the risk of dehiscence, infection, and necrosis, reduce subcutaneous dead space and avoid a hematoma, allow closure of the large defect, preserve normal anatomy, preserve normal anatomical  and functional relationships and enhance both functionality and cosmetic results   Undermining: area extensively undermined   Undermining comment:  Undermining Defect 1.0cm Subcutaneous layers (deep stitches):  Suture size:  2-0 Suture type: Vicryl (polyglactin 910)   Subcutaneous suture technique: Inverted Dermal. Fine/surface layer approximation (top stitches):  Suture size:  3-0 Suture type: nylon   Stitches: horizontal mattress and simple running   Suture removal (days):  7 Hemostasis achieved with: pressure Outcome: patient tolerated procedure well with no complications   Post-procedure details: sterile dressing applied and wound care instructions given   Dressing type: bandage, pressure dressing and bacitracin (Mupirocin)   Additional details:  Horizontal mattress x 1, simple running  mupirocin ointment (BACTROBAN) 2 % Apply 1 Application topically daily. Qd to excision site  Cyst vs other, excised today, pathology sent to [facility] per pt request Start Mupirocin oint qd to excision site  Related Procedures Anatomic Pathology Report   Return in about 1 week (around 05/14/2022) for suture removal, horizontal mattress x 1 and simple running.  I, Othelia Pulling, RMA, am acting as scribe for Sarina Ser, MD . Documentation: I have reviewed the above documentation for accuracy and completeness, and I agree with the above.  Sarina Ser, MD

## 2022-05-07 NOTE — Patient Instructions (Signed)

## 2022-05-07 NOTE — Progress Notes (Unsigned)
Name: Tracey Perry   MRN: [id number]    DOB: 1991/03/09   Date:05/08/2022       Progress Note  Subjective  Chief Complaint  Medication Follow Up  HPI  Morbid obesity: BMI is above 50, her weight was 331 lbs April 2023 and we started [facility] April 2023, she has noticed inability to eat as much with medication. She is down to 323 lbs since started medication but having difficulty getting prescription due to nation wide shortage, she is currently on 0.5 mg dose but the 1 mg dose is not available she is willing to try going to 1.7 mg dose until the shortage resolves. She has been trying to walk more often, ordered kids size meals or smaller portion. NO side effects of medications except for mild constipation. She also noticed that she no longer chews her nails   Ear fullness: recently treated for strep throat and is worried she now has an ear infection due to fullness on left side, also has noticed mucus drainage from nostrils. She just finished antibiotics. 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   Lower abdominal pain 02/06/2021   Anxiety 12/29/2020   Cervical spondylosis with myelopathy and radiculopathy 12/11/2020   Cervical stenosis of spinal canal 12/11/2020   Asthma exacerbation, mild 12/06/2020   Chronic pain 12/06/2020   Cervical spinal stenosis 05/05/2020   Bulging of cervical intervertebral disc 05/05/2020   Cervical radiculopathy 05/05/2020   Bipolar 2 disorder ([place]) 12/15/2018   Fever blister 07/29/2016   Menorrhagia with irregular cycle 06/20/2016   OSA (obstructive sleep apnea) 05/11/2016   Hypersomnia 12/25/2015   Snoring 12/25/2015   Cluster B personality disorder ([place]) 10/27/2015   H/O  urinary tract infection 06/21/2015   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   Depression, major, recurrent, moderate ([place]) 06/21/2015   Gastroparesis 06/21/2015   Hx of cold sores 06/21/2015   Generalized anxiety disorder 05/31/2015   History of pyelonephritis 05/30/2015   Dysmenorrhea 05/30/2015   History of asthma 05/30/2015   Migraine without aura and responsive to treatment 05/30/2015   Morbid obesity, unspecified obesity type ([place]) 05/30/2015   Allergic rhinitis, seasonal 05/30/2015   Vertigo 05/30/2015   IBS (irritable bowel syndrome) 09/20/2013   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   H/O cervical spine surgery 12/29/2012   Vitamin D deficiency 11/23/2009   History of mononucleosis 04/03/2009    Past Surgical History:  Procedure Laterality Date   ADENOIDECTOMY     anterior cervical decompression  Left 12/12/2020   revision with removal of hardware and decompression  - done at [facility] N/A 04/30/2021   Procedure: COLONOSCOPY WITH PROPOFOL;  Surgeon: Lin Landsman, MD;  Location: [facility] ENDOSCOPY;  Service: Gastroenterology;  Laterality: N/A;   TONSILLECTOMY     TYMPANOSTOMY TUBE PLACEMENT      Family History  Problem Relation Age of Onset   Depression Mother    Hypertension Mother        controlled   Allergic rhinitis Mother    Anxiety disorder Mother    Diabetes Father        controlled   Hypertension Father    Allergic rhinitis  Father    Depression Father    Depression Brother    Anxiety disorder Brother     Social History   Tobacco Use   Smoking status: Never   Smokeless tobacco: Never  Substance Use Topics   Alcohol use: Not Currently    Alcohol/week: 0.0 standard drinks of alcohol     Current Outpatient Medications:    albuterol (VENTOLIN HFA) 108 (90 Base) MCG/ACT inhaler, Inhale 2 puffs into the lungs every 4 (four) hours as needed for wheezing or shortness of breath., Disp: 18 g, Rfl: 0    amitriptyline (ELAVIL) 25 MG tablet, Take 25 mg by mouth at bedtime., Disp: , Rfl:    budesonide-formoterol (SYMBICORT) 160-4.5 MCG/ACT inhaler, Inhale 2 puffs into the lungs 2 (two) times daily., Disp: 3 each, Rfl: 3   clonazePAM (KLONOPIN) 0.5 MG tablet, Take 1 tablet (0.5 mg total) by mouth 2 (two) times daily., Disp: 60 tablet, Rfl: 4   famotidine (PEPCID) 10 MG tablet, Take by mouth., Disp: , Rfl:    fluticasone (FLONASE) 50 MCG/ACT nasal spray, Place into both nostrils daily., Disp: , Rfl:    indomethacin (INDOCIN) 25 MG capsule, Take 1 tablet PO Q8H x 2 weeks, the 2 PO Q8H x 2 weeks, then 3 PO Q8H. DO NOT CRUSH. TAKE WITH FOOD., Disp: , Rfl:    lamoTRIgine (LAMICTAL) 200 MG tablet, Take 200 mg by mouth daily., Disp: , Rfl:    levocetirizine (XYZAL) 5 MG tablet, Take by mouth., Disp: , Rfl:    montelukast (SINGULAIR) 10 MG tablet, Take 1 tablet (10 mg total) by mouth every evening., Disp: 90 tablet, Rfl: 1   mupirocin ointment (BACTROBAN) 2 %, Apply 1 Application topically daily. Qd to excision site, Disp: 22 g, Rfl: 1   norethindrone (AYGESTIN) 5 MG tablet, Take 1 tablet (5 mg total) by mouth daily., Disp: 28 tablet, Rfl: 0   Semaglutide-Weight Management (WEGOVY) 0.5 MG/0.5ML SOAJ, Inject 0.5 mg into the skin once a week., Disp: 2 mL, Rfl: 1   SUMAtriptan (IMITREX) 100 MG tablet, , Disp: , Rfl:    valACYclovir (VALTREX) 500 MG tablet, Take 1 tablet (500 mg total) by mouth daily., Disp: 90 tablet, Rfl: 1   pimecrolimus (ELIDEL) 1 % cream, Apply topically 2 (two) times daily. (Patient not taking: Reported on 05/08/2022), Disp: 100 g, Rfl: 1  Allergies  Allergen Reactions   Apple Itching and Swelling    ONLY certain "FRESH FRUITS" cause throat itches & in spring, throat feels like it will swell.  (apples, peaches, pears, plums, mango)   Prednisone Other (See Comments) and Palpitations    Severe mood swings and tachycardia   Amoxicillin-Pot Clavulanate Other (See Comments)    Mood  swings Mood swings Mood swings   Tape Itching    I personally reviewed active problem list, medication list, allergies, family history, social history, health maintenance with the patient/caregiver today.   ROS   Ten systems reviewed and is negative except as mentioned in HPI   Objective  Vitals:   05/08/22 1313  BP: 118/64  Pulse: (!) 106  Resp: 16  SpO2: 98%  Weight: (!) 323 lb (146.5 kg)  Height: '5\' 5"'$  (1.651 m)    Body mass index is 53.75 kg/m.  Physical Exam  Constitutional: Patient appears well-developed and well-nourished. Obese  No distress.  HEENT: head atraumatic, normocephalic, pupils equal and reactive to light,, neck supple Cardiovascular: Normal rate, regular rhythm and normal heart sounds.  No murmur heard. No BLE  edema. Pulmonary/Chest: Effort normal and breath sounds normal. No respiratory distress. Abdominal: Soft.  There is no tenderness. Psychiatric: Patient has a normal mood and affect. behavior is normal. Judgment and thought content normal.   Recent Results (from the past 2160 hour(s))  Vitamin B12     Status: None   Collection Time: 04/09/22  2:50 PM  Result Value Ref Range   Vitamin B-12 764 232 - 1,245 pg/mL    PHQ2/9:    05/08/2022    1:20 PM 03/18/2022    8:01 AM 02/21/2022   10:33 AM 10/26/2021    2:16 PM 10/17/2021   11:16 AM  Depression screen PHQ 2/9  Decreased Interest '1 1 2 2 '$ 0  Down, Depressed, Hopeless '1 1 1 1 '$ 0  PHQ - 2 Score '2 2 3 3 '$ 0  Altered sleeping 3 0 3 0 0  Tired, decreased energy 3 0 0 3 0  Change in appetite 0 0 0 0 0  Feeling bad or failure about yourself  1 1 0 0 0  Trouble concentrating 0 0 3 0 0  Moving slowly or fidgety/restless 0 0 0 0 0  Suicidal thoughts 0 0 0 0 0  PHQ-9 Score '9 3 9 6 '$ 0  Difficult doing work/chores    Not difficult at all     phq 9 is positive   Fall Risk:    05/08/2022    1:12 PM 03/18/2022    8:00 AM 02/21/2022   10:33 AM 10/26/2021    2:16 PM 10/17/2021   11:16 AM  Fall Risk    Falls in the past year? 0 0 0 0 0  Number falls in past yr: 0 0 0 0 0  Injury with Fall? 0 0 0 0 0  Risk for fall due to : No Fall Risks No Fall Risks No Fall Risks No Fall Risks No Fall Risks  Follow up Falls prevention discussed Falls prevention discussed Falls prevention discussed Falls prevention discussed Falls prevention discussed      Functional Status Survey: Is the patient deaf or have difficulty hearing?: No Does the patient have difficulty seeing, even when wearing glasses/contacts?: No Does the patient have difficulty concentrating, remembering, or making decisions?: Yes Does the patient have difficulty walking or climbing stairs?: No Does the patient have difficulty dressing or bathing?: No Does the patient have difficulty doing errands alone such as visiting a doctor's office or shopping?: No    Assessment & Plan  1. Morbid obesity with BMI of 50.0-59.9, adult (HCC)  - Semaglutide-Weight Management (WEGOVY) 1.7 MG/0.75ML SOAJ; Inject 1.7 mg into the skin once a week.  Dispense: 9 mL; Refill: 0    2. Sensation of fullness in left ear  Normal exam

## 2022-05-08 ENCOUNTER — Telehealth: Payer: Self-pay

## 2022-05-08 ENCOUNTER — Encounter: Payer: Self-pay | Admitting: Family Medicine

## 2022-05-08 ENCOUNTER — Ambulatory Visit: Payer: PRIVATE HEALTH INSURANCE | Admitting: Family Medicine

## 2022-05-08 VITALS — BP 118/64 | HR 106 | Resp 16 | Ht 65.0 in | Wt 323.0 lb

## 2022-05-08 DIAGNOSIS — Z6841 Body Mass Index (BMI) 40.0 and over, adult: Secondary | ICD-10-CM

## 2022-05-08 DIAGNOSIS — H938X2 Other specified disorders of left ear: Secondary | ICD-10-CM | POA: Diagnosis not present

## 2022-05-08 MED ORDER — WEGOVY 1.7 MG/0.75ML ~~LOC~~ SOAJ: 1.7000 mg | SUBCUTANEOUS | 0 refills | Status: DC

## 2022-05-08 NOTE — Telephone Encounter (Signed)
Pt doing fine after yesterdays surgery.  Pt did say she forgot to let us know that she is allergic to tape so she has been itching where we put the pressure dressing.  I advised I would add to her list of allergies.Mariana Kaufman

## 2022-05-09 ENCOUNTER — Encounter: Payer: Self-pay | Admitting: Dermatology

## 2022-05-13 ENCOUNTER — Ambulatory Visit: Payer: PRIVATE HEALTH INSURANCE | Admitting: Internal Medicine

## 2022-05-13 ENCOUNTER — Ambulatory Visit: Payer: PRIVATE HEALTH INSURANCE | Admitting: Family Medicine

## 2022-05-15 LAB — ANATOMIC PATHOLOGY REPORT

## 2022-05-15 LAB — SPECIMEN STATUS REPORT

## 2022-05-16 ENCOUNTER — Ambulatory Visit: Payer: PRIVATE HEALTH INSURANCE

## 2022-05-16 DIAGNOSIS — L910 Hypertrophic scar: Secondary | ICD-10-CM

## 2022-05-16 NOTE — Progress Notes (Signed)
   Follow-Up Visit   Subjective  Tracey Perry is a 31 y.o. female who presents for the following: Suture / Staple Removal (8 days f/u suture removal at left upper back biopsy proven keloid ).    The following portions of the chart were reviewed this encounter and updated as appropriate:       Review of Systems:  No other skin or systemic complaints except as noted in HPI or Assessment and Plan.  Objective  Well appearing patient in no apparent distress; mood and affect are within normal limits.  A focused examination was performed including left upper back. Relevant physical exam findings are noted in the Assessment and Plan.  Left Upper Back Well healed scar     Assessment & Plan  Keloid Left Upper Back  Biopsy results discussed with patient  Encounter for Removal of Sutures - Incision site at the left upper back is clean, dry and intact - Wound cleansed, sutures removed, wound cleansed and steri strips applied.  - Discussed pathology results showing Keloid   - Patient advised to keep steri-strips dry until they fall off. - Scars remodel for a full year. - Once steri-strips fall off, patient can apply over-the-counter silicone scar cream each night to help with scar remodeling if desired. - Patient advised to call with any concerns or if they notice any new or changing lesions.    Return if symptoms worsen or fail to improve.  I, Marye Round, CMA, am acting as scribe for [employer] .

## 2022-05-16 NOTE — Patient Instructions (Signed)
Due to recent changes in healthcare laws, you may see results of your pathology and/or laboratory studies on MyChart before the doctors have had a chance to review them. We understand that in some cases there may be results that are confusing or concerning to you. Please understand that not all results are received at the same time and often the doctors may need to interpret multiple results in order to provide you with the best plan of care or course of treatment. Therefore, we ask that you please give us 2 business days to thoroughly review all your results before contacting the office for clarification. Should we see a critical lab result, you will be contacted sooner.   If You Need Anything After Your Visit  If you have any questions or concerns for your doctor, please call our main line at [phone] and press option 4 to reach your doctor's medical assistant. If no one answers, please leave a voicemail as directed and we will return your call as soon as possible. Messages left after 4 pm will be answered the following business day.   You may also send us a message via MyChart. We typically respond to MyChart messages within 1-2 business days.  For prescription refills, please ask your pharmacy to contact our office. Our fax number is [phone].  If you have an urgent issue when the clinic is closed that cannot wait until the next business day, you can page your doctor at the number below.    Please note that while we do our best to be available for urgent issues outside of office hours, we are not available 24/7.   If you have an urgent issue and are unable to reach us, you may choose to seek medical care at your doctor's office, retail clinic, urgent care center, or emergency room.  If you have a medical emergency, please immediately call 911 or go to the emergency department.  Pager Numbers  - Dr. Kowalski: [phone]  - Dr. Moye: [phone]  - Dr. Stewart:  [phone]  In the event of inclement weather, please call our main line at [phone] for an update on the status of any delays or closures.  Dermatology Medication Tips: Please keep the boxes that topical medications come in in order to help keep track of the instructions about where and how to use these. Pharmacies typically print the medication instructions only on the boxes and not directly on the medication tubes.   If your medication is too expensive, please contact our office at [phone] option 4 or send us a message through MyChart.   We are unable to tell what your co-pay for medications will be in advance as this is different depending on your insurance coverage. However, we may be able to find a substitute medication at lower cost or fill out paperwork to get insurance to cover a needed medication.   If a prior authorization is required to get your medication covered by your insurance company, please allow us 1-2 business days to complete this process.  Drug prices often vary depending on where the prescription is filled and some pharmacies may offer cheaper prices.  The website www.goodrx.com contains coupons for medications through different pharmacies. The prices here do not account for what the cost may be with help from insurance (it may be cheaper with your insurance), but the website can give you the price if you did not use any insurance.  - You can print the associated coupon and take it with   your prescription to the pharmacy.  - You may also stop by our office during regular business hours and pick up a GoodRx coupon card.  - If you need your prescription sent electronically to a different pharmacy, notify our office through [facility] MyChart or by phone at [phone] option 4.     Si Usted Necesita Algo Despus de Su Visita  Tambin puede enviarnos un mensaje a travs de MyChart. Por lo general respondemos a los mensajes de MyChart en el transcurso de 1 a 2  das hbiles.  Para renovar recetas, por favor pida a su farmacia que se ponga en contacto con nuestra oficina. Nuestro nmero de fax es el [phone].  Si tiene un asunto urgente cuando la clnica est cerrada y que no puede esperar hasta el siguiente da hbil, puede llamar/localizar a su doctor(a) al nmero que aparece a continuacin.   Por favor, tenga en cuenta que aunque hacemos todo lo posible para estar disponibles para asuntos urgentes fuera del horario de oficina, no estamos disponibles las 24 horas del da, los 7 das de la semana.   Si tiene un problema urgente y no puede comunicarse con nosotros, puede optar por buscar atencin mdica  en el consultorio de su doctor(a), en una clnica privada, en un centro de atencin urgente o en una sala de emergencias.  Si tiene una emergencia mdica, por favor llame inmediatamente al 911 o vaya a la sala de emergencias.  Nmeros de bper  - Dr. Kowalski: [phone]  - Dra. Moye: [phone]  - Dra. Stewart: [phone]  En caso de inclemencias del tiempo, por favor llame a nuestra lnea principal al [phone] para una actualizacin sobre el estado de cualquier retraso o cierre.  Consejos para la medicacin en dermatologa: Por favor, guarde las cajas en las que vienen los medicamentos de uso tpico para ayudarle a seguir las instrucciones sobre dnde y cmo usarlos. Las farmacias generalmente imprimen las instrucciones del medicamento slo en las cajas y no directamente en los tubos del medicamento.   Si su medicamento es muy caro, por favor, pngase en contacto con nuestra oficina llamando al [phone] y presione la opcin 4 o envenos un mensaje a travs de MyChart.   No podemos decirle cul ser su copago por los medicamentos por adelantado ya que esto es diferente dependiendo de la cobertura de su seguro. Sin embargo, es posible que podamos encontrar un medicamento sustituto a menor costo o llenar un formulario para que el  seguro cubra el medicamento que se considera necesario.   Si se requiere una autorizacin previa para que su compaa de seguros cubra su medicamento, por favor permtanos de 1 a 2 das hbiles para completar este proceso.  Los precios de los medicamentos varan con frecuencia dependiendo del lugar de dnde se surte la receta y alguna farmacias pueden ofrecer precios ms baratos.  El sitio web www.goodrx.com tiene cupones para medicamentos de diferentes farmacias. Los precios aqu no tienen en cuenta lo que podra costar con la ayuda del seguro (puede ser ms barato con su seguro), pero el sitio web puede darle el precio si no utiliz ningn seguro.  - Puede imprimir el cupn correspondiente y llevarlo con su receta a la farmacia.  - Tambin puede pasar por nuestra oficina durante el horario de atencin regular y recoger una tarjeta de cupones de GoodRx.  - Si necesita que su receta se enve electrnicamente a una farmacia diferente, informe a nuestra oficina a travs de MyChart de [place]   o por telfono llamando al [phone] y presione la opcin 4.  

## 2022-05-24 ENCOUNTER — Ambulatory Visit: Payer: PRIVATE HEALTH INSURANCE | Admitting: Family Medicine

## 2022-05-24 ENCOUNTER — Encounter: Payer: Self-pay | Admitting: Family Medicine

## 2022-06-02 ENCOUNTER — Other Ambulatory Visit: Payer: Self-pay | Admitting: Family Medicine

## 2022-06-02 DIAGNOSIS — J4520 Mild intermittent asthma, uncomplicated: Secondary | ICD-10-CM

## 2022-06-06 NOTE — Progress Notes (Signed)
Name: Tracey Perry   MRN: [id number]    DOB: August 09, 1991   Date:06/07/2022       Progress Note  Subjective  Chief Complaint  Paperwork  HPI  Migraine headaches: last seen at [facility] - neurologist Dr. Sharlynn Oliphant, but due to insurance needs to go to a provider under [facility]  She states migraine is stable, episodes a few times a month but when severe needs to go to work late or leave. She states she has photophobia and phonophobia and needs to be a in a quiet room. Sometimes unable to drive or function   Asthma mild intermittent: she states has been using rescue inhaler more often due to poor air quality. She missed one week of work last year due a flare. She is taking singulair daily   Obesity: she skipped dose of 1 mg wegovy due to nation wide shortage, currently on 1.7 mg , lost 2 lbs over the past month, she states pants are getting loser, but needs to start physical activity    Patient Active Problem List   Diagnosis Date Noted   Morbid obesity with BMI of 50.0-59.9, adult ([place]) 05/08/2022   Episodic tension-type headache, not intractable 04/25/2021   Idiopathic stabbing headache 04/25/2021   Anxiety 12/29/2020   Cervical spondylosis with myelopathy and radiculopathy 12/11/2020   Cervical stenosis of spinal canal 12/11/2020   Asthma exacerbation, mild 12/06/2020   Chronic pain 12/06/2020   Cervical spinal stenosis 05/05/2020   Bulging of cervical intervertebral disc 05/05/2020   Cervical radiculopathy 05/05/2020   Bipolar 2 disorder ([place]) 12/15/2018   Fever blister 07/29/2016   Menorrhagia with irregular cycle 06/20/2016   OSA (obstructive sleep apnea) 05/11/2016   Hypersomnia 12/25/2015   Snoring 12/25/2015   Cluster B personality disorder ([place]) 10/27/2015   H/O urinary tract infection 06/21/2015   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   Depression, major, recurrent, moderate ([place]) 06/21/2015   Gastroparesis 06/21/2015   Hx of cold sores 06/21/2015   Generalized anxiety  disorder 05/31/2015   History of pyelonephritis 05/30/2015   Dysmenorrhea 05/30/2015   History of asthma 05/30/2015   Migraine without aura and responsive to treatment 05/30/2015   Allergic rhinitis, seasonal 05/30/2015   Vertigo 05/30/2015   IBS (irritable bowel syndrome) 09/20/2013   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   H/O cervical spine surgery 12/29/2012   Vitamin D deficiency 11/23/2009   History of mononucleosis 04/03/2009    Past Surgical History:  Procedure Laterality Date   ADENOIDECTOMY     anterior cervical decompression  Left 12/12/2020   revision with removal of hardware and decompression  - done at [place] N/A 04/30/2021   Procedure: COLONOSCOPY WITH PROPOFOL;  Surgeon: Lin Landsman, MD;  Location: [facility] ENDOSCOPY;  Service: Gastroenterology;  Laterality: N/A;   TONSILLECTOMY     TYMPANOSTOMY TUBE PLACEMENT      Family History  Problem Relation Age of Onset   Depression Mother    Hypertension Mother        controlled   Allergic rhinitis Mother    Anxiety disorder Mother    Diabetes Father        controlled   Hypertension Father    Allergic rhinitis Father    Depression Father    Depression Brother    Anxiety disorder Brother     Social History   Tobacco Use   Smoking status: Never   Smokeless tobacco: Never  Substance  Use Topics   Alcohol use: Not Currently    Alcohol/week: 0.0 standard drinks of alcohol     Current Outpatient Medications:    albuterol (VENTOLIN HFA) 108 (90 Base) MCG/ACT inhaler, Inhale 2 puffs into the lungs every 4 (four) hours as needed for wheezing or shortness of breath., Disp: 18 g, Rfl: 0   amitriptyline (ELAVIL) 25 MG tablet, Take 25 mg by mouth at bedtime., Disp: , Rfl:    budesonide-formoterol (SYMBICORT) 160-4.5 MCG/ACT inhaler, Inhale 2 puffs into the lungs 2 (two) times daily., Disp: 3 each, Rfl: 3   clonazePAM (KLONOPIN) 0.5 MG tablet, Take 1 tablet  (0.5 mg total) by mouth 2 (two) times daily., Disp: 60 tablet, Rfl: 4   famotidine (PEPCID) 10 MG tablet, Take by mouth., Disp: , Rfl:    fluticasone (FLONASE) 50 MCG/ACT nasal spray, Place into both nostrils daily., Disp: , Rfl:    indomethacin (INDOCIN) 25 MG capsule, Take 1 tablet PO Q8H x 2 weeks, the 2 PO Q8H x 2 weeks, then 3 PO Q8H. DO NOT CRUSH. TAKE WITH FOOD., Disp: , Rfl:    lamoTRIgine (LAMICTAL) 200 MG tablet, Take 200 mg by mouth daily., Disp: , Rfl:    levocetirizine (XYZAL) 5 MG tablet, Take by mouth., Disp: , Rfl:    montelukast (SINGULAIR) 10 MG tablet, Take 1 tablet (10 mg total) by mouth every evening., Disp: 90 tablet, Rfl: 1   norethindrone (AYGESTIN) 5 MG tablet, Take 1 tablet (5 mg total) by mouth daily., Disp: 28 tablet, Rfl: 0   Semaglutide-Weight Management (WEGOVY) 1.7 MG/0.75ML SOAJ, Inject 1.7 mg into the skin once a week., Disp: 9 mL, Rfl: 0   SUMAtriptan (IMITREX) 100 MG tablet, , Disp: , Rfl:    valACYclovir (VALTREX) 500 MG tablet, Take 1 tablet (500 mg total) by mouth daily., Disp: 90 tablet, Rfl: 1   mupirocin ointment (BACTROBAN) 2 %, Apply 1 Application topically daily. Qd to excision site (Patient not taking: Reported on 06/07/2022), Disp: 22 g, Rfl: 1  Allergies  Allergen Reactions   Apple Itching and Swelling    ONLY certain "FRESH FRUITS" cause throat itches & in spring, throat feels like it will swell.  (apples, peaches, pears, plums, mango)   Prednisone Other (See Comments) and Palpitations    Severe mood swings and tachycardia   Amoxicillin-Pot Clavulanate Other (See Comments)    Mood swings Mood swings Mood swings   Tape Itching    I personally reviewed active problem list, medication list, allergies, family history, social history, health maintenance with the patient/caregiver today.   ROS  Constitutional: Negative for fever or weight change.  Respiratory: Negative for cough and shortness of breath.   Cardiovascular: Negative for chest  pain or palpitations.  Gastrointestinal: Negative for abdominal pain, no bowel changes.  Musculoskeletal: Negative for gait problem or joint swelling.  Skin: Negative for rash.  Neurological: Negative for dizziness , positive for headache.  No other specific complaints in a complete review of systems (except as listed in HPI above).   Objective  Vitals:   06/07/22 0839  BP: 116/66  Pulse: 90  Resp: 16  SpO2: 98%  Weight: (!) 321 lb (145.6 kg)  Height: '5\' 5"'$  (1.651 m)    Body mass index is 53.42 kg/m.  Physical Exam  Constitutional: Patient appears well-developed and well-nourished. Obese  No distress.  HEENT: head atraumatic, normocephalic, pupils equal and reactive to light, neck supple Cardiovascular: Normal rate, regular rhythm and normal heart sounds.  No  murmur heard. No BLE edema. Pulmonary/Chest: Effort normal and breath sounds normal. No respiratory distress. Abdominal: Soft.  There is no tenderness. Psychiatric: Patient has a normal mood and affect. behavior is normal. Judgment and thought content normal.   Recent Results (from the past 2160 hour(s))  Vitamin B12     Status: None   Collection Time: 04/09/22  2:50 PM  Result Value Ref Range   Vitamin B-12 764 232 - 1,245 pg/mL  Anatomic Pathology Report     Status: None   Collection Time: 05/08/22 12:00 AM  Result Value Ref Range   Diagnosis synopsis: Comment     Comment: Specimen A-Skin Excision, left upper back: KELOID.   Specimen: Comment     Comment: Specimen A-Skin Excision, left upper back   Clinical diagnosis: Comment     Comment: Specimen A-cyst vs other   Diagnosis: Comment     Comment: Specimen A-KELOID.   Microscopic description: Comment     Comment: Specimen A-The tissue demonstrates a nodular scar with large eosinophilic collagen bundles.    Gross description: Comment     Comment: The specimen is received in formalin labeled "Dicarlo, Marasia, left upper back. Received is a 1.7 x 0.8 x  1.5 cm excision of tan skin with attached fatty tissue. No cystic space is grossly noted. The specimen is serially sectioned and entirely submitted in cassette 1.    Electronically signed by: Comment     Comment: Kathrynn Ducking, MD Board Certified Dermatopathologist and Cytopathologist    CPT code(s): Comment     Comment: Specimen [id number]   CPT Disclaimer: Comment     Comment: CPT codes, as published by the AMA, are provided for informational purpose only as the assignment of the CPT codes is the responsibility of the billing party.    Clinician provided ICD: D49.2    Pathologist provided ICD: L91.0   Specimen status report     Status: None   Collection Time: 05/08/22 12:00 AM  Result Value Ref Range   specimen status report Comment     Comment: Please note Please note The date and/or time of collection was not indicated on the requisition as required by state and federal law.  The date of receipt of the specimen was used as the collection date if not supplied.     PHQ2/9:    06/07/2022    9:02 AM 05/08/2022    1:20 PM 03/18/2022    8:01 AM 02/21/2022   10:33 AM 10/26/2021    2:16 PM  Depression screen PHQ 2/9  Decreased Interest '1 1 1 2 2  '$ Down, Depressed, Hopeless '1 1 1 1 1  '$ PHQ - 2 Score '2 2 2 3 3  '$ Altered sleeping 3 3 0 3 0  Tired, decreased energy 3 3 0 0 3  Change in appetite 0 0 0 0 0  Feeling bad or failure about yourself  0 1 1 0 0  Trouble concentrating 0 0 0 3 0  Moving slowly or fidgety/restless 0 0 0 0 0  Suicidal thoughts 0 0 0 0 0  PHQ-9 Score '8 9 3 9 6  '$ Difficult doing work/chores     Not difficult at all    phq 9 is positive   Fall Risk:    06/07/2022    8:39 AM 05/08/2022    1:12 PM 03/18/2022    8:00 AM 02/21/2022   10:33 AM 10/26/2021    2:16 PM  Fall Risk   Falls in the  past year? 0 0 0 0 0  Number falls in past yr: 0 0 0 0 0  Injury with Fall? 0 0 0 0 0  Risk for fall due to : No Fall Risks No Fall Risks No Fall Risks No Fall Risks No Fall  Risks  Follow up Falls prevention discussed Falls prevention discussed Falls prevention discussed Falls prevention discussed Falls prevention discussed      Functional Status Survey: Is the patient deaf or have difficulty hearing?: No Does the patient have difficulty seeing, even when wearing glasses/contacts?: No Does the patient have difficulty concentrating, remembering, or making decisions?: Yes Does the patient have difficulty walking or climbing stairs?: No Does the patient have difficulty dressing or bathing?: No Does the patient have difficulty doing errands alone such as visiting a doctor's office or shopping?: No    Assessment & Plan  1. Migraine without aura and responsive to treatment  - Ambulatory referral to Neurology  2. Moderate persistent allergic asthma without complication   3. Morbid obesity with BMI of 50.0-59.9, adult (HCC)  - Semaglutide-Weight Management (WEGOVY) 2.4 MG/0.75ML SOAJ; Inject 2.4 mg into the skin once a week.  Dispense: 3 mL; Refill: 1

## 2022-06-07 ENCOUNTER — Ambulatory Visit: Payer: PRIVATE HEALTH INSURANCE | Admitting: Family Medicine

## 2022-06-07 ENCOUNTER — Encounter: Payer: Self-pay | Admitting: Family Medicine

## 2022-06-07 VITALS — BP 116/66 | HR 90 | Resp 16 | Ht 65.0 in | Wt 321.0 lb

## 2022-06-07 DIAGNOSIS — G43009 Migraine without aura, not intractable, without status migrainosus: Secondary | ICD-10-CM | POA: Diagnosis not present

## 2022-06-07 DIAGNOSIS — Z6841 Body Mass Index (BMI) 40.0 and over, adult: Secondary | ICD-10-CM | POA: Diagnosis not present

## 2022-06-07 DIAGNOSIS — J4520 Mild intermittent asthma, uncomplicated: Secondary | ICD-10-CM | POA: Insufficient documentation

## 2022-06-07 DIAGNOSIS — J4540 Moderate persistent asthma, uncomplicated: Secondary | ICD-10-CM | POA: Insufficient documentation

## 2022-06-07 MED ORDER — WEGOVY 2.4 MG/0.75ML ~~LOC~~ SOAJ: 2.4000 mg | SUBCUTANEOUS | 1 refills | Status: DC

## 2022-06-11 ENCOUNTER — Encounter: Payer: Self-pay | Admitting: Family Medicine

## 2022-06-11 DIAGNOSIS — G43009 Migraine without aura, not intractable, without status migrainosus: Secondary | ICD-10-CM

## 2022-06-12 ENCOUNTER — Other Ambulatory Visit: Payer: Self-pay | Admitting: Family Medicine

## 2022-06-12 DIAGNOSIS — Z9889 Other specified postprocedural states: Secondary | ICD-10-CM

## 2022-06-12 DIAGNOSIS — G4733 Obstructive sleep apnea (adult) (pediatric): Secondary | ICD-10-CM

## 2022-06-12 DIAGNOSIS — G43809 Other migraine, not intractable, without status migrainosus: Secondary | ICD-10-CM

## 2022-06-12 DIAGNOSIS — R519 Headache, unspecified: Secondary | ICD-10-CM

## 2022-06-24 ENCOUNTER — Ambulatory Visit: Payer: PRIVATE HEALTH INSURANCE | Admitting: Family Medicine
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
## 2022-07-17 ENCOUNTER — Encounter: Payer: Self-pay | Admitting: *Deleted

## 2022-07-17 ENCOUNTER — Telehealth: Payer: Self-pay | Admitting: *Deleted

## 2022-07-17 ENCOUNTER — Ambulatory Visit (INDEPENDENT_AMBULATORY_CARE_PROVIDER_SITE_OTHER): Payer: PRIVATE HEALTH INSURANCE | Admitting: Psychiatry

## 2022-07-17 VITALS — BP 114/75 | HR 88 | Ht 65.0 in | Wt 317.4 lb

## 2022-07-17 DIAGNOSIS — G43119 Migraine with aura, intractable, without status migrainosus: Secondary | ICD-10-CM

## 2022-07-17 MED ORDER — UBRELVY 100 MG PO TABS: 100.0000 mg | ORAL_TABLET | ORAL | 6 refills | Status: DC | PRN

## 2022-07-17 MED ORDER — ONDANSETRON 8 MG PO TBDP: 8.0000 mg | ORAL_TABLET | Freq: Three times a day (TID) | ORAL | 6 refills | Status: AC | PRN

## 2022-07-17 NOTE — Telephone Encounter (Signed)
Roselyn Meier PA< Key: [id number]. Your information has been sent to [facility].

## 2022-07-17 NOTE — Patient Instructions (Addendum)
Start Ubrelvy 100 mg as needed for migraines. May repeat a dose in 2 hours if headache persists. Max dose 2 pills in 24 hours. OK to take Imitrex with this if needed.  Take Zofran every 8 hours as needed for nausea

## 2022-07-17 NOTE — Telephone Encounter (Signed)
Roselyn Meier approved 07/17/2022 - 10/17/2022. Sent my chart to advise.

## 2022-07-26 NOTE — Progress Notes (Unsigned)
Name: Tracey Perry   MRN: [id number]    DOB: 11/20/1990   Date:07/26/2022       Progress Note  Subjective  Chief Complaint  Follow Up  HPI  Migraine headaches: last seen at [facility] - neurologist Dr. Sharlynn Oliphant, but due to insurance needs to go to a provider under [facility]  She states migraine is stable, episodes a few times a month but when severe needs to go to work late or leave. She states she has photophobia and phonophobia and needs to be a in a quiet room. Sometimes unable to drive or function   Asthma mild intermittent: she states has been using rescue inhaler more often due to poor air quality. She missed one week of work last year due a flare. She is taking singulair daily   Obesity: she skipped dose of 1 mg wegovy due to nation wide shortage, currently on 1.7 mg , lost 2 lbs over the past month, she states pants are getting loser, but needs to start physical activity   Morbid obesity: BMI is above 50, her weight was 331 lbs April 2023 and we started [facility] April 2023, she has noticed inability to eat as much with medication. She is down to 323 lbs since started medication but having difficulty getting prescription due to nation wide shortage, she is currently on 0.5 mg dose but the 1 mg dose is not available she is willing to try going to 1.7 mg dose until the shortage resolves. She has been trying to walk more often, ordered kids size meals or smaller portion. NO side effects of medications except for mild constipation. She also noticed that she no longer chews her nails   OSA: she had initial study in 2017 and qualified for starting therapy however her insurance denied the treatment. She went back for sleep titration study that was done 02/10/2022 and came in today to discuss results however we don't have the report yet. She did not noticed a difference the day after the study. She feels tired constantly, never wakes up feeling rested. She takes a lot of caffeine to stay alert . We  tried Provigil and took it for 2 weeks but cause her to feel very anxious and irritability .  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   Anxiety 12/29/2020   Cervical spondylosis with myelopathy and radiculopathy 12/11/2020   Cervical stenosis of spinal canal 12/11/2020   Chronic pain 12/06/2020   Cervical spinal stenosis 05/05/2020   Bulging of cervical intervertebral disc 05/05/2020   Cervical radiculopathy 05/05/2020   Bipolar 2 disorder ([place]) 12/15/2018   Fever blister 07/29/2016   Menorrhagia with irregular cycle 06/20/2016   OSA (obstructive sleep apnea) 05/11/2016   Hypersomnia 12/25/2015   Snoring 12/25/2015   Cluster B personality disorder ([place]) 10/27/2015   H/O urinary tract infection 06/21/2015   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   Depression, major, recurrent, moderate ([place]) 06/21/2015   Gastroparesis 06/21/2015   Hx of cold sores 06/21/2015  Generalized anxiety disorder 05/31/2015   History of pyelonephritis 05/30/2015   Dysmenorrhea 05/30/2015   History of asthma 05/30/2015   Migraine without aura and responsive to treatment 05/30/2015   Allergic rhinitis, seasonal 05/30/2015   Vertigo 05/30/2015   IBS (irritable bowel syndrome) 09/20/2013   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   H/O cervical spine surgery 12/29/2012   Vitamin D deficiency 11/23/2009   History of mononucleosis 04/03/2009    Past Surgical History:  Procedure Laterality Date   ADENOIDECTOMY      anterior cervical decompression  Left 12/12/2020   revision with removal of hardware and decompression  - done at [place] [facility] 04/30/2021   Procedure: COLONOSCOPY WITH PROPOFOL;  Surgeon: Lin Landsman, MD;  Location: [facility] ENDOSCOPY;  Service: Gastroenterology;  Laterality: N/A;   TONSILLECTOMY     TYMPANOSTOMY TUBE PLACEMENT      Family History  Problem Relation Age of Onset   Depression Mother    Hypertension Mother        controlled   Allergic rhinitis Mother    Anxiety disorder Mother    Diabetes Father        controlled   Hypertension Father    Allergic rhinitis Father    Depression Father    Depression Brother    Anxiety disorder Brother     Social History   Tobacco Use   Smoking status: Never   Smokeless tobacco: Never  Substance Use Topics   Alcohol use: Not Currently    Alcohol/week: 0.0 standard drinks of alcohol     Current Outpatient Medications:    albuterol (VENTOLIN HFA) 108 (90 Base) MCG/ACT inhaler, Inhale 2 puffs into the lungs every 4 (four) hours as needed for wheezing or shortness of breath., Disp: 18 g, Rfl: 0   amitriptyline (ELAVIL) 25 MG tablet, Take 25 mg by mouth at bedtime., Disp: , Rfl:    budesonide-formoterol (SYMBICORT) 160-4.5 MCG/ACT inhaler, Inhale 2 puffs into the lungs 2 (two) times daily., Disp: 3 each, Rfl: 3   clonazePAM (KLONOPIN) 0.5 MG tablet, Take 1 tablet (0.5 mg total) by mouth 2 (two) times daily., Disp: 60 tablet, Rfl: 4   famotidine (PEPCID) 10 MG tablet, Take by mouth., Disp: , Rfl:    fluticasone (FLONASE) 50 MCG/ACT nasal spray, Place into both nostrils daily., Disp: , Rfl:    indomethacin (INDOCIN) 25 MG capsule, Take 1 tablet PO Q8H x 2 weeks, the 2 PO Q8H x 2 weeks, then 3 PO Q8H. DO NOT CRUSH. TAKE WITH FOOD., Disp: , Rfl:    lamoTRIgine (LAMICTAL) 200 MG tablet, Take 200 mg by mouth daily., Disp: , Rfl:    levocetirizine (XYZAL) 5 MG tablet, Take  by mouth., Disp: , Rfl:    montelukast (SINGULAIR) 10 MG tablet, Take 1 tablet (10 mg total) by mouth every evening., Disp: 90 tablet, Rfl: 1   norethindrone (AYGESTIN) 5 MG tablet, Take 1 tablet (5 mg total) by mouth daily., Disp: 28 tablet, Rfl: 0   ondansetron (ZOFRAN-ODT) 8 MG disintegrating tablet, Take 1 tablet (8 mg total) by mouth every 8 (eight) hours as needed for nausea or vomiting., Disp: 20 tablet, Rfl: 6   Semaglutide-Weight Management (WEGOVY) 2.4 MG/0.75ML SOAJ, Inject 2.4 mg into the skin once a week., Disp: 3 mL, Rfl: 1   sertraline (ZOLOFT) 100 MG tablet, Take 100 mg by mouth daily., Disp: , Rfl:    SUMAtriptan (IMITREX) 100 MG  tablet, , Disp: , Rfl:    Ubrogepant (UBRELVY) 100 MG TABS, Take 100 mg by mouth as needed. May repeat a dose in 2 hours if needed. Max dose 2 pills in 24 hours, Disp: 16 tablet, Rfl: 6   valACYclovir (VALTREX) 500 MG tablet, Take 1 tablet (500 mg total) by mouth daily., Disp: 90 tablet, Rfl: 1  Allergies  Allergen Reactions   Apple Itching and Swelling    ONLY certain "FRESH FRUITS" cause throat itches & in spring, throat feels like it will swell.  (apples, peaches, pears, plums, mango)   Prednisone Other (See Comments) and Palpitations    Severe mood swings and tachycardia   Amoxicillin-Pot Clavulanate Other (See Comments)    Mood swings Mood swings Mood swings   Tape Itching    I personally reviewed active problem list, medication list, allergies, family history, social history, health maintenance with the patient/caregiver today.   ROS  ***  Objective  There were no vitals filed for this visit.  There is no height or weight on file to calculate BMI.  Physical Exam ***  Recent Results (from the past 2160 hour(s))  Anatomic Pathology Report     Status: None   Collection Time: 05/08/22 12:00 AM  Result Value Ref Range   Diagnosis synopsis: Comment     Comment: Specimen A-Skin Excision, left upper back: KELOID.   Specimen:  Comment     Comment: Specimen A-Skin Excision, left upper back   Clinical diagnosis: Comment     Comment: Specimen A-cyst vs other   Diagnosis: Comment     Comment: Specimen A-KELOID.   Microscopic description: Comment     Comment: Specimen A-The tissue demonstrates a nodular scar with large eosinophilic collagen bundles.    Gross description: Comment     Comment: The specimen is received in formalin labeled "Kersh, Camera, left upper back. Received is a 1.7 x 0.8 x 1.5 cm excision of tan skin with attached fatty tissue. No cystic space is grossly noted. The specimen is serially sectioned and entirely submitted in cassette 1.    Electronically signed by: Comment     Comment: Kathrynn Ducking, MD Board Certified Dermatopathologist and Cytopathologist    CPT code(s): Comment     Comment: Specimen [phone]   CPT Disclaimer: Comment     Comment: CPT codes, as published by the AMA, are provided for informational purpose only as the assignment of the CPT codes is the responsibility of the billing party.    Clinician provided ICD: D49.2    Pathologist provided ICD: L91.0   Specimen status report     Status: None   Collection Time: 05/08/22 12:00 AM  Result Value Ref Range   specimen status report Comment     Comment: Please note Please note The date and/or time of collection was not indicated on the requisition as required by state and federal law.  The date of receipt of the specimen was used as the collection date if not supplied.     PHQ2/9:    06/07/2022    9:02 AM 05/08/2022    1:20 PM 03/18/2022    8:01 AM 02/21/2022   10:33 AM 10/26/2021    2:16 PM  Depression screen PHQ 2/9  Decreased Interest '1 1 1 2 2  '$ Down, Depressed, Hopeless '1 1 1 1 1  '$ PHQ - 2 Score '2 2 2 3 3  '$ Altered sleeping 3 3 0 3 0  Tired, decreased energy 3 3 0 0 3  Change in appetite 0 0 0 0 0  Feeling bad or failure about yourself  0 1 1 0 0  Trouble concentrating 0 0 0 3 0  Moving slowly or  fidgety/restless 0 0 0 0 0  Suicidal thoughts 0 0 0 0 0  PHQ-9 Score '8 9 3 9 6  '$ Difficult doing work/chores     Not difficult at all    phq 9 is {gen pos [id number]}   Fall Risk:    06/07/2022    8:39 AM 05/08/2022    1:12 PM 03/18/2022    8:00 AM 02/21/2022   10:33 AM 10/26/2021    2:16 PM  Fall Risk   Falls in the past year? 0 0 0 0 0  Number falls in past yr: 0 0 0 0 0  Injury with Fall? 0 0 0 0 0  Risk for fall due to : No Fall Risks No Fall Risks No Fall Risks No Fall Risks No Fall Risks  Follow up Falls prevention discussed Falls prevention discussed Falls prevention discussed Falls prevention discussed Falls prevention discussed      Functional Status Survey:      Assessment & Plan  *** There are no diagnoses linked to this encounter.

## 2022-07-29 ENCOUNTER — Encounter: Payer: Self-pay | Admitting: Family Medicine

## 2022-07-29 ENCOUNTER — Ambulatory Visit: Payer: PRIVATE HEALTH INSURANCE | Admitting: Dermatology

## 2022-07-29 ENCOUNTER — Ambulatory Visit (INDEPENDENT_AMBULATORY_CARE_PROVIDER_SITE_OTHER): Payer: PRIVATE HEALTH INSURANCE | Admitting: Family Medicine

## 2022-07-29 VITALS — BP 116/72 | HR 92 | Temp 98.3°F | Resp 16 | Ht 65.0 in | Wt 314.3 lb

## 2022-07-29 DIAGNOSIS — G43009 Migraine without aura, not intractable, without status migrainosus: Secondary | ICD-10-CM

## 2022-07-29 DIAGNOSIS — Z6841 Body Mass Index (BMI) 40.0 and over, adult: Secondary | ICD-10-CM

## 2022-07-29 DIAGNOSIS — R400 Somnolence: Secondary | ICD-10-CM

## 2022-07-29 DIAGNOSIS — J4540 Moderate persistent asthma, uncomplicated: Secondary | ICD-10-CM | POA: Diagnosis not present

## 2022-07-29 DIAGNOSIS — G4733 Obstructive sleep apnea (adult) (pediatric): Secondary | ICD-10-CM

## 2022-07-29 DIAGNOSIS — Z23 Encounter for immunization: Secondary | ICD-10-CM

## 2022-07-29 DIAGNOSIS — Z9989 Dependence on other enabling machines and devices: Secondary | ICD-10-CM

## 2022-07-29 MED ORDER — LEVALBUTEROL TARTRATE 45 MCG/ACT IN AERO: 2.0000 | INHALATION_SPRAY | Freq: Four times a day (QID) | RESPIRATORY_TRACT | 1 refills | Status: DC

## 2022-07-29 MED ORDER — WEGOVY 2.4 MG/0.75ML ~~LOC~~ SOAJ: 2.4000 mg | SUBCUTANEOUS | 3 refills | Status: DC

## 2022-07-29 MED ORDER — SUNOSI 75 MG PO TABS: 1.0000 | ORAL_TABLET | ORAL | 0 refills | Status: DC

## 2022-07-29 MED ORDER — BUDESONIDE-FORMOTEROL FUMARATE 160-4.5 MCG/ACT IN AERO: 2.0000 | INHALATION_SPRAY | Freq: Two times a day (BID) | RESPIRATORY_TRACT | 0 refills | Status: DC

## 2022-07-30 ENCOUNTER — Encounter: Payer: Self-pay | Admitting: Family Medicine

## 2022-08-14 ENCOUNTER — Ambulatory Visit (INDEPENDENT_AMBULATORY_CARE_PROVIDER_SITE_OTHER): Payer: PRIVATE HEALTH INSURANCE | Admitting: Dermatology

## 2022-08-14 ENCOUNTER — Encounter: Payer: Self-pay | Admitting: Dermatology

## 2022-08-14 DIAGNOSIS — L578 Other skin changes due to chronic exposure to nonionizing radiation: Secondary | ICD-10-CM

## 2022-08-14 DIAGNOSIS — E559 Vitamin D deficiency, unspecified: Secondary | ICD-10-CM

## 2022-08-14 DIAGNOSIS — D229 Melanocytic nevi, unspecified: Secondary | ICD-10-CM

## 2022-08-14 DIAGNOSIS — L821 Other seborrheic keratosis: Secondary | ICD-10-CM

## 2022-08-14 DIAGNOSIS — L814 Other melanin hyperpigmentation: Secondary | ICD-10-CM | POA: Diagnosis not present

## 2022-08-14 DIAGNOSIS — Z1283 Encounter for screening for malignant neoplasm of skin: Secondary | ICD-10-CM

## 2022-08-14 DIAGNOSIS — D1801 Hemangioma of skin and subcutaneous tissue: Secondary | ICD-10-CM

## 2022-08-14 DIAGNOSIS — Z808 Family history of malignant neoplasm of other organs or systems: Secondary | ICD-10-CM

## 2022-08-14 NOTE — Progress Notes (Signed)
   Follow-Up Visit   Subjective  Tracey Perry is a 31 y.o. female who presents for the following: Annual Exam (Tbse. Family h/o skin cancer in maternal grandmother with bcc and scc. Spot at left shoulder. ).  The patient presents for Total-Body Skin Exam (TBSE) for skin cancer screening and mole check.  The patient has spots, moles and lesions to be evaluated, some may be new or changing and the patient has concerns that these could be cancer.  The following portions of the chart were reviewed this encounter and updated as appropriate:  Tobacco  Allergies  Meds  Problems  Med Hx  Surg Hx  Fam Hx      Review of Systems: No other skin or systemic complaints except as noted in HPI or Assessment and Plan.   Objective  Well appearing patient in no apparent distress; mood and affect are within normal limits.  A full examination was performed including scalp, head, eyes, ears, nose, lips, neck, chest, axillae, abdomen, back, buttocks, bilateral upper extremities, bilateral lower extremities, hands, feet, fingers, toes, fingernails, and toenails. All findings within normal limits unless otherwise noted below.   Assessment & Plan  Lentigines - Scattered tan macules - Due to sun exposure - Benign-appearing, observe - Recommend daily broad spectrum sunscreen SPF 30+ to sun-exposed areas, reapply every 2 hours as needed. - Call for any changes  Seborrheic Keratoses - Stuck-on, waxy, tan-brown papules and/or plaques  - Benign-appearing - Discussed benign etiology and prognosis. - Observe - Call for any changes  Melanocytic Nevi - Tan-brown and/or pink-flesh-colored symmetric macules and papules - Benign appearing on exam today - Observation - Call clinic for new or changing moles - Recommend daily use of broad spectrum spf 30+ sunscreen to sun-exposed areas.   Hemangiomas - Red papules - Discussed benign nature - Observe - Call for any changes  Actinic Damage -  Chronic condition, secondary to cumulative UV/sun exposure - diffuse scaly erythematous macules with underlying dyspigmentation - Recommend daily broad spectrum sunscreen SPF 30+ to sun-exposed areas, reapply every 2 hours as needed.  - Staying in the shade or wearing long sleeves, sun glasses (UVA+UVB protection) and wide brim hats (4-inch brim around the entire circumference of the hat) are also recommended for sun protection.  - Call for new or changing lesions.  Family history of skin cancer - what type(s): bcc and scc  - who affected: maternal grandmother   Vitamin D deficiency - Patient notes she has a history of vitamin D deficiency and is unable to tolerate vitamin D pills due to stomach issues - suggest trying vitamin D spray or skin patch  Skin cancer screening performed today. Return for 2-3 year tbse. I, Ruthell Rummage, CMA, am acting as scribe for Forest Gleason, MD.  Documentation: I have reviewed the above documentation for accuracy and completeness, and I agree with the above.  Forest Gleason, MD

## 2022-08-14 NOTE — Patient Instructions (Addendum)
Recommend taking Heliocare sun protection supplement daily in sunny weather for additional sun protection. For maximum protection on the sunniest days, you can take up to 2 capsules of regular Heliocare OR take 1 capsule of Heliocare Ultra. For prolonged exposure (such as a full day in the sun), you can repeat your dose of the supplement 4 hours after your first dose. Heliocare can be purchased at [facility], at some [facility] or at [email or web address].     Melanoma ABCDEs  Melanoma is the most dangerous type of skin cancer, and is the leading cause of death from skin disease.  You are more likely to develop melanoma if you: Have light-colored skin, light-colored eyes, or red or blond hair Spend a lot of time in the sun Tan regularly, either outdoors or in a tanning bed Have had blistering sunburns, especially during childhood Have a close family member who has had a melanoma Have atypical moles or large birthmarks  Early detection of melanoma is key since treatment is typically straightforward and cure rates are extremely high if we catch it early.   The first sign of melanoma is often a change in a mole or a new dark spot.  The ABCDE system is a way of remembering the signs of melanoma.  A for asymmetry:  The two halves do not match. B for border:  The edges of the growth are irregular. C for color:  A mixture of colors are present instead of an even brown color. D for diameter:  Melanomas are usually (but not always) greater than 6mm - the size of a pencil eraser. E for evolution:  The spot keeps changing in size, shape, and color.  Please check your skin once per month between visits. You can use a small mirror in front and a large mirror behind you to keep an eye on the back side or your body.   If you see any new or changing lesions before your next follow-up, please call to schedule a visit.  Please continue daily skin protection including broad spectrum sunscreen SPF 30+ to  sun-exposed areas, reapplying every 2 hours as needed when you're outdoors.   Staying in the shade or wearing long sleeves, sun glasses (UVA+UVB protection) and wide brim hats (4-inch brim around the entire circumference of the hat) are also recommended for sun protection.        Due to recent changes in healthcare laws, you may see results of your pathology and/or laboratory studies on MyChart before the doctors have had a chance to review them. We understand that in some cases there may be results that are confusing or concerning to you. Please understand that not all results are received at the same time and often the doctors may need to interpret multiple results in order to provide you with the best plan of care or course of treatment. Therefore, we ask that you please give us 2 business days to thoroughly review all your results before contacting the office for clarification. Should we see a critical lab result, you will be contacted sooner.   If You Need Anything After Your Visit  If you have any questions or concerns for your doctor, please call our main line at [phone] and press option 4 to reach your doctor's medical assistant. If no one answers, please leave a voicemail as directed and we will return your call as soon as possible. Messages left after 4 pm will be answered the following business day.   You may   also send us a message via MyChart. We typically respond to MyChart messages within 1-2 business days.  For prescription refills, please ask your pharmacy to contact our office. Our fax number is [phone].  If you have an urgent issue when the clinic is closed that cannot wait until the next business day, you can page your doctor at the number below.    Please note that while we do our best to be available for urgent issues outside of office hours, we are not available 24/7.   If you have an urgent issue and are unable to reach us, you may choose to seek medical care  at your doctor's office, retail clinic, urgent care center, or emergency room.  If you have a medical emergency, please immediately call 911 or go to the emergency department.  Pager Numbers  - Dr. Kowalski: [phone]  - Dr. Moye: [phone]  - Dr. Stewart: [phone]  In the event of inclement weather, please call our main line at [phone] for an update on the status of any delays or closures.  Dermatology Medication Tips: Please keep the boxes that topical medications come in in order to help keep track of the instructions about where and how to use these. Pharmacies typically print the medication instructions only on the boxes and not directly on the medication tubes.   If your medication is too expensive, please contact our office at [phone] option 4 or send us a message through MyChart.   We are unable to tell what your co-pay for medications will be in advance as this is different depending on your insurance coverage. However, we may be able to find a substitute medication at lower cost or fill out paperwork to get insurance to cover a needed medication.   If a prior authorization is required to get your medication covered by your insurance company, please allow us 1-2 business days to complete this process.  Drug prices often vary depending on where the prescription is filled and some pharmacies may offer cheaper prices.  The website www.goodrx.com contains coupons for medications through different pharmacies. The prices here do not account for what the cost may be with help from insurance (it may be cheaper with your insurance), but the website can give you the price if you did not use any insurance.  - You can print the associated coupon and take it with your prescription to the pharmacy.  - You may also stop by our office during regular business hours and pick up a GoodRx coupon card.  - If you need your prescription sent electronically to a different pharmacy,  notify our office through [place] MyChart or by phone at [phone] option 4.     Si Usted Necesita Algo Despus de Su Visita  Tambin puede enviarnos un mensaje a travs de MyChart. Por lo general respondemos a los mensajes de MyChart en el transcurso de 1 a 2 das hbiles.  Para renovar recetas, por favor pida a su farmacia que se ponga en contacto con nuestra oficina. Nuestro nmero de fax es el [phone].  Si tiene un asunto urgente cuando la clnica est cerrada y que no puede esperar hasta el siguiente da hbil, puede llamar/localizar a su doctor(a) al nmero que aparece a continuacin.   Por favor, tenga en cuenta que aunque hacemos todo lo posible para estar disponibles para asuntos urgentes fuera del horario de oficina, no estamos disponibles las 24 horas del da, los 7 das de la semana.   Si   tiene un problema urgente y no puede comunicarse con nosotros, puede optar por buscar atencin mdica  en el consultorio de su doctor(a), en una clnica privada, en un centro de atencin urgente o en una sala de emergencias.  Si tiene una emergencia mdica, por favor llame inmediatamente al 911 o vaya a la sala de emergencias.  Nmeros de bper  - Dr. Kowalski: [phone]  - Dra. Moye: [phone]  - Dra. Stewart: [phone]  En caso de inclemencias del tiempo, por favor llame a nuestra lnea principal al [phone] para una actualizacin sobre el estado de cualquier retraso o cierre.  Consejos para la medicacin en dermatologa: Por favor, guarde las cajas en las que vienen los medicamentos de uso tpico para ayudarle a seguir las instrucciones sobre dnde y cmo usarlos. Las farmacias generalmente imprimen las instrucciones del medicamento slo en las cajas y no directamente en los tubos del medicamento.   Si su medicamento es muy caro, por favor, pngase en contacto con nuestra oficina llamando al [phone] y presione la opcin 4 o envenos un mensaje a travs de  MyChart.   No podemos decirle cul ser su copago por los medicamentos por adelantado ya que esto es diferente dependiendo de la cobertura de su seguro. Sin embargo, es posible que podamos encontrar un medicamento sustituto a menor costo o llenar un formulario para que el seguro cubra el medicamento que se considera necesario.   Si se requiere una autorizacin previa para que su compaa de seguros cubra su medicamento, por favor permtanos de 1 a 2 das hbiles para completar este proceso.  Los precios de los medicamentos varan con frecuencia dependiendo del lugar de dnde se surte la receta y alguna farmacias pueden ofrecer precios ms baratos.  El sitio web www.goodrx.com tiene cupones para medicamentos de diferentes farmacias. Los precios aqu no tienen en cuenta lo que podra costar con la ayuda del seguro (puede ser ms barato con su seguro), pero el sitio web puede darle el precio si no utiliz ningn seguro.  - Puede imprimir el cupn correspondiente y llevarlo con su receta a la farmacia.  - Tambin puede pasar por nuestra oficina durante el horario de atencin regular y recoger una tarjeta de cupones de GoodRx.  - Si necesita que su receta se enve electrnicamente a una farmacia diferente, informe a nuestra oficina a travs de MyChart de [place] o por telfono llamando al [phone] y presione la opcin 4.  

## 2022-08-26 ENCOUNTER — Encounter: Payer: Self-pay | Admitting: Family Medicine

## 2022-08-26 ENCOUNTER — Other Ambulatory Visit: Payer: Self-pay | Admitting: Family Medicine

## 2022-09-21 ENCOUNTER — Encounter: Payer: Self-pay | Admitting: Family Medicine

## 2022-09-24 ENCOUNTER — Other Ambulatory Visit: Payer: Self-pay | Admitting: Family Medicine

## 2022-09-24 DIAGNOSIS — R400 Somnolence: Secondary | ICD-10-CM

## 2022-09-24 DIAGNOSIS — G4733 Obstructive sleep apnea (adult) (pediatric): Secondary | ICD-10-CM

## 2022-09-24 MED ORDER — SUNOSI 75 MG PO TABS: 1.0000 | ORAL_TABLET | ORAL | 0 refills | Status: DC

## 2022-10-01 ENCOUNTER — Encounter: Payer: Self-pay | Admitting: Family Medicine

## 2022-10-01 ENCOUNTER — Other Ambulatory Visit: Payer: Self-pay | Admitting: Family Medicine

## 2022-10-01 DIAGNOSIS — R0602 Shortness of breath: Secondary | ICD-10-CM

## 2022-10-17 ENCOUNTER — Ambulatory Visit: Payer: PRIVATE HEALTH INSURANCE | Admitting: Neurology

## 2022-10-17 ENCOUNTER — Telehealth: Payer: Self-pay | Admitting: Neurology

## 2022-10-17 NOTE — Telephone Encounter (Signed)
LVM and sent mychart msg informing pt of appt cancellation- Judson Roch out.

## 2022-10-24 NOTE — Progress Notes (Signed)
Acute Office Visit  Subjective:     Patient ID: Tracey Perry, female    DOB: 1991-07-12, 31 y.o.   MRN: [id number]  Chief Complaint  Patient presents with   Sinusitis    HPI Patient is in today for continued symptoms after sinus infection. Was seen in Urgent Care 10/16/22 and diagnosed with sinusitis and treated with Cefdinir 300 mg BID x 7 days. Today she states her symptoms have been going on since the beginning of November. She does have asthma and is currently on Symbicort and Albuterol PRN. She had asthma in childhood that was controlled but then she had COVID last year which flared her symptoms. She is now experiencing mostly dry but sometimes productive cough, worse at night. She also has shortness of breath and chest tightness.  She denies fevers, wheezing, chest congestion.  She has some slight nasal congestion but no rhinorrhea.  Does endorse postnasal drip and sore throat as well as hoarseness occasionally.  No other sinus pain pressure ear pain or ear pressure symptoms.  She does take Flonase, Xyzal and Singulair for allergies.  She is also taking Robitussin, Sudafed, Mucinex and ibuprofen over-the-counter for her symptoms.  She does follow with [facility] pulmonology.  She recently had a high-resolution chest CT on 09/24/2022, showed no evidence of interstitial lung disease but with mild air trapping indicative of small airway disease.  Review of Systems  Constitutional:  Negative for chills and fever.  HENT:  Positive for congestion and sore throat. Negative for ear pain and sinus pain.   Respiratory:  Positive for cough, sputum production and shortness of breath. Negative for wheezing.   Cardiovascular:  Negative for chest pain.  Musculoskeletal:  Positive for back pain.  Neurological:  Negative for headaches.        Objective:    BP 126/78   Pulse 91   Temp 98.5 F (36.9 C)   Resp 18   Ht '5\' 5"'$  (1.651 m)   Wt (!) 303 lb 6.4 oz (137.6 kg)    SpO2 98%   BMI 50.49 kg/m  BP Readings from Last 3 Encounters:  10/25/22 126/78  07/29/22 116/72  07/17/22 114/75   Wt Readings from Last 3 Encounters:  10/25/22 (!) 303 lb 6.4 oz (137.6 kg)  07/29/22 (!) 314 lb 4.8 oz (142.6 kg)  07/17/22 (!) 317 lb 6 oz (144 kg)    Physical Exam Constitutional:      Appearance: Normal appearance. She is obese.  HENT:     Head: Normocephalic and atraumatic.     Right Ear: Tympanic membrane, ear canal and external ear normal.     Left Ear: Tympanic membrane, ear canal and external ear normal.     Nose: Nose normal.     Mouth/Throat:     Mouth: Mucous membranes are moist.     Pharynx: Oropharynx is clear.  Eyes:     Conjunctiva/sclera: Conjunctivae normal.  Cardiovascular:     Rate and Rhythm: Normal rate and regular rhythm.  Pulmonary:     Effort: Pulmonary effort is normal.     Breath sounds: Normal breath sounds. No wheezing, rhonchi or rales.     Comments: Decreased air movement throughout Skin:    General: Skin is warm and dry.  Neurological:     General: No focal deficit present.     Mental Status: She is alert. Mental status is at baseline.  Psychiatric:        Mood and  Affect: Mood normal.        Behavior: Behavior normal.     No results found for any visits on 10/25/22.      Assessment & Plan:   1. Intermittent asthma with acute exacerbation, unspecified asthma severity/Subacute cough: Acute bacterial sinusitis already treated with cefdinir, nasal congestion and sinus pain symptoms have resolved, however nocturnal cough and shortness of breath continuing.  I do feel like this is an exacerbation of her asthma.  She cannot take oral steroids as it makes her mood swings worse.  She was given a sample of Breztri to use instead of the Symbicort as well as a breathing machine to do DuoNeb breathing treatments every 2-4 hours as needed for cough and wheezing.  She recently had a chest CT at the beginning of November but this was  prior to her symptoms really starting.  We will obtain a chest x-ray today to make sure there is no overlapping pneumonia.  She will continue her other allergy and asthma treatment and follow-up here at the beginning of January as previously scheduled.  - DG Chest 2 View; Future - ipratropium-albuterol (DUONEB) 0.5-2.5 (3) MG/3ML SOLN; Take 3 mLs by nebulization every 4 (four) hours as needed.  Dispense: 360 mL; Refill: 2 - For home use only DME Other see comment   Return for Already scheduled.  Teodora Medici, DO

## 2022-10-25 ENCOUNTER — Encounter: Payer: Self-pay | Admitting: Internal Medicine

## 2022-10-25 ENCOUNTER — Ambulatory Visit
Admission: RE | Admit: 2022-10-25 | Discharge: 2022-10-25 | Disposition: A | Discharge status: Home / Self Care | Payer: PRIVATE HEALTH INSURANCE | Source: Ambulatory Visit | Attending: Internal Medicine | Admitting: Internal Medicine

## 2022-10-25 ENCOUNTER — Ambulatory Visit
Admission: RE | Admit: 2022-10-25 | Discharge: 2022-10-25 | Disposition: A | Discharge status: Home / Self Care | Payer: PRIVATE HEALTH INSURANCE | Attending: Internal Medicine | Admitting: Internal Medicine

## 2022-10-25 ENCOUNTER — Ambulatory Visit: Payer: PRIVATE HEALTH INSURANCE | Admitting: Internal Medicine

## 2022-10-25 VITALS — BP 126/78 | HR 91 | Temp 98.5°F | Resp 18 | Ht 65.0 in | Wt 303.4 lb

## 2022-10-25 DIAGNOSIS — R052 Subacute cough: Secondary | ICD-10-CM | POA: Diagnosis not present

## 2022-10-25 DIAGNOSIS — J4521 Mild intermittent asthma with (acute) exacerbation: Secondary | ICD-10-CM

## 2022-10-25 MED ORDER — IPRATROPIUM-ALBUTEROL 0.5-2.5 (3) MG/3ML IN SOLN: 3.0000 mL | RESPIRATORY_TRACT | 2 refills | Status: AC | PRN

## 2022-10-29 ENCOUNTER — Ambulatory Visit: Payer: Self-pay

## 2022-10-29 NOTE — Telephone Encounter (Signed)
Message from Roslynn Amble sent at 10/29/2022  8:41 AM EST  Summary: Cough, congestion   The patient still has a lingering cough, congestion, sinus pressure and slight fever. She has already finished an antibiotic which temporarily helped but it all came back. Please assist patient further        Cough- phlegm swallows Nasal congestion yellow No fever 99.8 Sore throat and sinus pressure. OTC Robitussin, Sudafed, Mucinex, Ibuprofen Sx returned Sunday.  SOB ongoing. Feels like she never took abx.  Chief Complaint: cough Symptoms: sinus pressure and congestion, yellow nasal discharge, productive cough, 99.8 temp, sore throat, mild SOB (pt stated that sx is ongoing from when first sick Frequency: seen in office on 10/25/22 Pertinent Negatives: Patient denies chest pain,  Disposition: '[]'$ ED /'[]'$ Urgent Care (no appt availability in office) / '[]'$ Appointment(In office/virtual)/ '[]'$  [facility]/ '[]'$ Home Care/ '[]'$ Refused Recommended Disposition /'[]'$ [facility]/ '[x]'$  Follow-up with PCP Additional Notes: sending message for PCP to triage. Pt was in office last week    Reason for Disposition  [1] Fever returns after gone for over 24 hours AND [2] symptoms worse or not improved  Answer Assessment - Initial Assessment Questions 1. ONSET: "When did the cough begin?"      Sx returned Sunday 2. SEVERITY: "How bad is the cough today?"      sporadic 3. SPUTUM: "Describe the color of your sputum" (none, dry cough; clear, white, yellow, green)     Pt swallows 4. HEMOPTYSIS: "Are you coughing up any blood?" If so ask: "How much?" (flecks, streaks, tablespoons, etc.)     N/a 5. DIFFICULTY BREATHING: "Are you having difficulty breathing?" If Yes, ask: "How bad is it?" (e.g., mild, moderate, severe)    - MILD: No SOB at rest, mild SOB with walking, speaks normally in sentences, can lie down, no retractions, pulse < 100.    - MODERATE: SOB at rest, SOB with minimal exertion and prefers to  sit, cannot lie down flat, speaks in phrases, mild retractions, audible wheezing, pulse 100-120.    - SEVERE: Very SOB at rest, speaks in single words, struggling to breathe, sitting hunched forward, retractions, pulse > 120      Mild (ongoing) 6. FEVER: "Do you have a fever?" If Yes, ask: "What is your temperature, how was it measured, and when did it start?"     no 7. CARDIAC HISTORY: "Do you have any history of heart disease?" (e.g., heart attack, congestive heart failure)      no 8. LUNG HISTORY: "Do you have any history of lung disease?"  (e.g., pulmonary embolus, asthma, emphysema)     asthma 9. PE RISK FACTORS: "Do you have a history of blood clots?" (or: recent major surgery, recent prolonged travel, bedridden)     N/a 10. OTHER SYMPTOMS: "Do you have any other symptoms?" (e.g., runny nose, wheezing, chest pain)       Sinus congestion and pressure, yellow nasal discharge, 99.8 temp., sore throat 11. PREGNANCY: "Is there any chance you are pregnant?" "When was your last menstrual period?"       N/a 12. TRAVEL: "Have you traveled out of the country in the last month?" (e.g., travel history, exposures)       N/a  Protocols used: Cough - Acute Productive-A-AH

## 2022-10-29 NOTE — Telephone Encounter (Signed)
This encounter was created in error - please disregard.

## 2022-10-29 NOTE — Telephone Encounter (Signed)
Pt called back requesting appt. Pt stated she is feeling worse as the day wears on. Advised pt to go to UC or MyChart Virtual UC visit. Pt stated she will go to UC.

## 2022-11-19 ENCOUNTER — Encounter: Payer: Self-pay | Admitting: Neurology

## 2022-11-19 ENCOUNTER — Ambulatory Visit: Payer: PRIVATE HEALTH INSURANCE | Admitting: Neurology

## 2022-11-19 VITALS — BP 104/69 | HR 73 | Ht 65.0 in | Wt 302.0 lb

## 2022-11-19 DIAGNOSIS — G4733 Obstructive sleep apnea (adult) (pediatric): Secondary | ICD-10-CM | POA: Diagnosis not present

## 2022-11-19 DIAGNOSIS — G43009 Migraine without aura, not intractable, without status migrainosus: Secondary | ICD-10-CM
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
## 2022-11-19 NOTE — Patient Instructions (Signed)
I will refer you to our sleep doctors  Continue the migraine medications as is

## 2022-11-25 NOTE — Progress Notes (Unsigned)
Name: Tracey Perry   MRN: [id number]    DOB: 09/02/1991   Date:11/26/2022       Progress Note  Subjective  Chief Complaint  Follow Up  HPI  Migraine headaches: She states migraine is stable, episodes a few times a month but when severe needs to go to work late or leave. She states she has photophobia and phonophobia and needs to be a in a quiet room. Sometimes unable to drive or function when migraine is present  Daytime somnolence even on CPAP, neurologist asked for Thyroid panel and will send her to sleep sub-specialist   Asthma Moderate  persistent : she continues to have increase in sob even though she is losing weight, no cough or wheezing, Currently on symbicort and singulair daily , saw pulmonologist and evaluation was negative - CT showed mild air trapping , she was referred to cardiologist   Obesity: she started on Wegovy back in April 2023 , start weight was 331 lbs, today is down to 302 lbs she has lost 10 % of her original weight. . She is tolerating medication well, she states she dropped one pant size, down from size 26 to 22-24  clothes are fitting loose  She has side effects of constipation but has controlled it with a stool softener 3 days a week  Continue current dose   OSA: she had initial study in 2017 and qualified for starting therapy however her insurance denied the treatment. She went back for sleep titration study that was done 02/10/2022 and came in today to discuss results however we don't have the report yet. She did not noticed a difference the day after the study. She feels tired constantly, never wakes up feeling rested.  We tried Provigil and took it for 2 weeks but cause her to feel very anxious and irritability , she tried [facility] for about 6 weeks but it caused irritability and did not help with daytime somnolence. She is no longer drinking caffeine to stay awake due to increase in anxiety   Herpes type 1: doing well on prn valtrex    Bipolar disorder: she is  feeling much better ,, stable on current dose of medications. She still sees psychiatrist   Patient Active Problem List   Diagnosis Date Noted   Moderate persistent allergic asthma without complication 40/98/1191   Morbid obesity with BMI of 50.0-59.9, adult ([place]) 05/08/2022   Episodic tension-type headache, not intractable 04/25/2021   Idiopathic stabbing headache 04/25/2021   Anxiety 12/29/2020   Cervical spondylosis with myelopathy and radiculopathy 12/11/2020   Cervical stenosis of spinal canal 12/11/2020   Chronic pain 12/06/2020   Cervical spinal stenosis 05/05/2020   Bulging of cervical intervertebral disc 05/05/2020   Cervical radiculopathy 05/05/2020   Bipolar 2 disorder ([place]) 12/15/2018   Fever blister 07/29/2016   Menorrhagia with irregular cycle 06/20/2016   OSA (obstructive sleep apnea) 05/11/2016   Hypersomnia 12/25/2015   Cluster B personality disorder ([place]) 10/27/2015   H/O urinary tract infection 06/21/2015   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   Depression, major, recurrent, moderate ([place]) 06/21/2015   Gastroparesis 06/21/2015   Hx of cold sores 06/21/2015   Generalized anxiety disorder 05/31/2015   History of pyelonephritis 05/30/2015   Dysmenorrhea 05/30/2015   History of asthma 05/30/2015   Migraine without aura and responsive to treatment 05/30/2015   Allergic rhinitis, seasonal 05/30/2015   Vertigo 05/30/2015   IBS (irritable bowel syndrome) 09/20/2013   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   H/O cervical spine surgery  12/29/2012   Vitamin D deficiency 11/23/2009   History of mononucleosis 04/03/2009    Past Surgical History:  Procedure Laterality Date   ADENOIDECTOMY     anterior cervical decompression  Left 12/12/2020   revision with removal of hardware and decompression  - done at [facility] N/A 04/30/2021   Procedure: COLONOSCOPY WITH PROPOFOL;  Surgeon: Lin Landsman, MD;  Location:  [facility];  Service: Gastroenterology;  Laterality: N/A;   TONSILLECTOMY     TYMPANOSTOMY TUBE PLACEMENT      Family History  Problem Relation Age of Onset   Depression Mother    Hypertension Mother        controlled   Allergic rhinitis Mother    Anxiety disorder Mother    Diabetes Father        controlled   Hypertension Father    Allergic rhinitis Father    Depression Father    Depression Brother    Anxiety disorder Brother     Social History   Tobacco Use   Smoking status: Never   Smokeless tobacco: Never  Substance Use Topics   Alcohol use: Not Currently    Alcohol/week: 0.0 standard drinks of alcohol     Current Outpatient Medications:    amitriptyline (ELAVIL) 25 MG tablet, Take 25 mg by mouth at bedtime., Disp: , Rfl:    budesonide-formoterol (SYMBICORT) 160-4.5 MCG/ACT inhaler, Inhale 2 puffs into the lungs 2 (two) times daily., Disp: 3 each, Rfl: 0   clonazePAM (KLONOPIN) 0.5 MG tablet, Take 1 tablet (0.5 mg total) by mouth 2 (two) times daily., Disp: 60 tablet, Rfl: 4   famotidine (PEPCID) 10 MG tablet, Take by mouth., Disp: , Rfl:    fluticasone (FLONASE) 50 MCG/ACT nasal spray, Place into both nostrils daily., Disp: , Rfl:    indomethacin (INDOCIN) 25 MG capsule, Take 1 tablet PO Q8H x 2 weeks, the 2 PO Q8H x 2 weeks, then 3 PO Q8H. DO NOT CRUSH. TAKE WITH FOOD., Disp: , Rfl:    ipratropium-albuterol (DUONEB) 0.5-2.5 (3) MG/3ML SOLN, Take 3 mLs by nebulization every 4 (four) hours as needed., Disp: 360 mL, Rfl: 2   lamoTRIgine (LAMICTAL) 200 MG tablet, Take 200 mg by mouth daily., Disp: , Rfl:    levalbuterol (XOPENEX HFA) 45 MCG/ACT inhaler, Inhale 2 puffs into the lungs 4 (four) times daily., Disp: 1 each, Rfl: 1   levocetirizine (XYZAL) 5 MG tablet, Take by mouth., Disp: , Rfl:    montelukast (SINGULAIR) 10 MG tablet, Take 1 tablet (10 mg total) by mouth every evening., Disp: 90 tablet, Rfl: 1   norethindrone (AYGESTIN) 5 MG tablet, Take 1 tablet (5 mg  total) by mouth daily., Disp: 28 tablet, Rfl: 0   ondansetron (ZOFRAN-ODT) 8 MG disintegrating tablet, Take 1 tablet (8 mg total) by mouth every 8 (eight) hours as needed for nausea or vomiting., Disp: 20 tablet, Rfl: 6   propranolol ER (INDERAL LA) 160 MG SR capsule, Take 160 mg by mouth daily., Disp: , Rfl:    Semaglutide-Weight Management (WEGOVY) 2.4 MG/0.75ML SOAJ, Inject 2.4 mg into the skin once a week., Disp: 3 mL, Rfl: 3   sertraline (ZOLOFT) 100 MG tablet, Take 100 mg by mouth daily., Disp: , Rfl:    Solriamfetol HCl (SUNOSI) 75 MG TABS, Take 1 tablet by mouth every morning., Disp: 90 tablet, Rfl: 0   SUMAtriptan (IMITREX) 100 MG tablet, , Disp: ,  Rfl:    Ubrogepant (UBRELVY) 100 MG TABS, Take 100 mg by mouth as needed. May repeat a dose in 2 hours if needed. Max dose 2 pills in 24 hours, Disp: 16 tablet, Rfl: 6   valACYclovir (VALTREX) 500 MG tablet, Take 1 tablet (500 mg total) by mouth daily., Disp: 90 tablet, Rfl: 1  Allergies  Allergen Reactions   Apple Itching and Swelling    ONLY certain "FRESH FRUITS" cause throat itches & in spring, throat feels like it will swell.  (apples, peaches, pears, plums, mango)   Prednisone Other (See Comments) and Palpitations    Severe mood swings and tachycardia   Amoxicillin-Pot Clavulanate Other (See Comments)    Mood swings Mood swings Mood swings   Tape Itching    I personally reviewed active problem list, medication list, allergies, family history, social history, health maintenance with the patient/caregiver today.   ROS  Constitutional: Negative for fever or weight change.  Respiratory: Negative for cough and shortness of breath.   Cardiovascular: Negative for chest pain or palpitations.  Gastrointestinal: Negative for abdominal pain, no bowel changes.  Musculoskeletal: Negative for gait problem or joint swelling.  Skin: Negative for rash.  Neurological: Negative for dizziness or headache.  No other specific complaints in a  complete review of systems (except as listed in HPI above).   Objective  Vitals:   11/26/22 0743  BP: 114/68  Pulse: 94  Resp: 16  SpO2: 97%  Weight: (!) 302 lb (137 kg)  Height: '5\' 5"'$  (1.651 m)    Body mass index is 50.26 kg/m.  Physical Exam  Constitutional: Patient appears well-developed and well-nourished. Obese  No distress.  HEENT: head atraumatic, normocephalic, pupils equal and reactive to light, ears normal TM, neck supple, throat within normal limits Cardiovascular: Normal rate, regular rhythm and normal heart sounds.  No murmur heard. No BLE edema. Pulmonary/Chest: Effort normal and breath sounds normal. No respiratory distress. Abdominal: Soft.  There is no tenderness. Psychiatric: Patient has a normal mood and affect. behavior is normal. Judgment and thought content normal.    PHQ2/9:    11/26/2022    7:43 AM 10/25/2022    8:55 AM 07/29/2022    1:33 PM 06/07/2022    9:02 AM 05/08/2022    1:20 PM  Depression screen PHQ 2/9  Decreased Interest 0 0 '1 1 1  '$ Down, Depressed, Hopeless 0 0 '1 1 1  '$ PHQ - 2 Score 0 0 '2 2 2  '$ Altered sleeping 3 0 '3 3 3  '$ Tired, decreased energy 3 0 '3 3 3  '$ Change in appetite 0 0 2 0 0  Feeling bad or failure about yourself  0 0 0 0 1  Trouble concentrating 0 0 1 0 0  Moving slowly or fidgety/restless 0 0 0 0 0  Suicidal thoughts 0 0 0 0 0  PHQ-9 Score 6 0 '11 8 9  '$ Difficult doing work/chores  Not difficult at all Somewhat difficult      phq 9 is negative   Fall Risk:    11/26/2022    7:42 AM 10/25/2022    8:55 AM 07/29/2022    1:23 PM 06/07/2022    8:39 AM 05/08/2022    1:12 PM  Fall Risk   Falls in the past year? 0 0 0 0 0  Number falls in past yr: 0 0  0 0  Injury with Fall? 0 0  0 0  Risk for fall due to : No Fall Risks  No Fall Risks No Fall Risks No Fall Risks  Follow up Falls prevention discussed  Falls prevention discussed Falls prevention discussed Falls prevention discussed      Functional Status Survey: Is the patient  deaf or have difficulty hearing?: No Does the patient have difficulty seeing, even when wearing glasses/contacts?: No Does the patient have difficulty concentrating, remembering, or making decisions?: No Does the patient have difficulty walking or climbing stairs?: No Does the patient have difficulty dressing or bathing?: No Does the patient have difficulty doing errands alone such as visiting a doctor's office or shopping?: No    Assessment & Plan  1. Morbid obesity with BMI of 50.0-59.9, adult ([place])  Responding well to medication  - Semaglutide-Weight Management (WEGOVY) 2.4 MG/0.75ML SOAJ; Inject 2.4 mg into the skin once a week.  Dispense: 3 mL; Refill: 3  2. Bipolar affective disorder, currently depressed, mild ([facility])  Stable   3. Moderate persistent asthma without complication  - montelukast (SINGULAIR) 10 MG tablet; Take 1 tablet (10 mg total) by mouth every evening.  Dispense: 90 tablet; Refill: 1 - budesonide-formoterol (SYMBICORT) 160-4.5 MCG/ACT inhaler; Inhale 2 puffs into the lungs 2 (two) times daily.  Dispense: 3 each; Refill: 1  4. Seasonal affective disorder ([place])  Under the care of psychiatrist   5. B12 deficiency  - CBC with Differential/Platelet - B12 and Folate Panel  6. Migraine without aura and responsive to treatment  Seeing neurologist   7. Vestibular migraine   8. OSA on CPAP   9. Other fatigue  - COMPLETE METABOLIC PANEL WITH GFR - Thyroid Panel With TSH  10. Long-term use of high-risk medication  - COMPLETE METABOLIC PANEL WITH GFR  11. Lipid screening  - Lipid panel  12. Screening for diabetes mellitus  - Hemoglobin A1c  13. Vitamin D deficiency  - VITAMIN D 25 Hydroxy (Vit-D Deficiency, Fractures)

## 2022-11-26 ENCOUNTER — Encounter: Payer: Self-pay | Admitting: Family Medicine

## 2022-11-26 ENCOUNTER — Ambulatory Visit: Payer: PRIVATE HEALTH INSURANCE | Admitting: Family Medicine

## 2022-11-26 VITALS — BP 114/68 | HR 94 | Resp 16 | Ht 65.0 in | Wt 302.0 lb

## 2022-11-26 DIAGNOSIS — Z79899 Other long term (current) drug therapy: Secondary | ICD-10-CM

## 2022-11-26 DIAGNOSIS — E559 Vitamin D deficiency, unspecified: Secondary | ICD-10-CM

## 2022-11-26 DIAGNOSIS — G4733 Obstructive sleep apnea (adult) (pediatric): Secondary | ICD-10-CM

## 2022-11-26 DIAGNOSIS — J4540 Moderate persistent asthma, uncomplicated: Secondary | ICD-10-CM | POA: Diagnosis not present

## 2022-11-26 DIAGNOSIS — Z13220 Encounter for screening for lipoid disorders: Secondary | ICD-10-CM

## 2022-11-26 DIAGNOSIS — E538 Deficiency of other specified B group vitamins: Secondary | ICD-10-CM

## 2022-11-26 DIAGNOSIS — R5383 Other fatigue: Secondary | ICD-10-CM

## 2022-11-26 DIAGNOSIS — Z6841 Body Mass Index (BMI) 40.0 and over, adult: Secondary | ICD-10-CM

## 2022-11-26 DIAGNOSIS — F338 Other recurrent depressive disorders: Secondary | ICD-10-CM | POA: Diagnosis not present

## 2022-11-26 DIAGNOSIS — G43009 Migraine without aura, not intractable, without status migrainosus: Secondary | ICD-10-CM

## 2022-11-26 DIAGNOSIS — F3131 Bipolar disorder, current episode depressed, mild: Secondary | ICD-10-CM

## 2022-11-26 DIAGNOSIS — G43809 Other migraine, not intractable, without status migrainosus: Secondary | ICD-10-CM

## 2022-11-26 DIAGNOSIS — Z131 Encounter for screening for diabetes mellitus: Secondary | ICD-10-CM

## 2022-11-26 MED ORDER — BUDESONIDE-FORMOTEROL FUMARATE 160-4.5 MCG/ACT IN AERO: 2.0000 | INHALATION_SPRAY | Freq: Two times a day (BID) | RESPIRATORY_TRACT | 1 refills | Status: DC

## 2022-11-26 MED ORDER — WEGOVY 2.4 MG/0.75ML ~~LOC~~ SOAJ: 2.4000 mg | SUBCUTANEOUS | 3 refills | Status: DC

## 2022-11-26 MED ORDER — MONTELUKAST SODIUM 10 MG PO TABS: 10.0000 mg | ORAL_TABLET | Freq: Every evening | ORAL | 1 refills | Status: DC

## 2022-11-27 LAB — THYROID PANEL WITH TSH
Free Thyroxine Index: 2.5 (ref 1.4–3.8)
T3 Uptake: 32 % (ref 22–35)
T4, Total: 7.8 ug/dL (ref 5.1–11.9)
TSH: 1.17 mIU/L

## 2022-11-27 LAB — COMPLETE METABOLIC PANEL WITH GFR
AG Ratio: 1.7 (calc) (ref 1.0–2.5)
ALT: 11 U/L (ref 6–29)
AST: 14 U/L (ref 10–30)
Albumin: 4.1 g/dL (ref 3.6–5.1)
Alkaline phosphatase (APISO): 97 U/L (ref 31–125)
BUN: 14 mg/dL (ref 7–25)
CO2: 27 mmol/L (ref 20–32)
Calcium: 9 mg/dL (ref 8.6–10.2)
Chloride: 108 mmol/L (ref 98–110)
Creat: 0.85 mg/dL (ref 0.50–0.97)
Globulin: 2.4 g/dL (calc) (ref 1.9–3.7)
Glucose, Bld: 95 mg/dL (ref 65–99)
Potassium: 4.5 mmol/L (ref 3.5–5.3)
Sodium: 141 mmol/L (ref 135–146)
Total Bilirubin: 0.3 mg/dL (ref 0.2–1.2)
Total Protein: 6.5 g/dL (ref 6.1–8.1)
eGFR: 93 mL/min/{1.73_m2} (ref >=60)

## 2022-11-27 LAB — HEMOGLOBIN A1C
Hgb A1c MFr Bld: 5.3 % of total Hgb (ref <5.7)
Mean Plasma Glucose: 105 mg/dL
eAG (mmol/L): 5.8 mmol/L

## 2022-11-27 LAB — CBC WITH DIFFERENTIAL/PLATELET
Absolute Monocytes: 426 cells/uL (ref 200–950)
Basophils Absolute: 50 cells/uL (ref 0–200)
Basophils Relative: 0.7 %
Eosinophils Absolute: 291 cells/uL (ref 15–500)
Eosinophils Relative: 4.1 %
HCT: 38.8 % (ref 35.0–45.0)
Hemoglobin: 13 g/dL (ref 11.7–15.5)
Lymphs Abs: 1456 cells/uL (ref 850–3900)
MCH: 31.9 pg (ref 27.0–33.0)
MCHC: 33.5 g/dL (ref 32.0–36.0)
MCV: 95.1 fL (ref 80.0–100.0)
MPV: 10.7 fL (ref 7.5–12.5)
Monocytes Relative: 6 %
Neutro Abs: 4878 cells/uL (ref 1500–7800)
Neutrophils Relative %: 68.7 %
Platelets: 252 10*3/uL (ref 140–400)
RBC: 4.08 10*6/uL (ref 3.80–5.10)
RDW: 12.5 % (ref 11.0–15.0)
Total Lymphocyte: 20.5 %
WBC: 7.1 10*3/uL (ref 3.8–10.8)

## 2022-11-27 LAB — LIPID PANEL
Cholesterol: 154 mg/dL (ref <200)
HDL: 43 mg/dL — ABNORMAL LOW (ref >=50)
LDL Cholesterol (Calc): 95 mg/dL (calc)
Non-HDL Cholesterol (Calc): 111 mg/dL (calc) (ref <130)
Total CHOL/HDL Ratio: 3.6 (calc) (ref <5.0)
Triglycerides: 71 mg/dL (ref <150)

## 2022-11-27 LAB — VITAMIN D 25 HYDROXY (VIT D DEFICIENCY, FRACTURES): Vit D, 25-Hydroxy: 22 ng/mL — ABNORMAL LOW (ref 30–100)

## 2022-11-27 LAB — B12 AND FOLATE PANEL
Folate: 14.2 ng/mL
Vitamin B-12: 428 pg/mL (ref 200–1100)

## 2022-12-09 ENCOUNTER — Encounter: Payer: Self-pay | Admitting: Internal Medicine

## 2022-12-09 ENCOUNTER — Encounter: Payer: Self-pay | Admitting: Neurology

## 2022-12-09 ENCOUNTER — Ambulatory Visit: Payer: PRIVATE HEALTH INSURANCE | Admitting: Internal Medicine

## 2022-12-09 VITALS — BP 126/74 | HR 94 | Temp 98.2°F | Resp 18 | Ht 65.0 in | Wt 303.2 lb

## 2022-12-09 DIAGNOSIS — G43011 Migraine without aura, intractable, with status migrainosus: Secondary | ICD-10-CM

## 2022-12-09 MED ORDER — KETOROLAC TROMETHAMINE 60 MG/2ML IM SOLN
60.0000 mg | Freq: Once | INTRAMUSCULAR | Status: AC
  Administered 2022-12-09: 60 mg via INTRAMUSCULAR

## 2022-12-09 NOTE — Patient Instructions (Addendum)
It was great seeing you today!  Plan discussed at today's visit: -Toradol injection today -Recommend Benadryl and Ubrelvy dose today as well as rest and staying hydrated -Given Neurologist a call   Follow up in: as needed  Take care and let us know if you have any questions or concerns prior to your next visit.  Dr. Rosana Berger

## 2022-12-09 NOTE — Progress Notes (Signed)
   Acute Office Visit  Subjective:     Patient ID: Tracey Perry, female    DOB: [birth year], 32 y.o.   MRN: [id number]  Chief Complaint  Patient presents with   Migraine    For 3 days     HPI Patient is in today for migraine x 3 days. Migraines usually last 4-8 hours. Not more stress. Second multiday migraine in 2 weeks. Last time was 2 weeks ago - lasted 4 days. Used Emgalitiy, caffeieine, tylenol and NSAIDs.  MIGRAINES Duration:  early Saturday morning  Onset: sudden Severity: 7/10 Quality: sharp and stabbing Frequency: constant Location: left side of face and mostly forehead and around eye Headache duration: 2 days  Radiation: yes - teeth and neck  Time of day headache occurs:  Alleviating factors: rest, being in the dark Aggravating factors: lights, loud sounds Headache status at time of visit: current headache Treatments attempted: Treatments attempted: rest, APAP, and ibuprofen [place]  Aura: no Nausea:  no Vomiting: no Photophobia:  yes Phonophobia:  yes Effect on social functioning:  yes Numbers of missed days of school/work each month:  Confusion:  no Gait disturbance/ataxia:  no Behavioral changes:  no Fevers:  no  Review of Systems  Constitutional:  Negative for chills and fever.  HENT:  Negative for sinus pain and sore throat.   Eyes:  Negative for blurred vision.  Respiratory:  Negative for shortness of breath.   Cardiovascular:  Negative for chest pain.  Gastrointestinal:  Negative for nausea and vomiting.  Neurological:  Positive for headaches.        Objective:    BP 126/74   Pulse 94   Temp 98.2 F (36.8 C)   Resp 18   Ht '5\' 5"'$  (1.651 m)   Wt (!) 303 lb 3.2 oz (137.5 kg)   SpO2 98%   BMI 50.46 kg/m  BP Readings from Last 3 Encounters:  12/09/22 126/74  11/26/22 114/68  11/19/22 104/69   Wt Readings from Last 3 Encounters:  12/09/22 (!) 303 lb 3.2 oz (137.5 kg)  11/26/22 (!) 302 lb (137 kg)  11/19/22 (!) 302 lb (137 kg)       Physical Exam Constitutional:      Appearance: Normal appearance. She is ill-appearing.  HENT:     Head: Normocephalic and atraumatic.  Eyes:     Conjunctiva/sclera: Conjunctivae normal.  Cardiovascular:     Rate and Rhythm: Normal rate and regular rhythm.  Pulmonary:     Effort: Pulmonary effort is normal.     Breath sounds: Normal breath sounds.  Skin:    General: Skin is warm and dry.  Neurological:     General: No focal deficit present.     Mental Status: She is alert. Mental status is at baseline.  Psychiatric:        Mood and Affect: Mood normal.        Behavior: Behavior normal.     No results found for any visits on 12/09/22.      Assessment & Plan:   1. Intractable migraine without aura and with status migrainosus: Patient with a multi-day headache which is the second in 2 weeks. Treat with Toradol today, Ubrelvy dose and Benadryl. Patient does have a Neurologist to discuss other migraines treatments, potentially Emgality. Follow up here as needed.   - ketorolac (TORADOL) injection 60 mg   Return if symptoms worsen or fail to improve.  Teodora Medici, DO

## 2022-12-10 ENCOUNTER — Encounter: Payer: Self-pay | Admitting: Family Medicine

## 2022-12-10 ENCOUNTER — Ambulatory Visit (INDEPENDENT_AMBULATORY_CARE_PROVIDER_SITE_OTHER): Payer: PRIVATE HEALTH INSURANCE | Admitting: Family Medicine

## 2022-12-10 VITALS — BP 128/76 | HR 90 | Temp 98.3°F | Resp 18 | Ht 65.0 in | Wt 303.7 lb

## 2022-12-10 DIAGNOSIS — G43011 Migraine without aura, intractable, with status migrainosus: Secondary | ICD-10-CM | POA: Diagnosis not present

## 2022-12-10 MED ORDER — PROMETHAZINE HCL 50 MG/ML IJ SOLN
25.0000 mg | Freq: Once | INTRAMUSCULAR | Status: AC
  Administered 2022-12-10: 25 mg via INTRAMUSCULAR

## 2022-12-10 MED ORDER — KETOROLAC TROMETHAMINE 60 MG/2ML IM SOLN
60.0000 mg | Freq: Once | INTRAMUSCULAR | Status: AC
  Administered 2022-12-10: 60 mg via INTRAMUSCULAR

## 2022-12-10 MED ORDER — NURTEC 75 MG PO TBDP: 75.0000 mg | ORAL_TABLET | ORAL | 11 refills | Status: AC | PRN

## 2022-12-10 MED ORDER — DEXAMETHASONE SODIUM PHOSPHATE 10 MG/ML IJ SOLN
10.0000 mg | Freq: Once | INTRAMUSCULAR | Status: AC
  Administered 2022-12-10: 10 mg via INTRAMUSCULAR

## 2022-12-10 MED ORDER — EMGALITY 120 MG/ML ~~LOC~~ SOAJ: 240.0000 mg | Freq: Once | SUBCUTANEOUS | 0 refills | Status: AC

## 2022-12-10 MED ORDER — EMGALITY 120 MG/ML ~~LOC~~ SOAJ: 120.0000 mg | SUBCUTANEOUS | 11 refills | Status: DC

## 2022-12-10 NOTE — Progress Notes (Signed)
Name: Tracey Perry   MRN: [id number]    DOB: 07/28/1991   Date:12/10/2022       Progress Note  Subjective  Chief Complaint  Migraines  HPI  Intractable Headache: she sees neurologist and last visit January 2 nd and on the 4th she developed a migraine that lasted days. She states this episode started 3 days ago, initially pain was on the right side, associated with nausea, photophobia and phonophobia, pain was intense at 7/10, she also has intermittent dizziness. She was seen by Dr. Rosana Berger yesterday and was given Toradol and she was able to take a nap and woke feeling better yesterday afternoon , she was able to shower and read a little but this morning headache is on left side, ice pick like,current a 8/10 stabbing and throbbing, associated with nausea, photophobia and phonophobia. Mother brought here today and can drive her home.   She contacted her neurologist and will pick up rx for Emgality - but has to wait to be delivered and needs a PA   She can take IM dexamethasone   Patient Active Problem List   Diagnosis Date Noted   B12 deficiency 11/26/2022   Moderate persistent allergic asthma without complication 50/53/9767   Morbid obesity with BMI of 50.0-59.9, adult ([place]) 05/08/2022   Episodic tension-type headache, not intractable 04/25/2021   Idiopathic stabbing headache 04/25/2021   Anxiety 12/29/2020   Cervical spondylosis with myelopathy and radiculopathy 12/11/2020   Cervical stenosis of spinal canal 12/11/2020   Chronic pain 12/06/2020   Cervical spinal stenosis 05/05/2020   Bulging of cervical intervertebral disc 05/05/2020   Cervical radiculopathy 05/05/2020   Bipolar 2 disorder ([place]) 12/15/2018   Fever blister 07/29/2016   Menorrhagia with irregular cycle 06/20/2016   OSA on CPAP 05/11/2016   Hypersomnia 12/25/2015   Cluster B personality disorder ([place]) 10/27/2015   H/O urinary tract infection 06/21/2015   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   Depression, major,  recurrent, moderate ([place]) 06/21/2015   Gastroparesis 06/21/2015   Hx of cold sores 06/21/2015   Generalized anxiety disorder 05/31/2015   History of pyelonephritis 05/30/2015   Dysmenorrhea 05/30/2015   History of asthma 05/30/2015   Migraine without aura and responsive to treatment 05/30/2015   Allergic rhinitis, seasonal 05/30/2015   Vertigo 05/30/2015   IBS (irritable bowel syndrome) 09/20/2013   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   H/O cervical spine surgery 12/29/2012   Vitamin D deficiency 11/23/2009   History of mononucleosis 04/03/2009    Past Surgical History:  Procedure Laterality Date   ADENOIDECTOMY     anterior cervical decompression  Left 12/12/2020   revision with removal of hardware and decompression  - done at [facility] N/A 04/30/2021   Procedure: COLONOSCOPY WITH PROPOFOL;  Surgeon: Lin Landsman, MD;  Location: [facility] ENDOSCOPY;  Service: Gastroenterology;  Laterality: N/A;   TONSILLECTOMY     TYMPANOSTOMY TUBE PLACEMENT      Family History  Problem Relation Age of Onset   Depression Mother    Hypertension Mother        controlled   Allergic rhinitis Mother    Anxiety disorder Mother    Diabetes Father        controlled   Hypertension Father    Allergic rhinitis Father    Depression Father    Depression Brother    Anxiety disorder Brother     Social History   Tobacco Use  Smoking status: Never   Smokeless tobacco: Never  Substance Use Topics   Alcohol use: Not Currently    Alcohol/week: 0.0 standard drinks of alcohol     Current Outpatient Medications:    amitriptyline (ELAVIL) 25 MG tablet, Take 25 mg by mouth at bedtime., Disp: , Rfl:    budesonide-formoterol (SYMBICORT) 160-4.5 MCG/ACT inhaler, Inhale 2 puffs into the lungs 2 (two) times daily., Disp: 3 each, Rfl: 1   clonazePAM (KLONOPIN) 0.5 MG tablet, Take 1 tablet (0.5 mg total) by mouth 2 (two) times daily., Disp: 60  tablet, Rfl: 4   famotidine (PEPCID) 10 MG tablet, Take by mouth., Disp: , Rfl:    fluticasone (FLONASE) 50 MCG/ACT nasal spray, Place into both nostrils daily., Disp: , Rfl:    Galcanezumab-gnlm (EMGALITY) 120 MG/ML SOAJ, Inject 240 mg into the skin once for 1 dose., Disp: 2 mL, Rfl: 0   Galcanezumab-gnlm (EMGALITY) 120 MG/ML SOAJ, Inject 120 mg into the skin every 30 (thirty) days., Disp: 1.12 mL, Rfl: 11   indomethacin (INDOCIN) 25 MG capsule, Take 1 tablet PO Q8H x 2 weeks, the 2 PO Q8H x 2 weeks, then 3 PO Q8H. DO NOT CRUSH. TAKE WITH FOOD., Disp: , Rfl:    ipratropium-albuterol (DUONEB) 0.5-2.5 (3) MG/3ML SOLN, Take 3 mLs by nebulization every 4 (four) hours as needed., Disp: 360 mL, Rfl: 2   lamoTRIgine (LAMICTAL) 200 MG tablet, Take 200 mg by mouth daily., Disp: , Rfl:    levalbuterol (XOPENEX HFA) 45 MCG/ACT inhaler, Inhale 2 puffs into the lungs 4 (four) times daily., Disp: 1 each, Rfl: 1   levocetirizine (XYZAL) 5 MG tablet, Take by mouth., Disp: , Rfl:    montelukast (SINGULAIR) 10 MG tablet, Take 1 tablet (10 mg total) by mouth every evening., Disp: 90 tablet, Rfl: 1   norethindrone (AYGESTIN) 5 MG tablet, Take 1 tablet (5 mg total) by mouth daily., Disp: 28 tablet, Rfl: 0   ondansetron (ZOFRAN-ODT) 8 MG disintegrating tablet, Take 1 tablet (8 mg total) by mouth every 8 (eight) hours as needed for nausea or vomiting., Disp: 20 tablet, Rfl: 6   propranolol ER (INDERAL LA) 160 MG SR capsule, Take 160 mg by mouth daily., Disp: , Rfl:    Rimegepant Sulfate (NURTEC) 75 MG TBDP, Take 1 tablet (75 mg total) by mouth as needed (take 1 tablet at onset of headache, max is 1 tablet in 24 hours)., Disp: 8 tablet, Rfl: 11   Semaglutide-Weight Management (WEGOVY) 2.4 MG/0.75ML SOAJ, Inject 2.4 mg into the skin once a week., Disp: 3 mL, Rfl: 3   sertraline (ZOLOFT) 100 MG tablet, Take 100 mg by mouth daily., Disp: , Rfl:    Ubrogepant (UBRELVY) 100 MG TABS, Take 100 mg by mouth as needed. May repeat a  dose in 2 hours if needed. Max dose 2 pills in 24 hours, Disp: 16 tablet, Rfl: 6   valACYclovir (VALTREX) 500 MG tablet, Take 1 tablet (500 mg total) by mouth daily., Disp: 90 tablet, Rfl: 1  Allergies  Allergen Reactions   Apple Itching and Swelling    ONLY certain "FRESH FRUITS" cause throat itches & in spring, throat feels like it will swell.  (apples, peaches, pears, plums, mango)   Prednisone Other (See Comments) and Palpitations    Severe mood swings and tachycardia   Amoxicillin-Pot Clavulanate Other (See Comments)    Mood swings Mood swings Mood swings   Tape Itching    I personally reviewed active problem list, medication list, allergies,  family history, social history, health maintenance with the patient/caregiver today.   ROS  Ten systems reviewed and is negative except as mentioned in HPI   Objective  Vitals:   12/10/22 0847  BP: 128/76  Pulse: 90  Resp: 18  Temp: 98.3 F (36.8 C)  TempSrc: Oral  SpO2: 97%  Weight: (!) 303 lb 11.2 oz (137.8 kg)  Height: '5\' 5"'$  (1.651 m)    Body mass index is 50.54 kg/m.  Physical Exam  Constitutional: Patient appears well-developed and well-nourished. Obese  No distress.  HEENT: head atraumatic, normocephalic, neck supple Cardiovascular: Normal rate, regular rhythm and normal heart sounds.  No murmur heard. No BLE edema. Pulmonary/Chest: Effort normal and breath sounds normal. No respiratory distress. Abdominal: Soft.  There is no tenderness. Neuro: chronic decrease in sensation of left upper extremity, normal grip, normal gait, photophobia, normal sensation of lower extremities, normal cranial nerves exam  Psychiatric: Patient has a normal mood and affect. behavior is normal. Judgment and thought content normal.    PHQ2/9:    12/10/2022    8:53 AM 12/09/2022    8:40 AM 11/26/2022    7:43 AM 10/25/2022    8:55 AM 07/29/2022    1:33 PM  Depression screen PHQ 2/9  Decreased Interest 0 0 0 0 1  Down, Depressed, Hopeless  0 0 0 0 1  PHQ - 2 Score 0 0 0 0 2  Altered sleeping 1 0 3 0 3  Tired, decreased energy 1 0 3 0 3  Change in appetite 0 0 0 0 2  Feeling bad or failure about yourself  0 0 0 0 0  Trouble concentrating 0 0 0 0 1  Moving slowly or fidgety/restless 0 0 0 0 0  Suicidal thoughts 0 0 0 0 0  PHQ-9 Score 2 0 6 0 11  Difficult doing work/chores Not difficult at all Not difficult at all  Not difficult at all Somewhat difficult    phq 9 is negative   Fall Risk:    12/10/2022    8:53 AM 12/09/2022    8:38 AM 11/26/2022    7:42 AM 10/25/2022    8:55 AM 07/29/2022    1:23 PM  Fall Risk   Falls in the past year? 0 0 0 0 0  Number falls in past yr:  0 0 0   Injury with Fall?  0 0 0   Risk for fall due to : No Fall Risks  No Fall Risks  No Fall Risks  Follow up Falls prevention discussed;Education provided  Falls prevention discussed  Falls prevention discussed      Functional Status Survey: Is the patient deaf or have difficulty hearing?: No Does the patient have difficulty seeing, even when wearing glasses/contacts?: No Does the patient have difficulty concentrating, remembering, or making decisions?: No Does the patient have difficulty walking or climbing stairs?: No Does the patient have difficulty dressing or bathing?: No Does the patient have difficulty doing errands alone such as visiting a doctor's office or shopping?: No    Assessment & Plan  1. Intractable migraine without aura and with status migrainosus  She states typical migraine episode, discussed possible side effects of medication , no neuro deficit She will instal a carbon monoxide kit in her house since  episodes seems to be triggered by staying at home   - dexamethasone (DECADRON) injection 10 mg - ketorolac (TORADOL) injection 60 mg - promethazine (PHENERGAN) injection 25 mg

## 2022-12-10 NOTE — Addendum Note (Signed)
Addended by: Suzzanne Cloud on: 12/10/2022 07:53 AM   Modules accepted: Orders

## 2022-12-11 ENCOUNTER — Ambulatory Visit: Payer: PRIVATE HEALTH INSURANCE | Admitting: Physician Assistant

## 2022-12-11 ENCOUNTER — Encounter: Payer: Self-pay | Admitting: Physician Assistant

## 2022-12-11 ENCOUNTER — Telehealth: Payer: Self-pay

## 2022-12-11 ENCOUNTER — Other Ambulatory Visit (HOSPITAL_COMMUNITY): Payer: Self-pay

## 2022-12-11 VITALS — BP 124/74 | HR 91 | Temp 98.7°F | Resp 16 | Ht 65.0 in | Wt 302.3 lb

## 2022-12-11 DIAGNOSIS — R253 Fasciculation: Secondary | ICD-10-CM | POA: Diagnosis not present

## 2022-12-11 DIAGNOSIS — R292 Abnormal reflex: Secondary | ICD-10-CM | POA: Diagnosis not present

## 2022-12-11 DIAGNOSIS — G43111 Migraine with aura, intractable, with status migrainosus: Secondary | ICD-10-CM | POA: Diagnosis not present

## 2022-12-11 NOTE — Telephone Encounter (Signed)
Pharmacy Patient Advocate Encounter   Received notification from [facility] Neurology that prior authorization for Nurtec '75MG'$  dispersible tablets is required/requested.    PA submitted on 12/11/2022 to (ins) [place] via [place] Status is pending   Pharmacy Patient Advocate Encounter   Received notification from [facility] Neurology that prior authorization for Emgality '120MG'$ /ML auto-injectors (migraine) is required/requested.    PA submitted on 12/11/2022 to (ins) [facility] via CoverMyMeds Key [id number] Status is pending

## 2022-12-11 NOTE — Progress Notes (Signed)
Acute Office Visit   Patient: Tracey Perry   DOB: [birth year]   32 y.o. Female  MRN: [id number] Visit Date: 12/11/2022  Today's healthcare provider: Dani Gobble Jenesa Foresta, PA-C  Introduced myself to the patient as a Journalist, newspaper and provided education on APPs in clinical practice.    Chief Complaint  Patient presents with   Tremors    On left arm/hand noticed today,also noticed while speaking she may not say a word or two. Unable to complete sentence   Subjective    HPI HPI     Tremors    Additional comments: On left arm/hand noticed today,also noticed while speaking she may not say a word or two. Unable to complete sentence      Last edited by Salomon Fick, [place] on 12/11/2022 11:32 AM.       Intractable Migraine Onset: sudden  Duration: 5 days  She states this is her 3rd multi-day migraine since the beginning of the year and this is not normal for her migraines  Pain level and character: pain has decreased from 8/10 to 5/10 today  Location of pain: frontal pain predominantly over left eye- she states it is her typical migraine pain   Associated symptoms: reports muscle spasms and tremors in left arm and hand (reports this is intermittent and seemingly without triggers)  Thinks the muscle spasms started last night  She reports throwing a pen across the room this AM  Reports new brain fog  She reports some word-finding difficulty this morning at work   She reports some lingering light and sound sensitivity but thinks this is improving   Interventions: She has been using her Ubrelvy, caffeine, Tylenol and Ibuprofen  Use of abortives: She has been using Ubrelvy 2 x on Sat and once Sun   She has tried to schedule an apt with her Neurologist but they are booked for now until July   Alleviating: She has not noticed anything that has helped with tremors or spasms  Aggravating: Nothing she has noticed    Medications: Outpatient Medications Prior to Visit   Medication Sig   amitriptyline (ELAVIL) 25 MG tablet Take 25 mg by mouth at bedtime.   budesonide-formoterol (SYMBICORT) 160-4.5 MCG/ACT inhaler Inhale 2 puffs into the lungs 2 (two) times daily.   clonazePAM (KLONOPIN) 0.5 MG tablet Take 1 tablet (0.5 mg total) by mouth 2 (two) times daily.   famotidine (PEPCID) 10 MG tablet Take by mouth.   fluticasone (FLONASE) 50 MCG/ACT nasal spray Place into both nostrils daily.   Galcanezumab-gnlm (EMGALITY) 120 MG/ML SOAJ Inject 120 mg into the skin every 30 (thirty) days.   indomethacin (INDOCIN) 25 MG capsule Take 1 tablet PO Q8H x 2 weeks, the 2 PO Q8H x 2 weeks, then 3 PO Q8H. DO NOT CRUSH. TAKE WITH FOOD.   ipratropium-albuterol (DUONEB) 0.5-2.5 (3) MG/3ML SOLN Take 3 mLs by nebulization every 4 (four) hours as needed.   lamoTRIgine (LAMICTAL) 200 MG tablet Take 200 mg by mouth daily.   levalbuterol (XOPENEX HFA) 45 MCG/ACT inhaler Inhale 2 puffs into the lungs 4 (four) times daily.   levocetirizine (XYZAL) 5 MG tablet Take by mouth.   montelukast (SINGULAIR) 10 MG tablet Take 1 tablet (10 mg total) by mouth every evening.   norethindrone (AYGESTIN) 5 MG tablet Take 1 tablet (5 mg total) by mouth daily.   ondansetron (ZOFRAN-ODT) 8 MG disintegrating tablet Take 1 tablet (8 mg total) by mouth every 8 (  eight) hours as needed for nausea or vomiting.   propranolol ER (INDERAL LA) 160 MG SR capsule Take 160 mg by mouth daily.   Rimegepant Sulfate (NURTEC) 75 MG TBDP Take 1 tablet (75 mg total) by mouth as needed (take 1 tablet at onset of headache, max is 1 tablet in 24 hours).   Semaglutide-Weight Management (WEGOVY) 2.4 MG/0.75ML SOAJ Inject 2.4 mg into the skin once a week.   sertraline (ZOLOFT) 100 MG tablet Take 100 mg by mouth daily.   Ubrogepant (UBRELVY) 100 MG TABS Take 100 mg by mouth as needed. May repeat a dose in 2 hours if needed. Max dose 2 pills in 24 hours   valACYclovir (VALTREX) 500 MG tablet Take 1 tablet (500 mg total) by mouth  daily.   No facility-administered medications prior to visit.    Review of Systems  Neurological:  Positive for dizziness, tremors, speech difficulty and headaches. Negative for seizures, weakness and numbness.  Psychiatric/Behavioral:  Positive for decreased concentration (Reports this is chronic from other medications).        Objective    BP 124/74   Pulse 91   Temp 98.7 F (37.1 C) (Oral)   Resp 16   Ht '5\' 5"'$  (1.651 m)   Wt (!) 302 lb 4.8 oz (137.1 kg)   SpO2 98%   BMI 50.31 kg/m    Physical Exam Vitals reviewed.  Constitutional:      General: She is awake.     Appearance: Normal appearance. She is well-developed and well-groomed.  HENT:     Head: Normocephalic and atraumatic.  Eyes:     General: Lids are normal. Gaze aligned appropriately.     Extraocular Movements: Extraocular movements intact.     Conjunctiva/sclera: Conjunctivae normal.     Pupils: Pupils are equal, round, and reactive to light.  Cardiovascular:     Rate and Rhythm: Normal rate and regular rhythm.     Pulses: Normal pulses.     Heart sounds: Normal heart sounds.  Pulmonary:     Effort: Pulmonary effort is normal.     Breath sounds: Normal breath sounds. No decreased air movement. No decreased breath sounds, wheezing, rhonchi or rales.  Neurological:     General: No focal deficit present.     Mental Status: She is alert and oriented to person, place, and time. Mental status is at baseline.     GCS: GCS eye subscore is 4. GCS verbal subscore is 5. GCS motor subscore is 6.     Cranial Nerves: Cranial nerves 2-12 are intact. No cranial nerve deficit, dysarthria or facial asymmetry.     Motor: Motor function is intact. No weakness, abnormal muscle tone or pronator drift.     Coordination: Coordination is intact.     Gait: Gait is intact.     Deep Tendon Reflexes:     Reflex Scores:      Tricep reflexes are 3+ on the right side and 3+ on the left side.      Brachioradialis reflexes are 3+ on  the right side and 3+ on the left side.    Comments: Intermittent jerking of left hand noted on exam and could be elicited with brachioradialis DTR testing   Psychiatric:        Attention and Perception: Attention and perception normal.        Mood and Affect: Mood and affect normal.        Speech: Speech normal.  Behavior: Behavior normal. Behavior is cooperative.       Results for orders placed or performed in visit on 12/11/22  COMPLETE METABOLIC PANEL WITH GFR  Result Value Ref Range   Glucose, Bld 92 65 - 99 mg/dL   BUN 18 7 - 25 mg/dL   Creat 0.78 0.50 - 0.97 mg/dL   eGFR 103 > OR = 60 mL/min/1.86m   BUN/Creatinine Ratio SEE NOTE: 6 - 22 (calc)   Sodium 142 135 - 146 mmol/L   Potassium 4.5 3.5 - 5.3 mmol/L   Chloride 108 98 - 110 mmol/L   CO2 24 20 - 32 mmol/L   Calcium 9.5 8.6 - 10.2 mg/dL   Total Protein 7.3 6.1 - 8.1 g/dL   Albumin 4.6 3.6 - 5.1 g/dL   Globulin 2.7 1.9 - 3.7 g/dL (calc)   AG Ratio 1.7 1.0 - 2.5 (calc)   Total Bilirubin 0.3 0.2 - 1.2 mg/dL   Alkaline phosphatase (APISO) 86 31 - 125 U/L   AST 13 10 - 30 U/L   ALT 11 6 - 29 U/L  CBC w/Diff/Platelet  Result Value Ref Range   WBC 14.2 (H) 3.8 - 10.8 Thousand/uL   RBC 4.39 3.80 - 5.10 Million/uL   Hemoglobin 13.7 11.7 - 15.5 g/dL   HCT 41.5 35.0 - 45.0 %   MCV 94.5 80.0 - 100.0 fL   MCH 31.2 27.0 - 33.0 pg   MCHC 33.0 32.0 - 36.0 g/dL   RDW 12.6 11.0 - 15.0 %   Platelets 300 140 - 400 Thousand/uL   MPV 10.5 7.5 - 12.5 fL   Neutro Abs 11,559 (H) 1,500 - 7,800 cells/uL   Lymphs Abs 1,803 850 - 3,900 cells/uL   Absolute Monocytes 809 200 - 950 cells/uL   Eosinophils Absolute 0 (L) 15 - 500 cells/uL   Basophils Absolute 28 0 - 200 cells/uL   Neutrophils Relative % 81.4 %   Total Lymphocyte 12.7 %   Monocytes Relative 5.7 %   Eosinophils Relative 0.0 %   Basophils Relative 0.2 %  Magnesium  Result Value Ref Range   Magnesium 2.2 1.5 - 2.5 mg/dL  B12  Result Value Ref Range   Vitamin  B-12 421 200 - 1,100 pg/mL    Assessment & Plan      No follow-ups on file.      Problem List Items Addressed This Visit       Cardiovascular and Mediastinum   Intractable migraine with aura with status migrainosus - Primary    Acute exacerbation and progression of chronic illness She reports this is her third multi-day migraine since the new year started and this is not normal for her Her URoselyn Meieris not providing abortive relief for her symptoms  She has reached out to her Neurology provider and they are working on getting her Emgality and Nurtec for current symptoms She reports new onset of brain fog, jerking and shaking in her hands, some word-finding difficulty, and persistent headache along her forehead  She has been seen in PCP office the past 2 days for intractable migraine and given Toradol injections along with Dexamethasone, and Phenergan yesterday.  She reports some improvement in pain today but is concerned about new symptoms as described in HPI Neuro exam is overall normal today with the exception of hyperreflexia bilaterally in wrist and triceps Will check magnesium, CMP, CBC, and B12  Recommend she continue to reach out to Neurology for updates on new medications Reviewed ED and return  precautions and advise ED if symptoms continue or worsen before she can be evaluated by Neuro.  Follow up as needed for progressing or persistent symptoms       Relevant Orders   COMPLETE METABOLIC PANEL WITH GFR (Completed)   CBC w/Diff/Platelet (Completed)   Other Visit Diagnoses     Hyperreflexia       Relevant Orders   COMPLETE METABOLIC PANEL WITH GFR (Completed)   CBC w/Diff/Platelet (Completed)   Magnesium (Completed)   B12 (Completed)   Jerking       Relevant Orders   Magnesium (Completed)   B12 (Completed)        No follow-ups on file.   I, Venson Ferencz E Rhyanna Sorce, PA-C, have reviewed all documentation for this visit. The documentation on 12/13/22 for the exam,  diagnosis, procedures, and orders are all accurate and complete.   Talitha Givens, MHS, PA-C [facility]

## 2022-12-11 NOTE — Telephone Encounter (Signed)
Please check to see if Emgality and Nurtec require prior authorization.

## 2022-12-12 ENCOUNTER — Encounter: Payer: Self-pay | Admitting: Physician Assistant

## 2022-12-12 LAB — CBC WITH DIFFERENTIAL/PLATELET
Absolute Monocytes: 809 cells/uL (ref 200–950)
Basophils Absolute: 28 cells/uL (ref 0–200)
Basophils Relative: 0.2 %
Eosinophils Absolute: 0 cells/uL — ABNORMAL LOW (ref 15–500)
Eosinophils Relative: 0 %
HCT: 41.5 % (ref 35.0–45.0)
Hemoglobin: 13.7 g/dL (ref 11.7–15.5)
Lymphs Abs: 1803 cells/uL (ref 850–3900)
MCH: 31.2 pg (ref 27.0–33.0)
MCHC: 33 g/dL (ref 32.0–36.0)
MCV: 94.5 fL (ref 80.0–100.0)
MPV: 10.5 fL (ref 7.5–12.5)
Monocytes Relative: 5.7 %
Neutro Abs: 11559 cells/uL — ABNORMAL HIGH (ref 1500–7800)
Neutrophils Relative %: 81.4 %
Platelets: 300 10*3/uL (ref 140–400)
RBC: 4.39 10*6/uL (ref 3.80–5.10)
RDW: 12.6 % (ref 11.0–15.0)
Total Lymphocyte: 12.7 %
WBC: 14.2 10*3/uL — ABNORMAL HIGH (ref 3.8–10.8)

## 2022-12-12 LAB — COMPLETE METABOLIC PANEL WITH GFR
AG Ratio: 1.7 (calc) (ref 1.0–2.5)
ALT: 11 U/L (ref 6–29)
AST: 13 U/L (ref 10–30)
Albumin: 4.6 g/dL (ref 3.6–5.1)
Alkaline phosphatase (APISO): 86 U/L (ref 31–125)
BUN: 18 mg/dL (ref 7–25)
CO2: 24 mmol/L (ref 20–32)
Calcium: 9.5 mg/dL (ref 8.6–10.2)
Chloride: 108 mmol/L (ref 98–110)
Creat: 0.78 mg/dL (ref 0.50–0.97)
Globulin: 2.7 g/dL (calc) (ref 1.9–3.7)
Glucose, Bld: 92 mg/dL (ref 65–99)
Potassium: 4.5 mmol/L (ref 3.5–5.3)
Sodium: 142 mmol/L (ref 135–146)
Total Bilirubin: 0.3 mg/dL (ref 0.2–1.2)
Total Protein: 7.3 g/dL (ref 6.1–8.1)
eGFR: 103 mL/min/{1.73_m2} (ref >=60)

## 2022-12-12 LAB — VITAMIN B12: Vitamin B-12: 421 pg/mL (ref 200–1100)

## 2022-12-12 LAB — MAGNESIUM: Magnesium: 2.2 mg/dL (ref 1.5–2.5)

## 2022-12-12 NOTE — Telephone Encounter (Signed)
Both of these requests are still pending we will update once we have decision.

## 2022-12-12 NOTE — Telephone Encounter (Signed)
Pt states she was informed earlier today that the PA was denied on Nurtec, Emgality still pending.  Pt asking what is going to be done now.

## 2022-12-13 DIAGNOSIS — G43111 Migraine with aura, intractable, with status migrainosus: Secondary | ICD-10-CM | POA: Insufficient documentation

## 2022-12-13 NOTE — Progress Notes (Signed)
Your CBC does show an elevated white count which is likely due the high neutrophils- this is a common side effect from steroids which you were given during your apt with Dr. Ancil Boozer. Your electrolytes, liver and kidney function are all in normal limits  Your magnesium and B12 were in normal limits as well.  Overall I think your symptoms were likely from your migraine. You can look on the Nurtec website and see if there is a coupon to get a short supply while you are waiting for the PA to go through. You can also ask your Neurology office if they have samples that they can provide while you are waiting.  I hope you are feeling better.

## 2022-12-13 NOTE — Telephone Encounter (Signed)
Pharmacy Patient Advocate Encounter  Received notification from [place] that the request for prior authorization for Emgality has been denied due to See below..      Per chart review it states PT only has one H/A per month, and pt has not tried Aimovig or Ajovy and will not allow to take two CGRP.  Please be advised we currently do not have a Pharmacist to review denials, therefore you will need to process appeals accordingly as needed. Thanks for your support at this time.   You may call see below or fax see below, to appeal.

## 2022-12-13 NOTE — Assessment & Plan Note (Addendum)
Acute exacerbation and progression of chronic illness She reports this is her third multi-day migraine since the new year started and this is not normal for her Her Roselyn Meier is not providing abortive relief for her symptoms  She has reached out to her Neurology provider and they are working on getting her Emgality and Nurtec for current symptoms She reports new onset of brain fog, jerking and shaking in her hands, some word-finding difficulty, and persistent headache along her forehead  She has been seen in PCP office the past 2 days for intractable migraine and given Toradol injections along with Dexamethasone, and Phenergan yesterday.  She reports some improvement in pain today but is concerned about new symptoms as described in HPI Neuro exam is overall normal today with the exception of hyperreflexia bilaterally in wrist and triceps Will check magnesium, CMP, CBC, and B12  Recommend she continue to reach out to Neurology for updates on new medications Reviewed ED and return precautions and advise ED if symptoms continue or worsen before she can be evaluated by Neuro.  Follow up as needed for progressing or persistent symptoms

## 2022-12-16 MED ORDER — AJOVY 225 MG/1.5ML ~~LOC~~ SOAJ: 225.0000 mg | SUBCUTANEOUS | 11 refills | Status: DC

## 2022-12-16 NOTE — Addendum Note (Signed)
Addended by: Suzzanne Cloud on: 12/16/2022 01:04 PM   Modules accepted: Orders

## 2022-12-16 NOTE — Addendum Note (Signed)
Addended by: Suzzanne Cloud on: 12/16/2022 01:06 PM   Modules accepted: Orders

## 2022-12-26 ENCOUNTER — Encounter: Payer: Self-pay | Admitting: Neurology

## 2022-12-30 ENCOUNTER — Telehealth: Payer: Self-pay | Admitting: Pharmacy Technician

## 2022-12-30 NOTE — Telephone Encounter (Signed)
Patient Advocate Encounter   Received notification that prior authorization for AJOVY (fremanezumab-vfrm) injection 225MG/1.5ML auto-injectors is required.   PA submitted on 12/30/2022 Key [id number] Status is pending       Lyndel Safe, [place] Patient Advocate Specialist [place] Patient Advocate Team Direct Number: [phone]  Fax: [phone]

## 2023-01-07 NOTE — Telephone Encounter (Signed)
Patient Advocate Encounter  Prior Authorization for AJOVY (fremanezumab-vfrm) injection 225MG/1.5ML auto-injectors has been approved.    PA# [id number] Effective dates: 12/30/2022 through 06/30/2023      Lyndel Safe, [place] Patient Advocate Specialist [place] Patient Advocate Team Direct Number: [phone]  Fax: [phone]

## 2023-01-21 ENCOUNTER — Other Ambulatory Visit: Payer: Self-pay

## 2023-01-21 ENCOUNTER — Encounter: Payer: Self-pay | Admitting: Family Medicine

## 2023-01-21 DIAGNOSIS — J4530 Mild persistent asthma, uncomplicated: Secondary | ICD-10-CM

## 2023-01-21 MED ORDER — VALACYCLOVIR HCL 500 MG PO TABS: 500.0000 mg | ORAL_TABLET | Freq: Every day | ORAL | 1 refills | Status: DC

## 2023-01-30 ENCOUNTER — Telehealth: Payer: Self-pay | Admitting: *Deleted

## 2023-01-30 NOTE — Telephone Encounter (Signed)
Pt requested appt with another NP. Katie, RN called pt to schedule appt w/ Dr. Billey Gosling. Offered Oct 2024 appt. Pt requested sooner appt. I called pt and scheduled appt for 03/04/23 at 10am (MD approved to work in). Pt accepted. July/Oct appt cx.

## 2023-02-18 ENCOUNTER — Telehealth: Payer: Self-pay | Admitting: Psychiatry

## 2023-02-18 NOTE — Telephone Encounter (Signed)
I spoke with Dr. Billey Gosling and she said that we do not typically allow pt's to see another MD and stated that pt could be scheduled for first available for when she comes back from leave or be referred out. I called pt to relay this message and pt stated she had already made an appointment with [facility]. Appointment with Dr. Billey Gosling for 02/24/23 has been cancelled due to [place] being out.

## 2023-02-18 NOTE — Telephone Encounter (Signed)
Pt is upset that there is a wait to see Dr. Billey Gosling and would like to see another MD at our office

## 2023-02-24 ENCOUNTER — Ambulatory Visit: Payer: PRIVATE HEALTH INSURANCE | Admitting: Psychiatry

## 2023-03-04 ENCOUNTER — Ambulatory Visit: Payer: PRIVATE HEALTH INSURANCE | Admitting: Psychiatry

## 2023-03-21 NOTE — Patient Instructions (Signed)

## 2023-03-21 NOTE — Progress Notes (Unsigned)
Name: Tracey Perry   MRN: [id number]    DOB: July 18, 1991   Date:03/24/2023       Progress Note  Subjective  Chief Complaint  Annual Exam  HPI  Patient presents for annual CPE.  Diet: she is taking Wegovy she is making better food choices  Exercise: discussed regular physical activity   Last Eye Exam: up to date  Last Dental Exam: up to date   Flowsheet Row Office Visit from 03/24/2023 in [facility]  AUDIT-C Score 1      Depression: Phq 9 is  negative    03/24/2023    7:43 AM 12/11/2022   11:27 AM 12/10/2022    8:53 AM 12/09/2022    8:40 AM 11/26/2022    7:43 AM  Depression screen PHQ 2/9  Decreased Interest 1 0 0 0 0  Down, Depressed, Hopeless 0 0 0 0 0  PHQ - 2 Score 1 0 0 0 0  Altered sleeping 3 1 1  0 3  Tired, decreased energy 3 1 1  0 3  Change in appetite 0 0 0 0 0  Feeling bad or failure about yourself  0 0 0 0 0  Trouble concentrating 0 0 0 0 0  Moving slowly or fidgety/restless 0 0 0 0 0  Suicidal thoughts 0 0 0 0 0  PHQ-9 Score 7 2 2  0 6  Difficult doing work/chores  Not difficult at all Not difficult at all Not difficult at all    Hypertension: BP Readings from Last 3 Encounters:  03/24/23 122/68  12/11/22 124/74  12/10/22 128/76   Obesity: Wt Readings from Last 3 Encounters:  03/24/23 (!) 302 lb (137 kg)  12/11/22 (!) 302 lb 4.8 oz (137.1 kg)  12/10/22 (!) 303 lb 11.2 oz (137.8 kg)   BMI Readings from Last 3 Encounters:  03/24/23 50.26 kg/m  12/11/22 50.31 kg/m  12/10/22 50.54 kg/m     Vaccines:   HPV: up to date Tdap: up to date Shingrix: N/A Pneumonia: up to date  Flu: up to date COVID-19: up to date   Hep C Screening: 03/14/21 STD testing and prevention (HIV/chl/gon/syphilis): 11/22/12 Intimate partner violence: negative screen  Sexual History : not sexually active in a long time  Menstrual History/LMP/Abnormal Bleeding: on continuous ocp due to heavy periods and triggered migraine headaches and also mood  changes  Discussed importance of follow up if any post-menopausal bleeding: not applicable  Incontinence Symptoms: negative for symptoms   Breast cancer:  - Last Mammogram: N/A - BRCA gene screening: N/A  Osteoporosis Prevention : Discussed high calcium and vitamin D supplementation, weight bearing exercises Bone density: N/A   Cervical cancer screening: N/A  Skin cancer: Discussed monitoring for atypical lesions - she saw Dr. Neale Burly and was advised to go back next year  Colorectal cancer: N/A    ECG: 09/12/21  Advanced Care Planning: A voluntary discussion about advance care planning including the explanation and discussion of advance directives.  Discussed health care proxy and Living will, and the patient was able to identify a health care proxy as mother .  Patient does not have a living will and power of attorney of health care   Lipids: Lab Results  Component Value Date   CHOL 154 11/26/2022   CHOL 186 03/14/2021   CHOL 159 10/01/2018   Lab Results  Component Value Date   HDL 43 (L) 11/26/2022   HDL 51 03/14/2021   HDL 49 10/01/2018   Lab Results  Component Value Date   LDLCALC 95 11/26/2022   LDLCALC 118 (H) 03/14/2021   LDLCALC 100 10/01/2018   Lab Results  Component Value Date   TRIG 71 11/26/2022   TRIG 71 03/14/2021   TRIG 50 10/01/2018   Lab Results  Component Value Date   CHOLHDL 3.6 11/26/2022   CHOLHDL 3.6 03/14/2021   No results found for: "LDLDIRECT"  Glucose: Glucose  Date Value Ref Range Status  10/26/2021 82 70 - 99 mg/dL Final  16/08/9603 540 (H) 70 - 99 mg/dL Final  98/09/9146 86 65 - 99 mg/dL Final   Glucose, Bld  Date Value Ref Range Status  12/11/2022 92 65 - 99 mg/dL Final    Comment:    .            Fasting reference interval .   11/26/2022 95 65 - 99 mg/dL Final    Comment:    .            Fasting reference interval .   03/14/2021 86 65 - 99 mg/dL Final    Comment:    .            Fasting reference interval .      Patient Active Problem List   Diagnosis Date Noted   Intractable migraine with aura with status migrainosus 12/13/2022   B12 deficiency 11/26/2022   Moderate persistent allergic asthma without complication 06/07/2022   Morbid obesity with BMI of 50.0-59.9, adult (HCC) 05/08/2022   Episodic tension-type headache, not intractable 04/25/2021   Idiopathic stabbing headache 04/25/2021   Anxiety 12/29/2020   Cervical spondylosis with myelopathy and radiculopathy 12/11/2020   Cervical stenosis of spinal canal 12/11/2020   Chronic pain 12/06/2020   Cervical spinal stenosis 05/05/2020   Bulging of cervical intervertebral disc 05/05/2020   Cervical radiculopathy 05/05/2020   Bipolar 2 disorder (HCC) 12/15/2018   Fever blister 07/29/2016   Menorrhagia with irregular cycle 06/20/2016   OSA on CPAP 05/11/2016   Hypersomnia 12/25/2015   Cluster B personality disorder (HCC) 10/27/2015   H/O urinary tract infection 06/21/2015   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   Depression, major, recurrent, moderate (HCC) 06/21/2015   Gastroparesis 06/21/2015   Hx of cold sores 06/21/2015   Generalized anxiety disorder 05/31/2015   History of pyelonephritis 05/30/2015   Dysmenorrhea 05/30/2015   History of asthma 05/30/2015   Migraine without aura and responsive to treatment 05/30/2015   Allergic rhinitis, seasonal 05/30/2015   Vertigo 05/30/2015   IBS (irritable bowel syndrome) 09/20/2013   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   H/O cervical spine surgery 12/29/2012   Vitamin D deficiency 11/23/2009   History of mononucleosis 04/03/2009    Past Surgical History:  Procedure Laterality Date   ADENOIDECTOMY     anterior cervical decompression  Left 12/12/2020   revision with removal of hardware and decompression  - done at [facility]   APPENDECTOMY     CHOLECYSTECTOMY     COLONOSCOPY WITH PROPOFOL N/A 04/30/2021   Procedure: COLONOSCOPY WITH PROPOFOL;  Surgeon: Toney Reil, MD;  Location[facility]  ENDOSCOPY;  Service: Gastroenterology;  Laterality: N/A;   TONSILLECTOMY     TYMPANOSTOMY TUBE PLACEMENT      Family History  Problem Relation Age of Onset   Depression Mother    Hypertension Mother        controlled   Allergic rhinitis Mother    Anxiety disorder Mother    Diabetes Father        controlled   Hypertension  Father    Allergic rhinitis Father    Depression Father    Depression Brother    Anxiety disorder Brother     Social History   Socioeconomic History   Marital status: Single    Spouse name: Not on file   Number of children: 0   Years of education: Not on file   Highest education level: Master's degree (e.g., MA, MS, MEng, MEd, MSW, MBA)  Occupational History   Occupation: retail     Comment: 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   Social Determinants of Health   Financial Resource Strain: Low Risk  (03/24/2023)   Overall Financial Resource Strain (CARDIA)    Difficulty of Paying Living Expenses: Not hard at all  Food Insecurity: No Food Insecurity (03/24/2023)   Hunger Vital Sign    Worried About Running Out of Food in the Last Year: Never true    Ran Out of Food in the Last Year: Never true  Transportation Needs: No Transportation Needs (03/24/2023)   PRAPARE - Administrator, Civil Service (Medical): No    Lack of Transportation (Non-Medical): No  Physical Activity: Inactive (03/24/2023)   Exercise Vital Sign    Days of Exercise per Week: 0 days    Minutes of Exercise per Session: 0 min   Stress: No Stress Concern Present (03/24/2023)   [employer] of Occupational Health - Occupational Stress Questionnaire    Feeling of Stress : Not at all  Social Connections: Socially Isolated (03/24/2023)   Social Connection and Isolation Panel [NHANES]    Frequency of Communication with Friends and Family: More than three times a week    Frequency of Social Gatherings with Friends and Family: Twice a week    Attends Religious Services: Never    Database administrator or Organizations: No    Attends Banker Meetings: Never    Marital Status: Never married  Intimate Partner Violence: Not At Risk (03/24/2023)   Humiliation, Afraid, Rape, and Kick questionnaire    Fear of Current or Ex-Partner: No    Emotionally Abused: No    Physically Abused: No    Sexually Abused: No     Current Outpatient Medications:    amitriptyline (ELAVIL) 25 MG tablet, Take 25 mg by mouth at bedtime., Disp: , Rfl:    budesonide-formoterol (SYMBICORT) 160-4.5 MCG/ACT inhaler, Inhale 2 puffs into the lungs 2 (two) times daily., Disp: 3 each, Rfl: 1   clonazePAM (KLONOPIN) 0.5 MG tablet, Take 1 tablet (0.5 mg total) by mouth 2 (two) times daily., Disp: 60 tablet, Rfl: 4   famotidine (PEPCID) 10 MG tablet, Take by mouth., Disp: , Rfl:    fluticasone (FLONASE) 50 MCG/ACT nasal spray, Place into both nostrils daily., Disp: , Rfl:    Fremanezumab-vfrm (AJOVY) 225 MG/1.5ML SOAJ, Inject 225 mg into the skin every 30 (thirty) days., Disp: 1.68 mL, Rfl: 11   indomethacin (INDOCIN) 25 MG capsule, Take  1 tablet PO Q8H x 2 weeks, the 2 PO Q8H x 2 weeks, then 3 PO Q8H. DO NOT CRUSH. TAKE WITH FOOD., Disp: , Rfl:    ipratropium-albuterol (DUONEB) 0.5-2.5 (3) MG/3ML SOLN, Take 3 mLs by nebulization every 4 (four) hours as needed., Disp: 360 mL, Rfl: 2   lamoTRIgine (LAMICTAL) 200 MG tablet, Take 200 mg by mouth daily., Disp: , Rfl:    levalbuterol (XOPENEX HFA) 45 MCG/ACT inhaler, Inhale 2 puffs into the lungs 4  (four) times daily., Disp: 1 each, Rfl: 1   levocetirizine (XYZAL) 5 MG tablet, Take by mouth., Disp: , Rfl:    montelukast (SINGULAIR) 10 MG tablet, Take 1 tablet (10 mg total) by mouth every evening., Disp: 90 tablet, Rfl: 1   norethindrone (AYGESTIN) 5 MG tablet, Take 1 tablet (5 mg total) by mouth daily., Disp: 28 tablet, Rfl: 0   ondansetron (ZOFRAN-ODT) 8 MG disintegrating tablet, Take 1 tablet (8 mg total) by mouth every 8 (eight) hours as needed for nausea or vomiting., Disp: 20 tablet, Rfl: 6   propranolol ER (INDERAL LA) 160 MG SR capsule, Take 160 mg by mouth daily., Disp: , Rfl:    Rimegepant Sulfate (NURTEC) 75 MG TBDP, Take 1 tablet (75 mg total) by mouth as needed (take 1 tablet at onset of headache, max is 1 tablet in 24 hours)., Disp: 8 tablet, Rfl: 11   Semaglutide-Weight Management (WEGOVY) 2.4 MG/0.75ML SOAJ, Inject 2.4 mg into the skin once a week., Disp: 3 mL, Rfl: 3   sertraline (ZOLOFT) 100 MG tablet, Take 100 mg by mouth daily., Disp: , Rfl:    valACYclovir (VALTREX) 500 MG tablet, Take 1 tablet (500 mg total) by mouth daily. Tid during outbreaks, Disp: 100 tablet, Rfl: 1  Allergies  Allergen Reactions   Apple Itching and Swelling    ONLY certain "FRESH FRUITS" cause throat itches & in spring, throat feels like it will swell.  (apples, peaches, pears, plums, mango)   Prednisone Other (See Comments) and Palpitations    Severe mood swings and tachycardia   Amoxicillin-Pot Clavulanate Other (See Comments)    Mood swings Mood swings Mood swings   Tape Itching     ROS  Constitutional: Negative for fever or weight change.  Respiratory: Negative for cough and shortness of breath.   Cardiovascular: Negative for chest pain or palpitations.  Gastrointestinal: Negative for abdominal pain, no bowel changes.  Musculoskeletal: Negative for gait problem or joint swelling.  Skin: Negative for rash.  Neurological: Negative for dizziness or headache.  No other specific  complaints in a complete review of systems (except as listed in HPI above).   Objective  Vitals:   03/24/23 0744  BP: 122/68  Pulse: 94  Resp: 16  SpO2: 95%  Weight: (!) 302 lb (137 kg)  Height: 5\' 5"  (1.651 m)    Body mass index is 50.26 kg/m.  Physical Exam  Constitutional: Patient appears well-developed and well-nourished. Obese .No distress.  HENT: Head: Normocephalic and atraumatic. Ears: B TMs ok, no erythema or effusion; Nose: Nose normal. Mouth/Throat: Oropharynx is clear and moist. No oropharyngeal exudate.  Eyes: Conjunctivae and EOM are normal. Pupils are equal, round, and reactive to light. No scleral icterus.  Neck: Normal range of motion. Neck supple. No JVD present. No thyromegaly present.  Cardiovascular: Normal rate, regular rhythm and normal heart sounds.  No murmur heard. No BLE edema. Pulmonary/Chest: Effort normal and breath sounds normal. No respiratory distress. Abdominal: Soft. Bowel sounds are normal, no  distension. There is no tenderness. no masses Breast: no lumps or masses, no nipple discharge or rashes FEMALE GENITALIA:  Not done  RECTAL: not noe  Musculoskeletal: Normal range of motion, no joint effusions. No gross deformities Neurological: he is alert and oriented to person, place, and time. No cranial nerve deficit. Coordination, balance, strength, speech and gait are normal.  Skin: Skin is warm and dry. No rash noted. No erythema.  Psychiatric: Patient has a normal mood and affect. behavior is normal. Judgment and thought content normal.   Fall Risk:    03/24/2023    7:43 AM 12/11/2022   11:27 AM 12/10/2022    8:53 AM 12/09/2022    8:38 AM 11/26/2022    7:42 AM  Fall Risk   Falls in the past year? 0 0 0 0 0  Number falls in past yr: 0 0  0 0  Injury with Fall? 0 0  0 0  Risk for fall due to : No Fall Risks No Fall Risks No Fall Risks  No Fall Risks  Follow up Falls prevention discussed Falls prevention discussed;Education provided;Falls  evaluation completed Falls prevention discussed;Education provided  Falls prevention discussed     Functional Status Survey: Is the patient deaf or have difficulty hearing?: No Does the patient have difficulty seeing, even when wearing glasses/contacts?: No Does the patient have difficulty concentrating, remembering, or making decisions?: Yes Does the patient have difficulty walking or climbing stairs?: No Does the patient have difficulty dressing or bathing?: No Does the patient have difficulty doing errands alone such as visiting a doctor's office or shopping?: No   Assessment & Plan  1. Well adult exam  - Lipid panel - COMPLETE METABOLIC PANEL WITH GFR - CBC with Differential/Platelet - Vitamin B12 - VITAMIN D 25 Hydroxy (Vit-D Deficiency, Fractures) - Hemoglobin A1c  2. B12 deficiency  - Vitamin B12  3. Vitamin D deficiency  - VITAMIN D 25 Hydroxy (Vit-D Deficiency, Fractures)  4. Long-term use of high-risk medication  - COMPLETE METABOLIC PANEL WITH GFR - CBC with Differential/Platelet  5. Lipid screening  - Lipid panel  6. Diabetes mellitus screening  - Hemoglobin A1c    -USPSTF grade A and B recommendations reviewed with patient; age-appropriate recommendations, preventive care, screening tests, etc discussed and encouraged; healthy living encouraged; see AVS for patient education given to patient -Discussed importance of 150 minutes of physical activity weekly, eat two servings of fish weekly, eat one serving of tree nuts ( cashews, pistachios, pecans, almonds.Marland Kitchen) every other day, eat 6 servings of fruit/vegetables daily and drink plenty of water and avoid sweet beverages.   -Reviewed Health Maintenance: Yes.

## 2023-03-24 ENCOUNTER — Ambulatory Visit (INDEPENDENT_AMBULATORY_CARE_PROVIDER_SITE_OTHER): Payer: PRIVATE HEALTH INSURANCE | Admitting: Family Medicine

## 2023-03-24 ENCOUNTER — Encounter: Payer: Self-pay | Admitting: Family Medicine

## 2023-03-24 VITALS — BP 122/68 | HR 94 | Resp 16 | Ht 65.0 in | Wt 302.0 lb

## 2023-03-24 DIAGNOSIS — Z0000 Encounter for general adult medical examination without abnormal findings: Secondary | ICD-10-CM

## 2023-03-24 DIAGNOSIS — E559 Vitamin D deficiency, unspecified: Secondary | ICD-10-CM | POA: Diagnosis not present

## 2023-03-24 DIAGNOSIS — Z13220 Encounter for screening for lipoid disorders: Secondary | ICD-10-CM

## 2023-03-24 DIAGNOSIS — Z79899 Other long term (current) drug therapy: Secondary | ICD-10-CM

## 2023-03-24 DIAGNOSIS — Z131 Encounter for screening for diabetes mellitus: Secondary | ICD-10-CM

## 2023-03-24 DIAGNOSIS — E538 Deficiency of other specified B group vitamins: Secondary | ICD-10-CM | POA: Diagnosis not present

## 2023-03-24 LAB — CBC WITH DIFFERENTIAL/PLATELET
Absolute Monocytes: 631 cells/uL (ref 200–950)
Basophils Relative: 0.5 %
Lymphs Abs: 1756 cells/uL (ref 850–3900)
MCV: 95.5 fL (ref 80.0–100.0)
RDW: 12.2 % (ref 11.0–15.0)

## 2023-03-25 LAB — CBC WITH DIFFERENTIAL/PLATELET
Basophils Absolute: 49 cells/uL (ref 0–200)
Eosinophils Absolute: 223 cells/uL (ref 15–500)
Eosinophils Relative: 2.3 %
HCT: 38.6 % (ref 35.0–45.0)
Hemoglobin: 12.7 g/dL (ref 11.7–15.5)
MCH: 31.4 pg (ref 27.0–33.0)
MCHC: 32.9 g/dL (ref 32.0–36.0)
MPV: 10.1 fL (ref 7.5–12.5)
Monocytes Relative: 6.5 %
Neutro Abs: 7042 cells/uL (ref 1500–7800)
Neutrophils Relative %: 72.6 %
Platelets: 257 10*3/uL (ref 140–400)
RBC: 4.04 10*6/uL (ref 3.80–5.10)
Total Lymphocyte: 18.1 %
WBC: 9.7 10*3/uL (ref 3.8–10.8)

## 2023-03-25 LAB — LIPID PANEL
Cholesterol: 143 mg/dL (ref <200)
HDL: 42 mg/dL — ABNORMAL LOW (ref >=50)
LDL Cholesterol (Calc): 80 mg/dL (calc)
Non-HDL Cholesterol (Calc): 101 mg/dL (calc) (ref <130)
Total CHOL/HDL Ratio: 3.4 (calc) (ref <5.0)
Triglycerides: 117 mg/dL (ref <150)

## 2023-03-25 LAB — HEMOGLOBIN A1C
Hgb A1c MFr Bld: 5.4 % of total Hgb (ref <5.7)
Mean Plasma Glucose: 108 mg/dL
eAG (mmol/L): 6 mmol/L

## 2023-03-25 LAB — COMPLETE METABOLIC PANEL WITH GFR
AG Ratio: 1.6 (calc) (ref 1.0–2.5)
ALT: 19 U/L (ref 6–29)
AST: 16 U/L (ref 10–30)
Albumin: 3.9 g/dL (ref 3.6–5.1)
Alkaline phosphatase (APISO): 91 U/L (ref 31–125)
BUN: 21 mg/dL (ref 7–25)
CO2: 24 mmol/L (ref 20–32)
Calcium: 9 mg/dL (ref 8.6–10.2)
Chloride: 107 mmol/L (ref 98–110)
Creat: 0.94 mg/dL (ref 0.50–0.97)
Globulin: 2.5 g/dL (calc) (ref 1.9–3.7)
Glucose, Bld: 70 mg/dL (ref 65–99)
Potassium: 4.9 mmol/L (ref 3.5–5.3)
Sodium: 140 mmol/L (ref 135–146)
Total Bilirubin: 0.3 mg/dL (ref 0.2–1.2)
Total Protein: 6.4 g/dL (ref 6.1–8.1)
eGFR: 82 mL/min/{1.73_m2} (ref >=60)

## 2023-03-25 LAB — VITAMIN B12: Vitamin B-12: 368 pg/mL (ref 200–1100)

## 2023-03-25 LAB — VITAMIN D 25 HYDROXY (VIT D DEFICIENCY, FRACTURES): Vit D, 25-Hydroxy: 36 ng/mL (ref 30–100)

## 2023-03-31 NOTE — Progress Notes (Unsigned)
Name: Tracey Perry   MRN: [id number]    DOB: [birth year]   Date:04/01/2023       Progress Note  Subjective  Chief Complaint  Follow Up  HPI  Migraine headaches: She states migraine is stable, episodes a few times a month but when severe needs to go to work late or leave. She states she has photophobia and phonophobia and needs to be a in a quiet room. Sometimes unable to drive or function when migraine is present  Daytime somnolence even on CPAP, TSH was normal   Asthma Moderate  persistent : she continues to have increase in sob even though she is losing weight, no cough or wheezing, Currently on symbicort and singulair daily , saw pulmonologist and evaluation was negative - CT showed mild air trapping , she was referred to cardiologist   Obesity: she started on Wegovy back in April 2023 , start weight was 331 lbs, today is down to 302 lbs she has lost 10 % of her original weight., today weight is down to 296.4 lbs  . She is tolerating medication well, she states she dropped one pant size, down from size 26 to 22-24  clothes are fitting loose  She has side effects of constipation but has controlled it with a stool softener 3 days a week    OSA: she had initial study in 2017 and qualified for starting therapy however her insurance denied the treatment. She went back for sleep titration study that was done 02/10/2022 and came in today to discuss results however we don't have the report yet. She did not noticed a difference the day after the study. She feels tired constantly, never wakes up feeling rested.  We tried Provigil and took it for 2 weeks but cause her to feel very anxious and irritability , she tried [facility] for about 6 weeks but it caused irritability and did not help with daytime somnolence. She is no longer drinking caffeine to stay awake due to increase in anxiety We will refer her to sleep sub-specialist since she continues to spend most of her weekends in bed.   Herpes type 1:  doing well on prn valtrex  , episodes about 4 times per year with suppressive therapy otherwise episodes lasts a long time and is very frequent   Bipolar disorder: she is feeling much better  stable on current dose of medications. She still sees psychiatrist . Unchanged   Patient Active Problem List   Diagnosis Date Noted   Intractable migraine with aura with status migrainosus 12/13/2022   B12 deficiency 11/26/2022   Moderate persistent allergic asthma without complication 06/07/2022   Morbid obesity with BMI of 50.0-59.9, adult (HCC) 05/08/2022   Episodic tension-type headache, not intractable 04/25/2021   Idiopathic stabbing headache 04/25/2021   Anxiety 12/29/2020   Cervical spondylosis with myelopathy and radiculopathy 12/11/2020   Cervical stenosis of spinal canal 12/11/2020   Chronic pain 12/06/2020   Cervical spinal stenosis 05/05/2020   Bulging of cervical intervertebral disc 05/05/2020   Cervical radiculopathy 05/05/2020   Bipolar 2 disorder (HCC) 12/15/2018   Fever blister 07/29/2016   Menorrhagia with irregular cycle 06/20/2016   OSA on CPAP 05/11/2016   Hypersomnia 12/25/2015   Cluster B personality disorder (HCC) 10/27/2015   H/O urinary tract infection 06/21/2015   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   Depression, major, recurrent, moderate (HCC) 06/21/2015   Gastroparesis 06/21/2015   Hx of cold sores 06/21/2015   Generalized anxiety disorder 05/31/2015   History of  pyelonephritis 05/30/2015   Dysmenorrhea 05/30/2015   History of asthma 05/30/2015   Migraine without aura and responsive to treatment 05/30/2015   Allergic rhinitis, seasonal 05/30/2015   Vertigo 05/30/2015   IBS (irritable bowel syndrome) 09/20/2013   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   H/O cervical spine surgery 12/29/2012   Vitamin D deficiency 11/23/2009   History of mononucleosis 04/03/2009    Past Surgical History:  Procedure Laterality Date   ADENOIDECTOMY     anterior cervical  decompression  Left 12/12/2020   revision with removal of hardware and decompression  - done at [facility]   APPENDECTOMY     CHOLECYSTECTOMY     COLONOSCOPY WITH PROPOFOL N/A 04/30/2021   Procedure: COLONOSCOPY WITH PROPOFOL;  Surgeon: Toney Reil, MD;  Location: [facility] ENDOSCOPY;  Service: Gastroenterology;  Laterality: N/A;   TONSILLECTOMY     TYMPANOSTOMY TUBE PLACEMENT      Family History  Problem Relation Age of Onset   Depression Mother    Hypertension Mother        controlled   Allergic rhinitis Mother    Anxiety disorder Mother    Diabetes Father        controlled   Hypertension Father    Allergic rhinitis Father    Depression Father    Depression Brother    Anxiety disorder Brother     Social History   Tobacco Use   Smoking status: Never   Smokeless tobacco: Never  Substance Use Topics   Alcohol use: Not Currently    Alcohol/week: 0.0 standard drinks of alcohol     Current Outpatient Medications:    amitriptyline (ELAVIL) 25 MG tablet, Take 25 mg by mouth at bedtime., Disp: , Rfl:    budesonide-formoterol (SYMBICORT) 160-4.5 MCG/ACT inhaler, Inhale 2 puffs into the lungs 2 (two) times daily., Disp: 3 each, Rfl: 1   clonazePAM (KLONOPIN) 0.5 MG tablet, Take 1 tablet (0.5 mg total) by mouth 2 (two) times daily., Disp: 60 tablet, Rfl: 4   famotidine (PEPCID) 10 MG tablet, Take by mouth., Disp: , Rfl:    fluticasone (FLONASE) 50 MCG/ACT nasal spray, Place into both nostrils daily., Disp: , Rfl:    Fremanezumab-vfrm (AJOVY) 225 MG/1.5ML SOAJ, Inject 225 mg into the skin every 30 (thirty) days., Disp: 1.68 mL, Rfl: 11   indomethacin (INDOCIN) 25 MG capsule, Take 1 tablet PO Q8H x 2 weeks, the 2 PO Q8H x 2 weeks, then 3 PO Q8H. DO NOT CRUSH. TAKE WITH FOOD., Disp: , Rfl:    ipratropium-albuterol (DUONEB) 0.5-2.5 (3) MG/3ML SOLN, Take 3 mLs by nebulization every 4 (four) hours as needed., Disp: 360 mL, Rfl: 2   lamoTRIgine (LAMICTAL) 200 MG tablet, Take 200 mg by mouth  daily., Disp: , Rfl:    levalbuterol (XOPENEX HFA) 45 MCG/ACT inhaler, Inhale 2 puffs into the lungs 4 (four) times daily., Disp: 1 each, Rfl: 1   levocetirizine (XYZAL) 5 MG tablet, Take by mouth., Disp: , Rfl:    montelukast (SINGULAIR) 10 MG tablet, Take 1 tablet (10 mg total) by mouth every evening., Disp: 90 tablet, Rfl: 1   norethindrone (AYGESTIN) 5 MG tablet, Take 1 tablet (5 mg total) by mouth daily., Disp: 28 tablet, Rfl: 0   ondansetron (ZOFRAN-ODT) 8 MG disintegrating tablet, Take 1 tablet (8 mg total) by mouth every 8 (eight) hours as needed for nausea or vomiting., Disp: 20 tablet, Rfl: 6   propranolol ER (INDERAL LA) 160 MG SR capsule, Take 160 mg by mouth daily., Disp: ,  Rfl:    Rimegepant Sulfate (NURTEC) 75 MG TBDP, Take 1 tablet (75 mg total) by mouth as needed (take 1 tablet at onset of headache, max is 1 tablet in 24 hours)., Disp: 8 tablet, Rfl: 11   Semaglutide-Weight Management (WEGOVY) 2.4 MG/0.75ML SOAJ, Inject 2.4 mg into the skin once a week., Disp: 3 mL, Rfl: 3   sertraline (ZOLOFT) 100 MG tablet, Take 100 mg by mouth daily., Disp: , Rfl:    valACYclovir (VALTREX) 500 MG tablet, Take 1 tablet (500 mg total) by mouth daily. Tid during outbreaks, Disp: 100 tablet, Rfl: 1  Allergies  Allergen Reactions   Apple Itching and Swelling    ONLY certain "FRESH FRUITS" cause throat itches & in spring, throat feels like it will swell.  (apples, peaches, pears, plums, mango)   Prednisone Other (See Comments) and Palpitations    Severe mood swings and tachycardia   Amoxicillin-Pot Clavulanate Other (See Comments)    Mood swings Mood swings Mood swings   Tape Itching    I personally reviewed active problem list, medication list, allergies, family history, social history, health maintenance with the patient/caregiver today.   ROS  Ten systems reviewed and is negative except as mentioned in HPI   Objective  Vitals:   04/01/23 0905  BP: 124/68  Pulse: (!) 54  Resp: 16   SpO2: 98%  Weight: (!) 302 lb (137 kg)  Height: 5\' 5"  (1.651 m)    Body mass index is 50.26 kg/m.  Physical Exam  Constitutional: Patient appears well-developed and well-nourished. Obese  No distress.  HEENT: head atraumatic, normocephalic, pupils equal and reactive to light, neck supple Cardiovascular: Normal rate, regular rhythm and normal heart sounds.  No murmur heard. No BLE edema. Pulmonary/Chest: Effort normal and breath sounds normal. No respiratory distress. Abdominal: Soft.  There is no tenderness. Psychiatric: Patient has a normal mood and affect. behavior is normal. Judgment and thought content normal.    PHQ2/9:    04/01/2023    9:10 AM 03/24/2023    7:43 AM 12/11/2022   11:27 AM 12/10/2022    8:53 AM 12/09/2022    8:40 AM  Depression screen PHQ 2/9  Decreased Interest 1 1 0 0 0  Down, Depressed, Hopeless 1 0 0 0 0  PHQ - 2 Score 2 1 0 0 0  Altered sleeping 3 3 1 1  0  Tired, decreased energy 3 3 1 1  0  Change in appetite 0 0 0 0 0  Feeling bad or failure about yourself  0 0 0 0 0  Trouble concentrating 1 0 0 0 0  Moving slowly or fidgety/restless 0 0 0 0 0  Suicidal thoughts 0 0 0 0 0  PHQ-9 Score 9 7 2 2  0  Difficult doing work/chores   Not difficult at all Not difficult at all Not difficult at all    phq 9 is positive   Fall Risk:    04/01/2023    9:03 AM 03/24/2023    7:43 AM 12/11/2022   11:27 AM 12/10/2022    8:53 AM 12/09/2022    8:38 AM  Fall Risk   Falls in the past year? 0 0 0 0 0  Number falls in past yr: 0 0 0  0  Injury with Fall? 0 0 0  0  Risk for fall due to : No Fall Risks No Fall Risks No Fall Risks No Fall Risks   Follow up Falls prevention discussed Falls prevention discussed Falls prevention  discussed;Education provided;Falls evaluation completed Falls prevention discussed;Education provided     Functional Status Survey: Is the patient deaf or have difficulty hearing?: No Does the patient have difficulty seeing, even when wearing  glasses/contacts?: No Does the patient have difficulty concentrating, remembering, or making decisions?: Yes Does the patient have difficulty walking or climbing stairs?: No Does the patient have difficulty dressing or bathing?: No Does the patient have difficulty doing errands alone such as visiting a doctor's office or shopping?: No    Assessment & Plan  1. Morbid obesity with BMI of 50.0-59.9, adult (HCC)  - Semaglutide-Weight Management (WEGOVY) 2.4 MG/0.75ML SOAJ; Inject 2.4 mg into the skin once a week.  Dispense: 3 mL; Refill: 3  2. Moderate persistent asthma without complication  - montelukast (SINGULAIR) 10 MG tablet; Take 1 tablet (10 mg total) by mouth every evening.  Dispense: 90 tablet; Refill: 1 - budesonide-formoterol (SYMBICORT) 160-4.5 MCG/ACT inhaler; Inhale 2 puffs into the lungs 2 (two) times daily.  Dispense: 3 each; Refill: 1  3. Vitamin D deficiency  Reviewed labs and continue medication  4. B12 deficiency  It was low discussed supplementation   5. OSA on CPAP  We will refer her to sleep sub-specialist   6. Migraine without aura and responsive to treatment  Stable   7. Daytime somnolence

## 2023-04-01 ENCOUNTER — Ambulatory Visit: Payer: PRIVATE HEALTH INSURANCE | Admitting: Family Medicine

## 2023-04-01 ENCOUNTER — Encounter: Payer: Self-pay | Admitting: Family Medicine

## 2023-04-01 VITALS — BP 124/68 | HR 54 | Resp 16 | Ht 65.0 in | Wt 296.4 lb

## 2023-04-01 DIAGNOSIS — J4540 Moderate persistent asthma, uncomplicated: Secondary | ICD-10-CM

## 2023-04-01 DIAGNOSIS — R400 Somnolence: Secondary | ICD-10-CM

## 2023-04-01 DIAGNOSIS — E538 Deficiency of other specified B group vitamins: Secondary | ICD-10-CM | POA: Diagnosis not present

## 2023-04-01 DIAGNOSIS — Z6841 Body Mass Index (BMI) 40.0 and over, adult: Secondary | ICD-10-CM

## 2023-04-01 DIAGNOSIS — E559 Vitamin D deficiency, unspecified: Secondary | ICD-10-CM

## 2023-04-01 DIAGNOSIS — G43009 Migraine without aura, not intractable, without status migrainosus: Secondary | ICD-10-CM

## 2023-04-01 DIAGNOSIS — G4733 Obstructive sleep apnea (adult) (pediatric): Secondary | ICD-10-CM

## 2023-04-01 MED ORDER — WEGOVY 2.4 MG/0.75ML ~~LOC~~ SOAJ
2.4000 mg | SUBCUTANEOUS | 3 refills | Status: DC
Start: 2023-04-01 — End: 2023-08-04

## 2023-04-01 MED ORDER — BUDESONIDE-FORMOTEROL FUMARATE 160-4.5 MCG/ACT IN AERO: 2.0000 | INHALATION_SPRAY | Freq: Two times a day (BID) | RESPIRATORY_TRACT | 1 refills | Status: DC

## 2023-04-01 MED ORDER — MONTELUKAST SODIUM 10 MG PO TABS
10.0000 mg | ORAL_TABLET | Freq: Every evening | ORAL | 1 refills | Status: DC
Start: 2023-04-01 — End: 2023-08-04

## 2023-05-05 ENCOUNTER — Other Ambulatory Visit: Payer: Self-pay | Admitting: Family Medicine

## 2023-05-05 DIAGNOSIS — G4733 Obstructive sleep apnea (adult) (pediatric): Secondary | ICD-10-CM

## 2023-05-05 DIAGNOSIS — R400 Somnolence: Secondary | ICD-10-CM

## 2023-05-05 DIAGNOSIS — G43009 Migraine without aura, not intractable, without status migrainosus: Secondary | ICD-10-CM

## 2023-05-06 ENCOUNTER — Encounter: Payer: Self-pay | Admitting: Family Medicine

## 2023-05-21 ENCOUNTER — Telehealth: Payer: Self-pay | Admitting: Family Medicine

## 2023-05-21 NOTE — Telephone Encounter (Signed)
Copied from CRM [id number]. Topic: Referral - Question >> May 21, 2023 12:32 PM Everette C wrote: Reason for CRM: Patrice with [facility] has called regarding the patient's recent referral   The patient's chart indicates that they are seen by [facility] as well as [facility] for their sleep concerns currently   [facility] would like to know if the patient will be transferring care to their facility/organization for these concerns prior to scheduling   Please contact further when possible

## 2023-05-21 NOTE — Telephone Encounter (Signed)
Spoke with [facility] and confirmed patient does indeed want to transfer and proceed with referral.

## 2023-05-23 HISTORY — PX: TOOTH EXTRACTION: SUR596

## 2023-05-26 ENCOUNTER — Ambulatory Visit
Admission: EM | Admit: 2023-05-26 | Discharge: 2023-05-26 | Disposition: A | Discharge status: Home / Self Care | Payer: PRIVATE HEALTH INSURANCE

## 2023-05-26 DIAGNOSIS — J0100 Acute maxillary sinusitis, unspecified: Secondary | ICD-10-CM

## 2023-05-26 MED ORDER — AZITHROMYCIN 250 MG PO TABS: 250.0000 mg | ORAL_TABLET | Freq: Every day | ORAL | 0 refills | Status: DC

## 2023-05-26 NOTE — ED Triage Notes (Signed)
Patient to Urgent Care with complaints of sinus pain/ pressure/ dry cough/ sore throat/ nasal congestion.  Reports symptoms started 10 days ago. Max temp 99.7.  Using flonase/ otc allergy medications.

## 2023-05-26 NOTE — ED Provider Notes (Signed)
Renaldo Fiddler    CSN: [id number] Arrival date & time: 05/26/23  1902      History   Chief Complaint Chief Complaint  Patient presents with   Facial Pain    Sinus pressure, cough, sore throat, congestion. Pretty sure it's a sinus infection - Entered by patient    HPI Tracey Perry is a 32 y.o. female.  Patient presents with 10-day history of sinus pressure, congestion, sore throat, cough.  She reports low-grade fever of 99.7.  Treating with allergy medication and Flonase nasal spray.  She denies chest pain, shortness of breath, wheezing, or other symptoms.  Patient had an e-visit today; diagnosed with viral URI and was prescribed Bromfed-DM and Astelin nasal spray.  The history is provided by the patient and medical records.    Past Medical History:  Diagnosis Date   Allergy    Anxiety    Depression    Gastroparesis    Herpes simplex without complication    Morbid obesity due to excess calories (HCC)    PMDD (premenstrual dysphoric disorder)    Vertigo    Vitamin D deficiency     Patient Active Problem List   Diagnosis Date Noted   Intractable migraine with aura with status migrainosus 12/13/2022   B12 deficiency 11/26/2022   Moderate persistent allergic asthma without complication 06/07/2022   Morbid obesity with BMI of 50.0-59.9, adult (HCC) 05/08/2022   Episodic tension-type headache, not intractable 04/25/2021   Idiopathic stabbing headache 04/25/2021   Anxiety 12/29/2020   Cervical spondylosis with myelopathy and radiculopathy 12/11/2020   Cervical stenosis of spinal canal 12/11/2020   Chronic pain 12/06/2020   Cervical spinal stenosis 05/05/2020   Bulging of cervical intervertebral disc 05/05/2020   Cervical radiculopathy 05/05/2020   Bipolar 2 disorder (HCC) 12/15/2018   Fever blister 07/29/2016   Menorrhagia with irregular cycle 06/20/2016   OSA on CPAP 05/11/2016   Hypersomnia 12/25/2015   Cluster B personality disorder (HCC) 10/27/2015    H/O urinary tract infection 06/21/2015   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   Depression, major, recurrent, moderate (HCC) 06/21/2015   Gastroparesis 06/21/2015   Hx of cold sores 06/21/2015   Generalized anxiety disorder 05/31/2015   History of pyelonephritis 05/30/2015   Dysmenorrhea 05/30/2015   History of asthma 05/30/2015   Migraine without aura and responsive to treatment 05/30/2015   Allergic rhinitis, seasonal 05/30/2015   Vertigo 05/30/2015   IBS (irritable bowel syndrome) 09/20/2013   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   H/O cervical spine surgery 12/29/2012   Vitamin D deficiency 11/23/2009   History of mononucleosis 04/03/2009    Past Surgical History:  Procedure Laterality Date   ADENOIDECTOMY     anterior cervical decompression  Left 12/12/2020   revision with removal of hardware and decompression  - done at [facility]   APPENDECTOMY     CHOLECYSTECTOMY     COLONOSCOPY WITH PROPOFOL N/A 04/30/2021   Procedure: COLONOSCOPY WITH PROPOFOL;  Surgeon: Toney Reil, MD;  Location: [facility] ENDOSCOPY;  Service: Gastroenterology;  Laterality: N/A;   TONSILLECTOMY     TYMPANOSTOMY TUBE PLACEMENT      OB History   No obstetric history on file.      Home Medications    Prior to Admission medications   Medication Sig Start Date End Date Taking? Authorizing Provider  azithromycin (ZITHROMAX) 250 MG tablet Take 1 tablet (250 mg total) by mouth daily. Take first 2 tablets together, then 1 every day until finished. 05/26/23  Yes Wendee Beavers  H, NP  cholecalciferol (VITAMIN D3) 25 MCG (1000 UNIT) tablet Take 1,000 Units by mouth daily.   Yes [provider]  loratadine (CLARITIN) 10 MG tablet Take 10 mg by mouth daily.   Yes [provider]  amitriptyline (ELAVIL) 25 MG tablet Take 25 mg by mouth at bedtime. 08/13/21   [provider]  budesonide-formoterol (SYMBICORT) 160-4.5 MCG/ACT inhaler Inhale 2 puffs into the lungs 2 (two) times daily. 04/01/23    Alba Cory, MD  clonazePAM (KLONOPIN) 0.5 MG tablet Take 1 tablet (0.5 mg total) by mouth 2 (two) times daily. 11/29/15   Kerin Salen, MD  famotidine (PEPCID) 10 MG tablet Take by mouth.    [provider]  fluticasone (FLONASE) 50 MCG/ACT nasal spray Place into both nostrils daily.    [provider]  Fremanezumab-vfrm (AJOVY) 225 MG/1.5ML SOAJ Inject 225 mg into the skin every 30 (thirty) days. 12/16/22   Glean Salvo, NP  indomethacin (INDOCIN) 25 MG capsule Take 1 tablet PO Q8H x 2 weeks, the 2 PO Q8H x 2 weeks, then 3 PO Q8H. DO NOT CRUSH. TAKE WITH FOOD. 01/01/21   [provider]  ipratropium-albuterol (DUONEB) 0.5-2.5 (3) MG/3ML SOLN Take 3 mLs by nebulization every 4 (four) hours as needed. 10/25/22   Margarita Mail, DO  lamoTRIgine (LAMICTAL) 200 MG tablet Take 250 mg by mouth daily.    [provider]  levalbuterol Pauline Aus HFA) 45 MCG/ACT inhaler Inhale 2 puffs into the lungs 4 (four) times daily. 07/29/22   Alba Cory, MD  levocetirizine (XYZAL) 5 MG tablet Take by mouth.    [provider]  montelukast (SINGULAIR) 10 MG tablet Take 1 tablet (10 mg total) by mouth every evening. 04/01/23   Alba Cory, MD  norethindrone (AYGESTIN) 5 MG tablet Take 1 tablet (5 mg total) by mouth daily. 06/26/20   Alba Cory, MD  ondansetron (ZOFRAN-ODT) 8 MG disintegrating tablet Take 1 tablet (8 mg total) by mouth every 8 (eight) hours as needed for nausea or vomiting. 07/17/22   Ocie Doyne, MD  propranolol ER (INDERAL LA) 160 MG SR capsule Take 160 mg by mouth daily. 11/07/22   [provider]  Rimegepant Sulfate (NURTEC) 75 MG TBDP Take 1 tablet (75 mg total) by mouth as needed (take 1 tablet at onset of headache, max is 1 tablet in 24 hours). 12/10/22   Glean Salvo, NP  Semaglutide-Weight Management (WEGOVY) 2.4 MG/0.75ML SOAJ Inject 2.4 mg into the skin once a week. 04/01/23   Alba Cory, MD  sertraline (ZOLOFT)  100 MG tablet Take 100 mg by mouth daily. 06/14/22   [provider]  valACYclovir (VALTREX) 500 MG tablet Take 1 tablet (500 mg total) by mouth daily. Tid during outbreaks 01/21/23   Alba Cory, MD    Family History Family History  Problem Relation Age of Onset   Depression Mother    Hypertension Mother        controlled   Allergic rhinitis Mother    Anxiety disorder Mother    Diabetes Father        controlled   Hypertension Father    Allergic rhinitis Father    Depression Father    Depression Brother    Anxiety disorder Brother     Social History Social History   Tobacco Use   Smoking status: Never   Smokeless tobacco: Never  Vaping Use   Vaping Use: Never used  Substance Use Topics   Alcohol use: Not Currently  Alcohol/week: 0.0 standard drinks of alcohol   Drug use: No     Allergies   Apple, Prednisone, Amoxicillin-pot clavulanate, and Tape   Review of Systems Review of Systems  Constitutional:  Negative for chills and fever.  HENT:  Positive for congestion, postnasal drip, sinus pressure, sinus pain and sore throat. Negative for ear pain.   Respiratory:  Positive for cough. Negative for shortness of breath and wheezing.   Cardiovascular:  Negative for chest pain and palpitations.  Gastrointestinal:  Negative for diarrhea and vomiting.     Physical Exam Triage Vital Signs ED Triage Vitals [05/26/23 1913]  Enc Vitals Group     BP      Pulse      Resp      Temp      Temp src      SpO2      Weight      Height      Head Circumference      Peak Flow      Pain Score 3     Pain Loc      Pain Edu?      Excl. in GC?    No data found.  Updated Vital Signs BP 103/74   Pulse 93   Temp 98.2 F (36.8 C)   Resp 18   SpO2 98%   Visual Acuity Right Eye Distance:   Left Eye Distance:   Bilateral Distance:    Right Eye Near:   Left Eye Near:    Bilateral Near:     Physical Exam Vitals and nursing note reviewed.  Constitutional:       General: She is not in acute distress.    Appearance: She is well-developed. She is obese.  HENT:     Right Ear: Tympanic membrane normal.     Left Ear: Tympanic membrane normal.     Nose: Congestion present.     Mouth/Throat:     Mouth: Mucous membranes are moist.     Pharynx: Oropharynx is clear.  Cardiovascular:     Rate and Rhythm: Normal rate and regular rhythm.     Heart sounds: Normal heart sounds.  Pulmonary:     Effort: Pulmonary effort is normal. No respiratory distress.     Breath sounds: Normal breath sounds. No wheezing.  Musculoskeletal:     Cervical back: Neck supple.  Skin:    General: Skin is warm and dry.  Neurological:     Mental Status: She is alert.  Psychiatric:        Mood and Affect: Mood normal.        Behavior: Behavior normal.      UC Treatments / Results  Labs (all labs ordered are listed, but only abnormal results are displayed) Labs Reviewed - No data to display  EKG   Radiology No results found.  Procedures Procedures (including critical care time)  Medications Ordered in UC Medications - No data to display  Initial Impression / Assessment and Plan / UC Course  I have reviewed the triage vital signs and the nursing notes.  Pertinent labs & imaging results that were available during my care of the patient were reviewed by me and considered in my medical decision making (see chart for details).    Acute sinusitis.  Patient has been symptomatic for 10 days.  Treating today with Zithromax as she reports this has worked well for her in the past.  Education provided on sinus infection.  Instructed patient to follow  up with her PCP if her symptoms are not improving.  She agrees to plan of care.    Final Clinical Impressions(s) / UC Diagnoses   Final diagnoses:  Acute non-recurrent maxillary sinusitis     Discharge Instructions      Take the Zithromax as directed.  Follow up with your primary care provider if your symptoms  are not improving.        ED Prescriptions     Medication Sig Dispense Auth. Provider   azithromycin (ZITHROMAX) 250 MG tablet Take 1 tablet (250 mg total) by mouth daily. Take first 2 tablets together, then 1 every day until finished. 6 tablet Mickie Bail, NP      PDMP not reviewed this encounter.   Mickie Bail, NP 05/26/23 [phone]

## 2023-05-26 NOTE — Discharge Instructions (Addendum)
Take the Zithromax as directed.  Follow up with your primary care provider if your symptoms are not improving.   ° ° °

## 2023-06-04 ENCOUNTER — Ambulatory Visit: Payer: PRIVATE HEALTH INSURANCE | Admitting: Neurology

## 2023-06-16 ENCOUNTER — Telehealth: Payer: Self-pay

## 2023-06-16 ENCOUNTER — Other Ambulatory Visit (HOSPITAL_COMMUNITY): Payer: Self-pay

## 2023-06-16 NOTE — Telephone Encounter (Signed)
Received a renewal request via CMM-appears PT may be going to [facility] now? Please advise.

## 2023-08-01 NOTE — Progress Notes (Unsigned)
Name: Tracey Perry   MRN: [id number]    DOB: [birth year]   Date:08/04/2023       Progress Note  Subjective  Chief Complaint  Follow Up  HPI  Migraine headaches: She states migraine is stable, episodes about 2-3 times  a month but when severe needs to go to work late or leave, she will also start PT soon . She states she has photophobia and phonophobia and needs to be a in a quiet room. Sometimes unable to drive or function when migraine is present .  Daytime somnolence even on CPAP, TSH was normal   Asthma Moderate  persistent : she is not noticing as much SOB lately, no cough or wheezing. Currently on symbicort and singulair daily , saw pulmonologist and evaluation was negative - CT showed mild air trapping , she was referred to cardiologist and she felt like she brushed her off saying that it was panic attacks.   Obesity: she started on Wegovy April 2023 , start weight was 331 lbs, today is down t303 lbs, she achieved a loss of 10 % of her original weight. Weight went as lost as 294.6 lbs however having difficulty going down to that level again and would like to switch to Zepbound. We will try lower dose at first at 5 mg and titrate up as tolerated. She states she dropped her pants size , down from size 26 to 22-24  clothes are fitting loose  She has side effects of constipation but has controlled it with a stool softener 3 days a week . She states initially she had nausea but no longer having problems   OSA: she had initial study in 2017 and qualified for starting therapy however her insurance denied the treatment. She went back for sleep titration study that was done 02/10/2022 and came in today to discuss results however we don't have the report yet. She did not noticed a difference the day after the study. She feels tired constantly, never wakes up feeling rested.  We tried Provigil and took it for 2 weeks but cause her to feel very anxious and irritability , she tried [facility] for about 6  weeks but it caused irritability and did not help with daytime somnolence. She is no longer drinking caffeine to stay awake due to increase in anxiety She is now going to see a sleep medicine specialist to figure out why she is always so sleepy  Herpes type 1: doing well on prn valtrex, episodes about 4 times per year with suppressive therapy otherwise episodes lasts a long time and is very frequent   Bipolar disorder: she is feeling much better  stable on current dose of medications. She still sees psychiatrist . Stable, she also has seasonal affective disorder and use UV light therapy   Patient Active Problem List   Diagnosis Date Noted   Intractable migraine with aura with status migrainosus 12/13/2022   B12 deficiency 11/26/2022   Moderate persistent allergic asthma without complication 06/07/2022   Morbid obesity with BMI of 50.0-59.9, adult (HCC) 05/08/2022   Episodic tension-type headache, not intractable 04/25/2021   Idiopathic stabbing headache 04/25/2021   Anxiety 12/29/2020   Cervical spondylosis with myelopathy and radiculopathy 12/11/2020   Cervical stenosis of spinal canal 12/11/2020   Chronic pain 12/06/2020   Cervical spinal stenosis 05/05/2020   Bulging of cervical intervertebral disc 05/05/2020   Cervical radiculopathy 05/05/2020   Bipolar 2 disorder (HCC) 12/15/2018   Fever blister 07/29/2016   Menorrhagia with irregular  cycle 06/20/2016   OSA on CPAP 05/11/2016   Hypersomnia 12/25/2015   Cluster B personality disorder (HCC) 10/27/2015   H/O urinary tract infection 06/21/2015   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   Depression, major, recurrent, moderate (HCC) 06/21/2015   Gastroparesis 06/21/2015   Hx of cold sores 06/21/2015   Generalized anxiety disorder 05/31/2015   History of pyelonephritis 05/30/2015   Dysmenorrhea 05/30/2015   History of asthma 05/30/2015   Migraine without aura and responsive to treatment 05/30/2015   Allergic rhinitis, seasonal  05/30/2015   Vertigo 05/30/2015   IBS (irritable bowel syndrome) 09/20/2013   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   H/O cervical spine surgery 12/29/2012   Vitamin D deficiency 11/23/2009   History of mononucleosis 04/03/2009    Past Surgical History:  Procedure Laterality Date   ADENOIDECTOMY     anterior cervical decompression  Left 12/12/2020   revision with removal of hardware and decompression  - done at [facility]   APPENDECTOMY     CHOLECYSTECTOMY     COLONOSCOPY WITH PROPOFOL N/A 04/30/2021   Procedure: COLONOSCOPY WITH PROPOFOL;  Surgeon: Toney Reil, MD;  Location: [facility] ENDOSCOPY;  Service: Gastroenterology;  Laterality: N/A;   TONSILLECTOMY     TYMPANOSTOMY TUBE PLACEMENT      Family History  Problem Relation Age of Onset   Depression Mother    Hypertension Mother        controlled   Allergic rhinitis Mother    Anxiety disorder Mother    Diabetes Father        controlled   Hypertension Father    Allergic rhinitis Father    Depression Father    Depression Brother    Anxiety disorder Brother     Social History   Tobacco Use   Smoking status: Never   Smokeless tobacco: Never  Substance Use Topics   Alcohol use: Not Currently    Alcohol/week: 0.0 standard drinks of alcohol     Current Outpatient Medications:    amitriptyline (ELAVIL) 25 MG tablet, Take 25 mg by mouth at bedtime., Disp: , Rfl:    budesonide-formoterol (SYMBICORT) 160-4.5 MCG/ACT inhaler, Inhale 2 puffs into the lungs 2 (two) times daily., Disp: 3 each, Rfl: 1   cholecalciferol (VITAMIN D3) 25 MCG (1000 UNIT) tablet, Take 1,000 Units by mouth daily., Disp: , Rfl:    clonazePAM (KLONOPIN) 0.5 MG tablet, Take 1 tablet (0.5 mg total) by mouth 2 (two) times daily., Disp: 60 tablet, Rfl: 4   cyanocobalamin (VITAMIN B12) 250 MCG tablet, Take by mouth., Disp: , Rfl:    cyclobenzaprine (FLEXERIL) 5 MG tablet, PLEASE SEE ATTACHED FOR DETAILED DIRECTIONS, Disp: , Rfl:    famotidine (PEPCID) 10 MG tablet,  Take by mouth., Disp: , Rfl:    fluticasone (FLONASE) 50 MCG/ACT nasal spray, Place into both nostrils daily., Disp: , Rfl:    Fremanezumab-vfrm (AJOVY) 225 MG/1.5ML SOAJ, Inject 225 mg into the skin every 30 (thirty) days., Disp: 1.68 mL, Rfl: 11   ipratropium-albuterol (DUONEB) 0.5-2.5 (3) MG/3ML SOLN, Take 3 mLs by nebulization every 4 (four) hours as needed., Disp: 360 mL, Rfl: 2   lamoTRIgine (LAMICTAL) 200 MG tablet, Take 250 mg by mouth daily., Disp: , Rfl:    levalbuterol (XOPENEX HFA) 45 MCG/ACT inhaler, Inhale 2 puffs into the lungs 4 (four) times daily., Disp: 1 each, Rfl: 1   levocetirizine (XYZAL) 5 MG tablet, Take by mouth., Disp: , Rfl:    loratadine (CLARITIN) 10 MG tablet, Take 10 mg by mouth daily.,  Disp: , Rfl:    norethindrone (AYGESTIN) 5 MG tablet, Take 1 tablet (5 mg total) by mouth daily., Disp: 28 tablet, Rfl: 0   propranolol ER (INDERAL LA) 160 MG SR capsule, Take 160 mg by mouth daily., Disp: , Rfl:    Rimegepant Sulfate (NURTEC) 75 MG TBDP, Take 1 tablet (75 mg total) by mouth as needed (take 1 tablet at onset of headache, max is 1 tablet in 24 hours)., Disp: 8 tablet, Rfl: 11   sertraline (ZOLOFT) 100 MG tablet, Take 100 mg by mouth daily., Disp: , Rfl:    tirzepatide (ZEPBOUND) 5 MG/0.5ML Pen, Inject 5 mg into the skin once a week., Disp: 2 mL, Rfl: 0   montelukast (SINGULAIR) 10 MG tablet, Take 1 tablet (10 mg total) by mouth every evening., Disp: 90 tablet, Rfl: 1   ondansetron (ZOFRAN-ODT) 8 MG disintegrating tablet, Take 1 tablet (8 mg total) by mouth every 8 (eight) hours as needed for nausea or vomiting. (Patient not taking: Reported on 08/04/2023), Disp: 20 tablet, Rfl: 6   valACYclovir (VALTREX) 500 MG tablet, Take 1 tablet (500 mg total) by mouth daily. Tid during outbreaks, Disp: 100 tablet, Rfl: 1  Allergies  Allergen Reactions   Apple Itching and Swelling    ONLY certain "FRESH FRUITS" cause throat itches & in spring, throat feels like it will swell.   (apples, peaches, pears, plums, mango)   Prednisone Other (See Comments) and Palpitations    Severe mood swings and tachycardia   Amoxicillin-Pot Clavulanate Other (See Comments)    Mood swings Mood swings Mood swings   Tape Itching    I personally reviewed active problem list, medication list, allergies, family history, social history, health maintenance with the patient/caregiver today.   ROS  Ten systems reviewed and is negative except as mentioned in HPI    Objective  Vitals:   08/04/23 0809  BP: 126/72  Pulse: 92  Resp: 18  Temp: 98 F (36.7 C)  TempSrc: Oral  SpO2: 96%  Weight: (!) 303 lb 6.4 oz (137.6 kg)  Height: 5\' 5"  (1.651 m)    Body mass index is 50.49 kg/m.  Physical Exam  Constitutional: Patient appears well-developed and well-nourished. Obese  No distress.  HEENT: head atraumatic, normocephalic, pupils equal and reactive to light, neck supple Cardiovascular: Normal rate, regular rhythm and normal heart sounds.  No murmur heard. No BLE edema. Pulmonary/Chest: Effort normal and breath sounds normal. No respiratory distress. Abdominal: Soft.  There is no tenderness. Psychiatric: Patient has a normal mood and affect. behavior is normal. Judgment and thought content normal.    PHQ2/9:    08/04/2023    8:12 AM 04/01/2023    9:10 AM 03/24/2023    7:43 AM 12/11/2022   11:27 AM 12/10/2022    8:53 AM  Depression screen PHQ 2/9  Decreased Interest 1 1 1  0 0  Down, Depressed, Hopeless 1 1 0 0 0  PHQ - 2 Score 2 2 1  0 0  Altered sleeping 3 3 3 1 1   Tired, decreased energy 3 3 3 1 1   Change in appetite 1 0 0 0 0  Feeling bad or failure about yourself  0 0 0 0 0  Trouble concentrating 1 1 0 0 0  Moving slowly or fidgety/restless 0 0 0 0 0  Suicidal thoughts 0 0 0 0 0  PHQ-9 Score 10 9 7 2 2   Difficult doing work/chores Somewhat difficult   Not difficult at all Not difficult at  all    phq 9 is positive   Fall Risk:    08/04/2023    8:12 AM 04/01/2023     9:03 AM 03/24/2023    7:43 AM 12/11/2022   11:27 AM 12/10/2022    8:53 AM  Fall Risk   Falls in the past year? 1 0 0 0 0  Number falls in past yr: 0 0 0 0   Injury with Fall? 0 0 0 0   Risk for fall due to : History of fall(s) No Fall Risks No Fall Risks No Fall Risks No Fall Risks  Follow up Falls prevention discussed;Education provided;Falls evaluation completed Falls prevention discussed Falls prevention discussed Falls prevention discussed;Education provided;Falls evaluation completed Falls prevention discussed;Education provided      Functional Status Survey: Is the patient deaf or have difficulty hearing?: No Does the patient have difficulty seeing, even when wearing glasses/contacts?: No Does the patient have difficulty concentrating, remembering, or making decisions?: No Does the patient have difficulty walking or climbing stairs?: No Does the patient have difficulty dressing or bathing?: No Does the patient have difficulty doing errands alone such as visiting a doctor's office or shopping?: No    Assessment & Plan  1. Mild persistent asthma without complication  - valACYclovir (VALTREX) 500 MG tablet; Take 1 tablet (500 mg total) by mouth daily. Tid during outbreaks  Dispense: 100 tablet; Refill: 1  2. Morbid obesity with BMI of 50.0-59.9, adult (HCC)  - tirzepatide (ZEPBOUND) 5 MG/0.5ML Pen; Inject 5 mg into the skin once a week.  Dispense: 2 mL; Refill: 0  3. Moderate persistent asthma without complication  - montelukast (SINGULAIR) 10 MG tablet; Take 1 tablet (10 mg total) by mouth every evening.  Dispense: 90 tablet; Refill: 1  4. Migraine without aura and responsive to treatment  Under the care of neurologist   5. OSA on CPAP  Stable  6. Daytime somnolence   Going to see sub specialist in October

## 2023-08-04 ENCOUNTER — Ambulatory Visit: Payer: PRIVATE HEALTH INSURANCE | Admitting: Family Medicine

## 2023-08-04 ENCOUNTER — Encounter: Payer: Self-pay | Admitting: Family Medicine

## 2023-08-04 VITALS — BP 126/72 | HR 92 | Temp 98.0°F | Resp 18 | Ht 65.0 in | Wt 303.4 lb

## 2023-08-04 DIAGNOSIS — R400 Somnolence: Secondary | ICD-10-CM

## 2023-08-04 DIAGNOSIS — G43009 Migraine without aura, not intractable, without status migrainosus: Secondary | ICD-10-CM

## 2023-08-04 DIAGNOSIS — Z6841 Body Mass Index (BMI) 40.0 and over, adult: Secondary | ICD-10-CM

## 2023-08-04 DIAGNOSIS — J4540 Moderate persistent asthma, uncomplicated: Secondary | ICD-10-CM

## 2023-08-04 DIAGNOSIS — J4530 Mild persistent asthma, uncomplicated: Secondary | ICD-10-CM | POA: Diagnosis not present

## 2023-08-04 DIAGNOSIS — G4733 Obstructive sleep apnea (adult) (pediatric): Secondary | ICD-10-CM

## 2023-08-04 MED ORDER — ZEPBOUND 5 MG/0.5ML ~~LOC~~ SOAJ
5.0000 mg | SUBCUTANEOUS | 0 refills | Status: DC
Start: 2023-08-04 — End: 2023-09-01

## 2023-08-04 MED ORDER — VALACYCLOVIR HCL 500 MG PO TABS
500.0000 mg | ORAL_TABLET | Freq: Every day | ORAL | 1 refills | Status: DC
Start: 2023-08-04 — End: 2023-10-24

## 2023-08-04 MED ORDER — MONTELUKAST SODIUM 10 MG PO TABS: 10.0000 mg | ORAL_TABLET | Freq: Every evening | ORAL | 1 refills | Status: DC

## 2023-08-26 ENCOUNTER — Ambulatory Visit: Payer: PRIVATE HEALTH INSURANCE | Admitting: Allergy and Immunology

## 2023-08-26 ENCOUNTER — Other Ambulatory Visit: Payer: Self-pay

## 2023-08-26 ENCOUNTER — Encounter: Payer: Self-pay | Admitting: Allergy and Immunology

## 2023-08-26 VITALS — BP 110/76 | HR 95 | Temp 99.5°F | Resp 16 | Ht 65.25 in | Wt 314.4 lb

## 2023-08-26 DIAGNOSIS — J3089 Other allergic rhinitis: Secondary | ICD-10-CM

## 2023-08-26 DIAGNOSIS — L500 Allergic urticaria: Secondary | ICD-10-CM

## 2023-08-26 DIAGNOSIS — T781XXD Other adverse food reactions, not elsewhere classified, subsequent encounter: Secondary | ICD-10-CM

## 2023-08-26 DIAGNOSIS — J301 Allergic rhinitis due to pollen: Secondary | ICD-10-CM | POA: Diagnosis not present

## 2023-08-26 DIAGNOSIS — J4540 Moderate persistent asthma, uncomplicated: Secondary | ICD-10-CM

## 2023-08-26 DIAGNOSIS — J4520 Mild intermittent asthma, uncomplicated: Secondary | ICD-10-CM

## 2023-08-26 MED ORDER — SPACER/AERO-HOLDING CHAMBERS DEVI: 1.0000 | 1 refills | Status: AC

## 2023-08-26 MED ORDER — LORATADINE 10 MG PO TABS: 10.0000 mg | ORAL_TABLET | Freq: Every day | ORAL | 1 refills | Status: DC | PRN

## 2023-08-26 MED ORDER — LEVOCETIRIZINE DIHYDROCHLORIDE 5 MG PO TABS: 5.0000 mg | ORAL_TABLET | Freq: Every day | ORAL | 1 refills | Status: DC | PRN

## 2023-08-26 MED ORDER — BUDESONIDE-FORMOTEROL FUMARATE 160-4.5 MCG/ACT IN AERO
2.0000 | INHALATION_SPRAY | Freq: Two times a day (BID) | RESPIRATORY_TRACT | 1 refills | Status: AC
Start: 2023-08-26 — End: ?

## 2023-08-26 MED ORDER — OLOPATADINE HCL 0.2 % OP SOLN: 1.0000 [drp] | OPHTHALMIC | 1 refills | Status: DC

## 2023-08-26 MED ORDER — RYALTRIS 665-25 MCG/ACT NA SUSP: 1.0000 | Freq: Two times a day (BID) | NASAL | 5 refills | Status: DC

## 2023-08-26 NOTE — Patient Instructions (Addendum)
  1. Blood - CBC w/d, area 2 aeroallergen profile  2. Treat and prevent inflammation of airway:   A. Symbicort 160 - 2 inhalations 1-2 times per day w/ spacer (empty lungs)  B. Ryaltris - 1 spray each nostril 1-2 times per day (SP)  C. Montelukast 10 mg - 1 tablet 1 time per day  3. If needed:   A. Epi-pen, benadryl, MD/ER evaluation for allergic reaction  B. Xyzal / Claritin - 1-2 times per day  C. Pataday - 1 drop each eye 1 time per day  D. Symbicort 160 - 2 inhalations every 6 hours  4. Immunotherapy ???

## 2023-08-26 NOTE — Progress Notes (Unsigned)
5 

## 2023-08-26 NOTE — Progress Notes (Unsigned)
[place] - [place] - [place] - [place] - [place]   Dear Tracey Perry,  Thank you for referring Tracey Perry to the [facility] [place] on 08/26/2023.   Below is a summation of this patient's evaluation and recommendations.  Thank you for your referral. I will keep you informed about this patient's response to treatment.   If you have any questions please do not hesitate to contact me.   Sincerely,  Jessica Priest, MD Allergy / Immunology [facility]   ______________________________________________________________________    NEW PATIENT NOTE  Referring Provider: Alba Cory, MD Primary Provider: Alba Cory, MD Date of office visit: 08/26/2023    Subjective:   Chief Complaint:  Tracey Perry (DOB: 1991-06-10) is a 32 y.o. female who presents to the clinic on 08/26/2023 with a chief complaint of Allergic Rhinitis  (Has issues with runny eyes and nose. Also has issues sneezing. ), Asthma (Says when her allergies are bas Tracey Perry does have issues taking a deep breath in. ), and Urticaria (If Tracey Perry doesn't take an antihistamine daily Tracey Perry breaks out in hives all over.) .     HPI: Tracey Perry presents to this clinic in evaluation of multiple issues.  First, Tracey Perry has a long history of nasal congestion and itchy eyes and throat itching and mouth itching and sneezing that occurs on a perennial basis that flares during the spring and sometimes the fall especially following exposure to pollens and cats.  Tracey Perry uses Flonase during the spring and Tracey Perry continues on a collection of other agents including a leukotriene modifier and nasal steroid yet still remains symptomatic.  Second, Tracey Perry has a history of asthma that for the most part resolved at the age of 30 but returned in 2022 after a bout of COVID.  Tracey Perry describes her asthma is inability to get a deep breath and some air hunger.  When Tracey Perry uses  albuterol works about 50% of the time.  Unfortunately, Tracey Perry does not use much albuterol because Tracey Perry gets a tachycardia when using albuterol.  These issues occur while using Symbicort about 9 times per week on average.  Tracey Perry did require an emergency room evaluation for her asthma in October 2022.  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  Fourth, Tracey Perry has a history of antihistamine controlled hives.  If Tracey Perry stops her Xyzal and Claritin Tracey Perry will develop hives without any associated systemic or constitutional symptoms.  Tracey Perry is apparently undergone a course of immunotherapy during her college years for about 1 year but because of a logistical issue did not complete this form of treatment.  Tracey Perry has had this years flu and COVID-vaccine.   Past Medical History:  Diagnosis Date   Allergy    Anxiety    Depression    Gastroparesis    Herpes simplex without complication    Morbid obesity due to excess calories (HCC)    PMDD (premenstrual dysphoric disorder)    Vertigo    Vitamin D deficiency     Past Surgical History:  Procedure Laterality Date   ADENOIDECTOMY  05/2000   anterior cervical decompression  Left 12/12/2020   revision with removal of hardware and decompression  - done at [facility]   APPENDECTOMY  03/2013   CHOLECYSTECTOMY  09/2009   COLONOSCOPY WITH PROPOFOL N/A 04/30/2021   Procedure: COLONOSCOPY WITH PROPOFOL;  Surgeon:  Toney Reil, MD;  Location: [facility] ENDOSCOPY;  Service: Gastroenterology;  Laterality: N/A;   TONSILLECTOMY  05/2000   TYMPANOSTOMY TUBE PLACEMENT      Allergies as of 08/26/2023       Reactions   Amoxicillin-pot Clavulanate Other (See Comments)   Mood swings Mood swings Mood swings   Apple Itching, Swelling   ONLY certain "FRESH FRUITS" cause throat itches & in spring, throat feels like it will swell.  (apples, peaches, pears, plums, mango)   Prednisone  Palpitations, Other (See Comments)   Severe mood swings and tachycardia   Tape Itching        Medication List    Ajovy 225 MG/1.5ML Soaj Generic drug: Fremanezumab-vfrm Inject 225 mg into the skin every 30 (thirty) days.   amitriptyline 25 MG tablet Commonly known as: ELAVIL Take 25 mg by mouth at bedtime.   budesonide-formoterol 160-4.5 MCG/ACT inhaler Commonly known as: SYMBICORT Inhale 2 puffs into the lungs 2 (two) times daily.   cholecalciferol 25 MCG (1000 UNIT) tablet Commonly known as: VITAMIN D3 Take 1,000 Units by mouth daily.   clonazePAM 0.5 MG tablet Commonly known as: KlonoPIN Take 1 tablet (0.5 mg total) by mouth 2 (two) times daily.   cyanocobalamin 250 MCG tablet Commonly known as: VITAMIN B12 Take by mouth.   fluticasone 50 MCG/ACT nasal spray Commonly known as: FLONASE Place into both nostrils daily.   ipratropium-albuterol 0.5-2.5 (3) MG/3ML Soln Commonly known as: DUONEB Take 3 mLs by nebulization every 4 (four) hours as needed.   lamoTRIgine 200 MG tablet Commonly known as: LAMICTAL Take 250 mg by mouth daily.   levocetirizine 5 MG tablet Commonly known as: XYZAL Take 1 tablet (5 mg total) by mouth daily as needed for allergies (Can take an extra dose during flare ups.).   loratadine 10 MG tablet Commonly known as: CLARITIN Take 1 tablet (10 mg total) by mouth daily as needed for allergies (Can take an extra dose during flare ups.).   montelukast 10 MG tablet Commonly known as: SINGULAIR Take 1 tablet (10 mg total) by mouth every evening.   norethindrone 5 MG tablet Commonly known as: AYGESTIN Take 1 tablet (5 mg total) by mouth daily.   Nurtec 75 MG Tbdp Generic drug: Rimegepant Sulfate Take 1 tablet (75 mg total) by mouth as needed (take 1 tablet at onset of headache, max is 1 tablet in 24 hours).   Olopatadine HCl 0.2 % Soln Commonly known as: Pataday Place 1 drop into both eyes 1 day or 1 dose. Started by: Rinoa Garramone J Serafina Topham    ondansetron 8 MG disintegrating tablet Commonly known as: ZOFRAN-ODT Take 1 tablet (8 mg total) by mouth every 8 (eight) hours as needed for nausea or vomiting.   propranolol ER 160 MG SR capsule Commonly known as: INDERAL LA Take 160 mg by mouth daily.   Ryaltris 130-86 MCG/ACT Susp Generic drug: Olopatadine-Mometasone Place 1 spray into the nose 2 (two) times daily. Started by: Shonya Sumida J Daegon Deiss   sertraline 100 MG tablet Commonly known as: ZOLOFT Take 100 mg by mouth daily.   Slynd 4 MG Tabs Generic drug: Drospirenone Take 4 mg by mouth daily.   Spacer/Aero-Holding [employer] 1 Device by Does not apply route as directed. Started by: Marialuisa Basara J Marquon Alcala   valACYclovir 500 MG tablet Commonly known as: VALTREX Take 1 tablet (500 mg total) by mouth daily. Tid during outbreaks   Zepbound 5 MG/0.5ML Pen Generic drug: tirzepatide Inject 5 mg into the skin  once a week.    Review of systems negative except as noted in HPI / PMHx or noted below:  Review of Systems  Constitutional: Negative.   HENT: Negative.    Eyes: Negative.   Respiratory: Negative.    Cardiovascular: Negative.   Gastrointestinal: Negative.   Genitourinary: Negative.   Musculoskeletal: Negative.   Skin: Negative.   Neurological: Negative.   Endo/Heme/Allergies: Negative.   Psychiatric/Behavioral: Negative.      Family History  Problem Relation Age of Onset   Urticaria Mother    Depression Mother    Hypertension Mother        controlled   Allergic rhinitis Mother    Anxiety disorder Mother    Diabetes Father        controlled   Hypertension Father    Allergic rhinitis Father    Depression Father    Depression Brother    Anxiety disorder Brother    Allergic rhinitis Maternal Grandmother     Social History   Socioeconomic History   Marital status: Single    Spouse name: Not on file   Number of children: 0   Years of education: Not on file   Highest education level: Master's degree (e.g.,  MA, MS, MEng, MEd, MSW, MBA)  Occupational History   Occupation: retail     Comment: Tracey 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   Social Determinants of Health   Financial Resource Strain: Low Risk  (03/31/2023)   Overall Financial Resource Strain (CARDIA)    Difficulty of Paying Living Expenses: Not very hard  Food Insecurity: No Food Insecurity (03/31/2023)   Hunger Vital Sign    Worried About Running Out of Food in the Last Year: Never true    Ran Out of Food in the Last Year: Never true  Transportation Needs: No Transportation Needs (03/31/2023)   PRAPARE - Administrator, Civil Service (Medical): No    Lack of Transportation (Non-Medical): No  Physical Activity: Inactive (03/31/2023)   Exercise Vital Sign    Days of Exercise per Week: 0 days    Minutes of Exercise per Session: 0 min  Stress: Stress Concern Present (03/31/2023)   [employer] of Occupational Health - Occupational Stress Questionnaire    Feeling of Stress : To some extent  Social Connections: Socially Isolated (03/31/2023)   Social Connection and Isolation Panel [NHANES]    Frequency of Communication with Friends and Family: More than three times a week    Frequency of Social Gatherings with Friends and Family: Once a week    Attends Religious Services: Never    Database administrator or Organizations: No    Attends Banker Meetings: Never    Marital Status: Never married  Intimate Partner Violence: Not At Risk (03/24/2023)   Humiliation,  Afraid, Rape, and Kick questionnaire    Fear of Current or Ex-Partner: No    Emotionally Abused: No    Physically  Abused: No    Sexually Abused: No    Environmental and Social history  Lives in a house with a dry environment, cats located inside the household, no carpet in the bedroom, no plastic on the bed, no plastic on the pillow, no smoking ongoing with inside the household.  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  HENT:     Head: Normocephalic.     Right Ear: Tympanic membrane, ear canal and external ear normal.     Left Ear: Tympanic membrane, ear canal and external ear normal.     Nose: Nose normal. No mucosal edema or rhinorrhea.     Mouth/Throat:     Pharynx: Uvula midline. No oropharyngeal exudate.  Eyes:     Conjunctiva/sclera: Conjunctivae normal.  Neck:     Thyroid: No thyromegaly.     Trachea: Trachea normal. No tracheal tenderness or tracheal deviation.  Cardiovascular:     Rate and Rhythm: Normal rate and regular rhythm.     Heart sounds: Normal heart sounds, S1 normal and S2 normal. No murmur heard. Pulmonary:     Effort: No respiratory distress.     Breath sounds: Normal breath sounds. No stridor. No wheezing or rales.  Lymphadenopathy:     Head:     Right side of head: No tonsillar adenopathy.     Left side of head: No tonsillar adenopathy.     Cervical: No cervical adenopathy.  Skin:    Findings: No erythema or rash.     Nails: There is no clubbing.  Neurological:     Mental Status: Tracey Perry is alert.     Diagnostics: Allergy skin tests were performed.   Spirometry was performed and demonstrated an FEV1 of 3.28 @ 101 % of predicted. FEV1/FVC = 0.86   Assessment and Plan:    1.  Asthma, mild intermittent, well-controlled   2. Perennial allergic rhinitis   3. Seasonal allergic rhinitis due to pollen   4. Pollen-food allergy, subsequent encounter   5. Allergic urticaria   6. Moderate persistent asthma without complication    1. Blood - CBC w/d, area 2 aeroallergen profile  2. Treat and prevent inflammation of airway:   A. Symbicort 160 - 2 inhalations 1-2 times per day w/ spacer (empty lungs)  B. Ryaltris - 1 spray each nostril 1-2 times per day (SP)  C. Montelukast 10 mg - 1 tablet 1 time per day  3. If needed:   A. Epi-pen, benadryl, MD/ER evaluation for allergic reaction  B. Xyzal / Claritin - 1-2 times per day  C. Pataday - 1 drop each eye 1 time per day  D. Symbicort 160 - 2 inhalations every 6 hours  4. Immunotherapy ???  Santina Evans has multiorgan atopic disease that is not under good control with her current medical plan and we will see if Tracey Perry is a candidate for immunotherapy once we can review the results of the blood test ordered today.  Tracey Perry will remain on anti-inflammatory agents for both her upper and lower airway as noted above.  Tracey Perry can use Symbicort as her anti-inflammatory rescue medicine should it be required.  I will contact her with the  results of her blood test once they are available for review.  Jessica Priest, MD Allergy / Immunology [facility] [place]

## 2023-08-28 LAB — CBC WITH DIFFERENTIAL/PLATELET
Basophils Absolute: 0.1 10*3/uL (ref 0.0–0.2)
Basos: 1 %
EOS (ABSOLUTE): 0.2 10*3/uL (ref 0.0–0.4)
Eos: 3 %
Hematocrit: 40.3 % (ref 34.0–46.6)
Hemoglobin: 12.8 g/dL (ref 11.1–15.9)
Immature Grans (Abs): 0 10*3/uL (ref 0.0–0.1)
Immature Granulocytes: 0 %
Lymphocytes Absolute: 1.7 10*3/uL (ref 0.7–3.1)
Lymphs: 21 %
MCH: 31.1 pg (ref 26.6–33.0)
MCHC: 31.8 g/dL (ref 31.5–35.7)
MCV: 98 fL — ABNORMAL HIGH (ref 79–97)
Monocytes Absolute: 0.5 10*3/uL (ref 0.1–0.9)
Monocytes: 6 %
Neutrophils Absolute: 5.7 10*3/uL (ref 1.4–7.0)
Neutrophils: 69 %
Platelets: 257 10*3/uL (ref 150–450)
RBC: 4.11 x10E6/uL (ref 3.77–5.28)
RDW: 12.2 % (ref 11.7–15.4)
WBC: 8.1 10*3/uL (ref 3.4–10.8)

## 2023-08-28 LAB — ALLERGENS W/TOTAL IGE AREA 2
Bermuda Grass IgE: 0.38 kU/L — AB
Cat Dander IgE: 2.6 kU/L — AB
Common Silver Birch IgE: 2.29 kU/L — AB
D Farinae IgE: 0.28 kU/L — AB
D Pteronyssinus IgE: 0.25 kU/L — AB
Dog Dander IgE: 0.43 kU/L — AB
Elm, American IgE: 0.38 kU/L — AB
IgE (Immunoglobulin E), Serum: 68 [IU]/mL (ref 6–495)
Johnson Grass IgE: 0.4 kU/L — AB
Maple/Box Elder IgE: 0.24 kU/L — AB
Mouse Urine IgE: 0.12 kU/L — AB
Oak, White IgE: 2.16 kU/L — AB
Pecan, Hickory IgE: 1.74 kU/L — AB
Ragweed, Short IgE: 3.78 kU/L — AB
Sheep Sorrel IgE Qn: 0.14 kU/L — AB
Timothy Grass IgE: 1.09 kU/L — AB

## 2023-08-31 ENCOUNTER — Encounter: Payer: Self-pay | Admitting: Family Medicine

## 2023-09-01 ENCOUNTER — Other Ambulatory Visit: Payer: Self-pay | Admitting: Family Medicine

## 2023-09-01 MED ORDER — ZEPBOUND 7.5 MG/0.5ML ~~LOC~~ SOAJ: 7.5000 mg | SUBCUTANEOUS | 0 refills | Status: DC

## 2023-09-10 ENCOUNTER — Other Ambulatory Visit: Payer: Self-pay | Admitting: Allergy and Immunology

## 2023-09-10 DIAGNOSIS — J3089 Other allergic rhinitis: Secondary | ICD-10-CM | POA: Diagnosis not present

## 2023-09-10 DIAGNOSIS — J301 Allergic rhinitis due to pollen: Secondary | ICD-10-CM

## 2023-09-10 NOTE — Progress Notes (Signed)
Aeroallergen Immunotherapy   Ordering Provider: Dr. Laurette Schimke   Patient Details  Name: Tracey Perry  MRN: [id number]  Date of Birth: 1991-06-02   Order one of two   Vial Label: dust mite, weed   0.3 ml (Volume)  1:20 Concentration -- Ragweed Mix  0.2 ml (Volume)  1:10 Concentration -- Sheep Sorrell*  0.5 ml (Volume)   AU Concentration -- Mite Mix (DF 5,000 & DP 5,000)    1.0  ml Extract Subtotal  4.0  ml Diluent  5.0  ml Maintenance Total   Blue Vial (1:100,000): Schedule B (6 doses)  Yellow Vial (1:10,000): Schedule B (6 doses)  Green Vial (1:1,000): Schedule B (6 doses)  Red Vial (1:100): Schedule A (12 doses)   Special Instructions: 1-2 times per week

## 2023-09-10 NOTE — Progress Notes (Signed)
Aeroallergen Immunotherapy  Ordering Provider: Dr. Laurette Schimke  Patient Details Name: Tracey Perry MRN: [id number] Date of Birth: 29-Dec-1990  Order two of two  Vial Label: tree, grass, cat, dog  0.3 ml (Volume)  BAU Concentration -- 7 Grass Mix* 100,000 ([address] [place], [place], [place], [place] Rye, RedTop, Sweet Vernal, Timothy) 0.1 ml (Volume)  BAU Concentration -- [place] Territ[place] 10,000 0.1 ml (Volume)  1:20 Concentration -- Johnson 0.2 ml (Volume)  1:10 Concentration -- Charletta Cousin mix* 0.2 ml (Volume)  1:20 Concentration -- Maple Mix* 0.2 ml (Volume)  1:10 Concentration -- Oak, [place] mix* 0.2 ml (Volume)  1:10 Concentration -- Pecan Pollen 0.3 ml (Volume)  1:10 Concentration -- Cat Hair 0.3 ml (Volume)  1:10 Concentration -- Dog Epithelia   1.9  ml Extract Subtotal 3.1  ml Diluent 5.0  ml Maintenance Total  Blue Vial (1:100,000): Schedule B (6 doses) Yellow Vial (1:10,000): Schedule B (6 doses) Green Vial (1:1,000): Schedule B (6 doses) Red Vial (1:100): Schedule A (12 doses)  Special Instructions: 1-2 times per week

## 2023-09-10 NOTE — Progress Notes (Signed)
VIALS EXP 09-09-24

## 2023-09-11 DIAGNOSIS — J3081 Allergic rhinitis due to animal (cat) (dog) hair and dander: Secondary | ICD-10-CM | POA: Diagnosis not present

## 2023-09-22 ENCOUNTER — Ambulatory Visit: Payer: Self-pay

## 2023-09-22 NOTE — Telephone Encounter (Signed)
        Chief Complaint: Left Great toe red, swollen, painful Symptoms: Above Frequency: Last week Pertinent Negatives: Patient denies fever Disposition: [] ED /[] Urgent Care (no appt availability in office) / [x] Appointment(In office/virtual)/ []  [facility]/ [] Home Care/ [] Refused Recommended Disposition /[] [place] [facility]/ []  Follow-up with PCP Additional Notes: Request appointment Wednesday due to work.  Reason for Disposition  Localized redness and swelling of skin around nail (i.e., cuticle area or nail fold)  Answer Assessment - Initial Assessment Questions 1. ONSET: "When did the pain start?"      Last week  2. LOCATION: "Where is the pain located?"   (e.g., around nail, entire toe, at foot joint)      Left Great toe 3. PAIN: "How bad is the pain?"    (Scale 1-10; or mild, moderate, severe)   -  MILD (1-3): doesn't interfere with normal activities    -  MODERATE (4-7): interferes with normal activities (e.g., work or school) or awakens from sleep, limping    -  SEVERE (8-10): excruciating pain, unable to do any normal activities, unable to walk     6 4. APPEARANCE: "What does the toe look like?" (e.g., redness, swelling, bruising, pallor)     Swelling, red, oozing 5. CAUSE: "What do you think is causing the toe pain?"     Toenail 6. OTHER SYMPTOMS: "Do you have any other symptoms?" (e.g., leg pain, rash, fever, numbness)     No 7. PREGNANCY: "Is there any chance you are pregnant?" "When was your last menstrual period?"     No  Protocols used: Toe Pain-A-AH

## 2023-09-23 NOTE — Progress Notes (Unsigned)
There were no vitals taken for this visit.   Subjective:    Patient ID: Tracey Perry, female    DOB: 21-Sep-1991, 32 y.o.   MRN: [id number]  HPI: Tracey Perry is a 32 y.o. female  No chief complaint on file.   Relevant past medical, surgical, family and social history reviewed and updated as indicated. Interim medical history since our last visit reviewed. Allergies and medications reviewed and updated.  Review of Systems  Ten systems reviewed and is negative except as mentioned in HPI ***      Objective:    There were no vitals taken for this visit.  Wt Readings from Last 3 Encounters:  08/26/23 (!) 314 lb 6.4 oz (142.6 kg)  08/04/23 (!) 303 lb 6.4 oz (137.6 kg)  04/01/23 296 lb 6.4 oz (134.4 kg)    Physical Exam  Constitutional: Patient appears well-developed and well-nourished. Obese *** No distress.  HEENT: head atraumatic, normocephalic, pupils equal and reactive to light, ears ***, neck supple, throat within normal limits Cardiovascular: Normal rate, regular rhythm and normal heart sounds.  No murmur heard. No BLE edema. Pulmonary/Chest: Effort normal and breath sounds normal. No respiratory distress. Abdominal: Soft.  There is no tenderness. Psychiatric: Patient has a normal mood and affect. behavior is normal. Judgment and thought content normal.  Results for orders placed or performed in visit on 08/26/23  CBC with Differential  Result Value Ref Range   WBC 8.1 3.4 - 10.8 x10E3/uL   RBC 4.11 3.77 - 5.28 x10E6/uL   Hemoglobin 12.8 11.1 - 15.9 g/dL   Hematocrit 13.0 86.5 - 46.6 %   MCV 98 (H) 79 - 97 fL   MCH 31.1 26.6 - 33.0 pg   MCHC 31.8 31.5 - 35.7 g/dL   RDW 78.4 69.6 - 29.5 %   Platelets 257 150 - 450 x10E3/uL   Neutrophils 69 Not Estab. %   Lymphs 21 Not Estab. %   Monocytes 6 Not Estab. %   Eos 3 Not Estab. %   Basos 1 Not Estab. %   Neutrophils Absolute 5.7 1.4 - 7.0 x10E3/uL   Lymphocytes Absolute 1.7 0.7 - 3.1 x10E3/uL   Monocytes  Absolute 0.5 0.1 - 0.9 x10E3/uL   EOS (ABSOLUTE) 0.2 0.0 - 0.4 x10E3/uL   Basophils Absolute 0.1 0.0 - 0.2 x10E3/uL   Immature Granulocytes 0 Not Estab. %   Immature Grans (Abs) 0.0 0.0 - 0.1 x10E3/uL  Allergens w/Total IgE Area 2  Result Value Ref Range   Class Description Allergens Comment    IgE (Immunoglobulin E), Serum 68 6 - 495 IU/mL   D Pteronyssinus IgE 0.25 (A) Class 0/I kU/L   D Farinae IgE 0.28 (A) Class 0/I kU/L   Cat Dander IgE 2.60 (A) Class III kU/L   Dog Dander IgE 0.43 (A) Class I kU/L   [place] Grass IgE 0.38 (A) Class I kU/L   Timothy Grass IgE 1.09 (A) Class II kU/L   Johnson Grass IgE 0.40 (A) Class I kU/L   Cockroach, German IgE <0.10 Class 0 kU/L   Penicillium Chrysogen IgE <0.10 Class 0 kU/L   Cladosporium Herbarum IgE <0.10 Class 0 kU/L   Aspergillus Fumigatus IgE <0.10 Class 0 kU/L   Alternaria Alternata IgE <0.10 Class 0 kU/L   Maple/Box Elder IgE 0.24 (A) Class 0/I kU/L   Common Silver Charletta Cousin IgE 2.29 (A) Class III kU/L   Cedar, [place] IgE <0.10 Class 0 kU/L   Oak, White IgE 2.16 (  A) Class III kU/L   Elm, American IgE 0.38 (A) Class I kU/L   C[place]wood IgE <0.10 Class 0 kU/L   Pecan, Hickory IgE 1.74 (A) Class III kU/L   White Mulberry IgE <0.10 Class 0 kU/L   Ragweed, Short IgE 3.78 (A) Class III kU/L   Pigweed, Rough IgE <0.10 Class 0 kU/L   Sheep Sorrel IgE Qn 0.14 (A) Class 0/I kU/L   Mouse Urine IgE 0.12 (A) Class 0/I kU/L      Assessment & Plan:   Problem List Items Addressed This Visit   None    Follow up plan: No follow-ups on file.

## 2023-09-24 ENCOUNTER — Other Ambulatory Visit: Payer: Self-pay | Admitting: Internal Medicine

## 2023-09-24 ENCOUNTER — Ambulatory Visit (INDEPENDENT_AMBULATORY_CARE_PROVIDER_SITE_OTHER): Payer: PRIVATE HEALTH INSURANCE | Admitting: Podiatry

## 2023-09-24 ENCOUNTER — Encounter: Payer: Self-pay | Admitting: Nurse Practitioner

## 2023-09-24 ENCOUNTER — Other Ambulatory Visit: Payer: Self-pay

## 2023-09-24 ENCOUNTER — Ambulatory Visit (INDEPENDENT_AMBULATORY_CARE_PROVIDER_SITE_OTHER): Payer: PRIVATE HEALTH INSURANCE | Admitting: *Deleted

## 2023-09-24 ENCOUNTER — Encounter: Payer: Self-pay | Admitting: Podiatry

## 2023-09-24 ENCOUNTER — Ambulatory Visit (INDEPENDENT_AMBULATORY_CARE_PROVIDER_SITE_OTHER): Payer: PRIVATE HEALTH INSURANCE | Admitting: Nurse Practitioner

## 2023-09-24 VITALS — BP 122/74 | HR 98 | Temp 97.9°F | Resp 16 | Ht 65.0 in | Wt 313.3 lb

## 2023-09-24 DIAGNOSIS — L600 Ingrowing nail: Secondary | ICD-10-CM

## 2023-09-24 DIAGNOSIS — J309 Allergic rhinitis, unspecified: Secondary | ICD-10-CM

## 2023-09-24 IMAGING — DX DG CHEST 1V PORT
1 series · 1 of 1 positions shown · non-contrast
Comparison: 12/09/2012

CLINICAL DATA: Pt presents with SOB x1 hour, per family. Pt reports
hx of asthma but has not had an exacerbation in a while. Pt examined
by RT in triage, reports scattered wheezing through out and unable
to speak in complete sentences. cough, asthma

EXAM:
PORTABLE CHEST 1 VIEW

[chest]
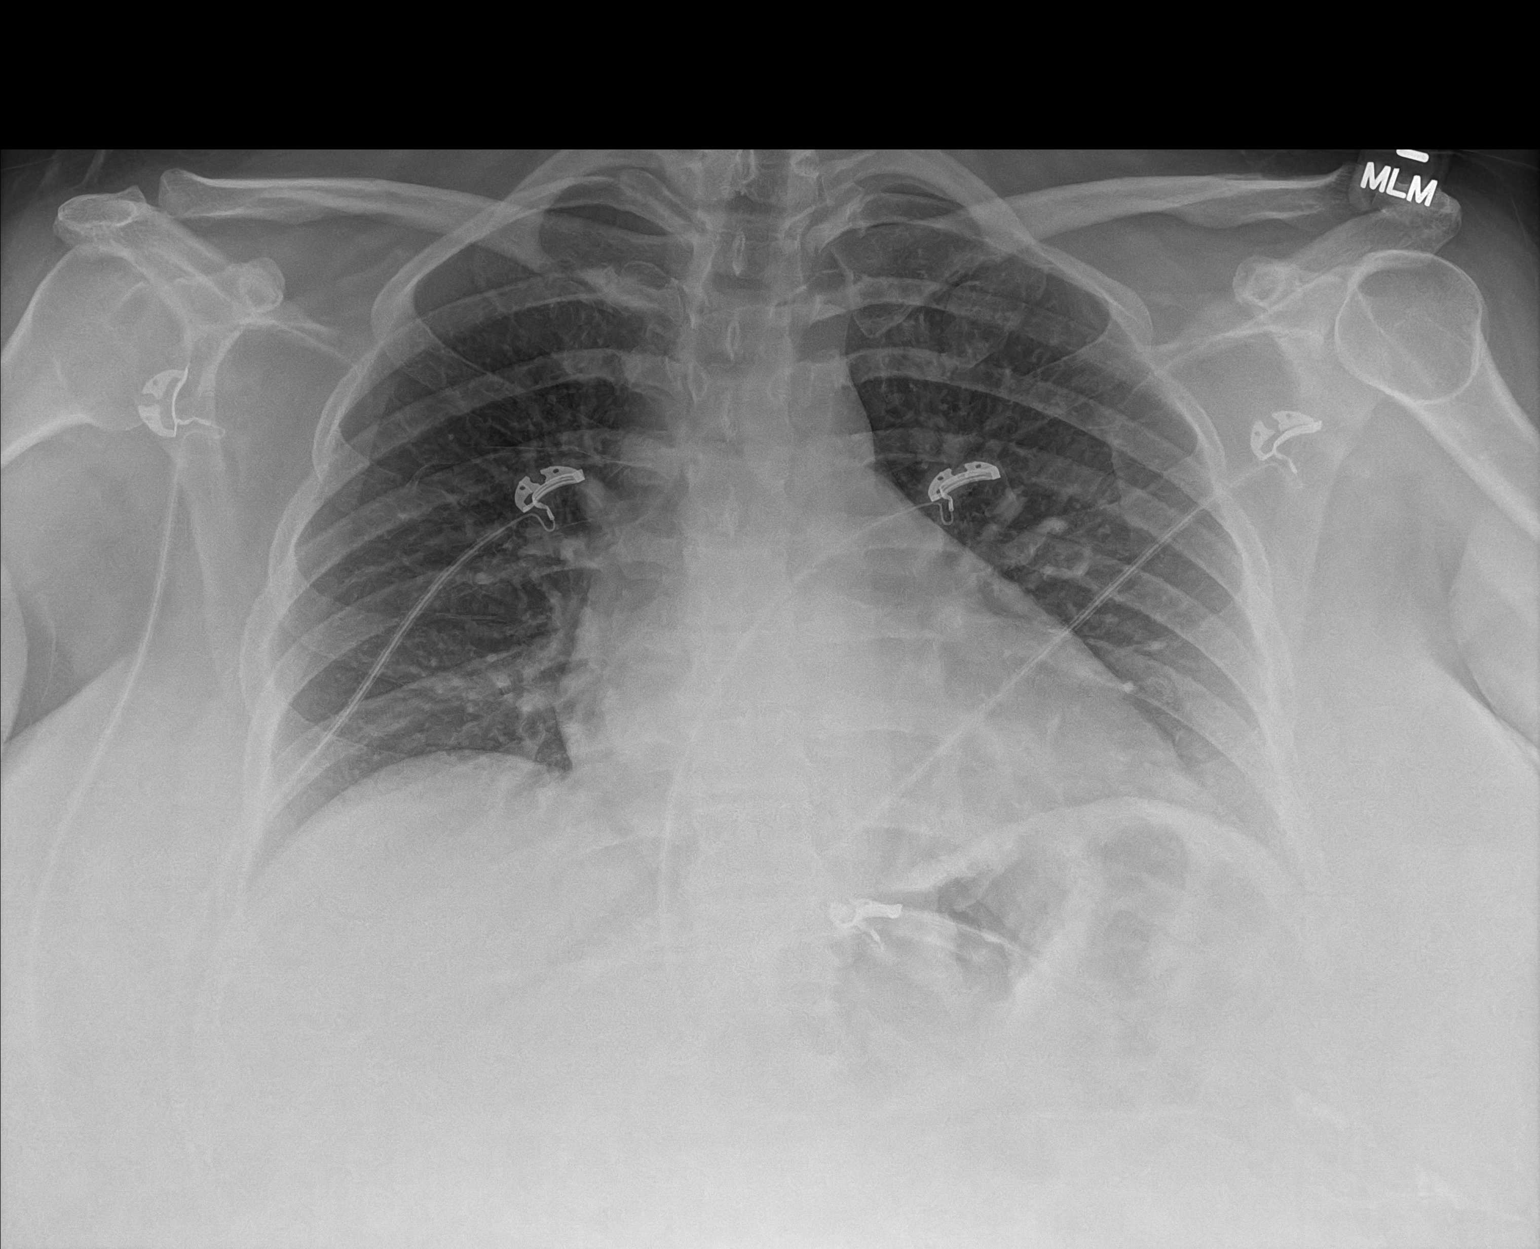

[1 of 1 positions shown; findings below may reference images not displayed]

FINDINGS: Normal cardiac silhouette. Anterior cervical few noted. Low lung
volumes. No focal consolidation. No pneumothorax. No pulmonary
edema.
IMPRESSION: Low lung volumes.  No acute findings otherwise.

## 2023-09-24 MED ORDER — EPINEPHRINE 0.3 MG/0.3ML IJ SOAJ: 0.3000 mg | INTRAMUSCULAR | 1 refills | Status: DC | PRN

## 2023-09-24 MED ORDER — DOXYCYCLINE HYCLATE 100 MG PO TABS
100.0000 mg | ORAL_TABLET | Freq: Two times a day (BID) | ORAL | 0 refills | Status: DC
Start: 2023-09-24 — End: 2023-09-24

## 2023-09-24 MED ORDER — NEOMYCIN-POLYMYXIN-HC 1 % OT SOLN: OTIC | 1 refills | Status: DC

## 2023-09-24 MED ORDER — CETIRIZINE HCL 10 MG PO TABS
10.0000 mg | ORAL_TABLET | Freq: Every day | ORAL | Status: DC
  Administered 2023-09-24: 10 mg via ORAL

## 2023-09-24 NOTE — Patient Instructions (Signed)

## 2023-09-24 NOTE — Progress Notes (Addendum)
Immunotherapy   Patient Details  Name: Tracey Perry MRN: [id number] Date of Birth: [birth year]  09/24/2023  [name] started injections for  T-G-C-D, DMITE-WEED Following schedule: B  Frequency:2 times per week Epi-Pen:Epi-Pen Available  Consent signed and patient instructions given. Patient started Allergy Injections and received 0.64mL of T-G-C-D in the RUA and 0.74mL of DMITE-WEED in the LUA. Patient waited 30 minutes and did experience one singular hive on the right lower forearm. She was given 10mg  of Zyrtec and vitals were taken R 18, O2 100, BP 112/72 Left arm, HR 69. Patient was observed after 30 minutes by Dr. Allena Katz and vitals were obtained R 18, O2 100, BP 116/80 Left Arm, HR 83. Patient was stable and left the office per Dr. Allena Katz.   [name] 09/24/2023, 2:14 PM

## 2023-09-24 NOTE — Progress Notes (Signed)
Started allergy shots today with first dose of 0.05cc of Blue vial. Did develop a small hive on R lower forearm, no hives at the site on injection or anywhere else on body.  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  Took Xyzal last night. Denies any GI, respiratory symptoms.  Vitals stable.  Observed for a total of 1 hour after the shot.  Given Zyrtec 10mg  x1.  Will plan to continue on traditional updosing schedule.  Discussed possibility of this hive being related to pressure from where her arm was resting as the hive has persisted and still no other symptoms.  Has an Epipen for future shots.

## 2023-09-24 NOTE — Progress Notes (Signed)
Subjective:  Patient ID: Tracey Perry, female    DOB: 04-23-1991,  MRN: [id number] HPI Chief Complaint  Patient presents with   Toe Pain    Hallux left - lateral border, infected x 2 weeks, PCP eval-been on bactrim and keflex, also using mupirocin cream-no better   New Patient (Initial Visit)    32 y.o. female presents with the above complaint.   ROS: Denies fever chills nausea vomit muscle aches pains calf pain back pain chest pain shortness of breath.  Past Medical History:  Diagnosis Date   Allergy    Anxiety    Depression    Gastroparesis    Herpes simplex without complication    Morbid obesity due to excess calories (HCC)    PMDD (premenstrual dysphoric disorder)    Vertigo    Vitamin D deficiency    Past Surgical History:  Procedure Laterality Date   ADENOIDECTOMY  05/2000   anterior cervical decompression  Left 12/12/2020   revision with removal of hardware and decompression  - done at [facility]   APPENDECTOMY  03/2013   CHOLECYSTECTOMY  09/2009   COLONOSCOPY WITH PROPOFOL N/A 04/30/2021   Procedure: COLONOSCOPY WITH PROPOFOL;  Surgeon: Toney Reil, MD;  Location: [facility] ENDOSCOPY;  Service: Gastroenterology;  Laterality: N/A;   TONSILLECTOMY  05/2000   TYMPANOSTOMY TUBE PLACEMENT      Current Outpatient Medications:    NEOMYCIN-POLYMYXIN-HYDROCORTISONE (CORTISPORIN) 1 % SOLN OTIC solution, Apply 1-2 drops to toe BID after soaking, Disp: 10 mL, Rfl: 1   amitriptyline (ELAVIL) 25 MG tablet, Take 25 mg by mouth at bedtime., Disp: , Rfl:    budesonide-formoterol (SYMBICORT) 160-4.5 MCG/ACT inhaler, Inhale 2 puffs into the lungs 2 (two) times daily., Disp: 30.6 g, Rfl: 1   cholecalciferol (VITAMIN D3) 25 MCG (1000 UNIT) tablet, Take 1,000 Units by mouth daily., Disp: , Rfl:    clonazePAM (KLONOPIN) 0.5 MG tablet, Take 1 tablet (0.5 mg total) by mouth 2 (two) times daily., Disp: 60 tablet, Rfl: 4   cyanocobalamin (VITAMIN B12) 250 MCG tablet, Take by mouth.,  Disp: , Rfl:    Drospirenone (SLYND) 4 MG TABS, Take 4 mg by mouth daily., Disp: , Rfl:    fluticasone (FLONASE) 50 MCG/ACT nasal spray, Place into both nostrils daily., Disp: , Rfl:    Fremanezumab-vfrm (AJOVY) 225 MG/1.5ML SOAJ, Inject 225 mg into the skin every 30 (thirty) days., Disp: 1.68 mL, Rfl: 11   ipratropium-albuterol (DUONEB) 0.5-2.5 (3) MG/3ML SOLN, Take 3 mLs by nebulization every 4 (four) hours as needed., Disp: 360 mL, Rfl: 2   lamoTRIgine (LAMICTAL) 200 MG tablet, Take 250 mg by mouth daily., Disp: , Rfl:    levocetirizine (XYZAL) 5 MG tablet, Take 1 tablet (5 mg total) by mouth daily as needed for allergies (Can take an extra dose during flare ups.)., Disp: 180 tablet, Rfl: 1   loratadine (CLARITIN) 10 MG tablet, Take 1 tablet (10 mg total) by mouth daily as needed for allergies (Can take an extra dose during flare ups.)., Disp: 180 tablet, Rfl: 1   montelukast (SINGULAIR) 10 MG tablet, Take 1 tablet (10 mg total) by mouth every evening., Disp: 90 tablet, Rfl: 1   norethindrone (AYGESTIN) 5 MG tablet, Take 1 tablet (5 mg total) by mouth daily., Disp: 28 tablet, Rfl: 0   Olopatadine HCl (PATADAY) 0.2 % SOLN, Place 1 drop into both eyes 1 day or 1 dose., Disp: 7.5 mL, Rfl: 1   Olopatadine-Mometasone (RYALTRIS) 665-25 MCG/ACT SUSP, Place 1 spray  into the nose 2 (two) times daily., Disp: 29 g, Rfl: 5   ondansetron (ZOFRAN-ODT) 8 MG disintegrating tablet, Take 1 tablet (8 mg total) by mouth every 8 (eight) hours as needed for nausea or vomiting., Disp: 20 tablet, Rfl: 6   propranolol ER (INDERAL LA) 160 MG SR capsule, Take 160 mg by mouth daily., Disp: , Rfl:    Rimegepant Sulfate (NURTEC) 75 MG TBDP, Take 1 tablet (75 mg total) by mouth as needed (take 1 tablet at onset of headache, Cabrina Shiroma is 1 tablet in 24 hours)., Disp: 8 tablet, Rfl: 11   sertraline (ZOLOFT) 100 MG tablet, Take 100 mg by mouth daily., Disp: , Rfl:    Spacer/Aero-Holding Chambers DEVI, 1 Device by Does not apply route as  directed., Disp: 1 each, Rfl: 1   tirzepatide (ZEPBOUND) 7.5 MG/0.5ML Pen, Inject 7.5 mg into the skin once a week., Disp: 2 mL, Rfl: 0   valACYclovir (VALTREX) 500 MG tablet, Take 1 tablet (500 mg total) by mouth daily. Tid during outbreaks, Disp: 100 tablet, Rfl: 1  Allergies  Allergen Reactions   Amoxicillin-Pot Clavulanate Other (See Comments)    Mood swings Mood swings Mood swings   Apple Itching and Swelling    ONLY certain "FRESH FRUITS" cause throat itches & in spring, throat feels like it will swell.  (apples, peaches, pears, plums, mango)   Prednisone Palpitations and Other (See Comments)    Severe mood swings and tachycardia   Tape Itching   Review of Systems Objective:  There were no vitals filed for this visit.  General: Well developed, nourished, in no acute distress, alert and oriented x3   Dermatological: Skin is warm, dry and supple bilateral. Nails x 10 are well maintained; remaining integument appears unremarkable at this time. There are no open sores, no preulcerative lesions, no rash or signs of infection present.  Sharp incurvated fibular border of the hallux left with gross granulation tissue to the distal lateral aspect.  Mild cellulitis extending to the level of the base of the nail and just proximal to that.  No purulence no malodor  Vascular: Dorsalis Pedis artery and Posterior Tibial artery pedal pulses are 2/4 bilateral with immedate capillary fill time. Pedal hair growth present. No varicosities and no lower extremity edema present bilateral.   Neruologic: Grossly intact via light touch bilateral. Vibratory intact via tuning fork bilateral. Protective threshold with Semmes Wienstein monofilament intact to all pedal sites bilateral. Patellar and Achilles deep tendon reflexes 2+ bilateral. No Babinski or clonus noted bilateral.   Musculoskeletal: No gross boney pedal deformities bilateral. No pain, crepitus, or limitation noted with foot and ankle range of  motion bilateral. Muscular strength 5/5 in all groups tested bilateral.  Gait: Unassisted, Nonantalgic.    Radiographs:  None taken  Assessment & Plan:   Assessment: Ingrown toenail fibular border hallux left  Plan: Sharp incurvated nail margin was removed today with a chemical matricectomy to the fibular border.  Tolerated procedure well after local anesthetic was administered.  Was given both oral and written home-going instruction of the care and soaking of the toe as well as a prescription for Cortisporin Otic to be applied twice daily after soaking.  She will follow-up with [place] in a couple of weeks to make sure she is doing well.     Whitleigh Garramone T. Hopkins, [place]

## 2023-09-24 NOTE — Addendum Note (Signed)
Addended by: Dollene Cleveland R on: 09/24/2023 03:26 PM   Modules accepted: Orders

## 2023-09-25 ENCOUNTER — Encounter: Payer: Self-pay | Admitting: Podiatry

## 2023-09-30 ENCOUNTER — Other Ambulatory Visit: Payer: Self-pay | Admitting: Family Medicine

## 2023-10-03 ENCOUNTER — Ambulatory Visit (INDEPENDENT_AMBULATORY_CARE_PROVIDER_SITE_OTHER): Payer: PRIVATE HEALTH INSURANCE | Admitting: *Deleted

## 2023-10-03 DIAGNOSIS — J309 Allergic rhinitis, unspecified: Secondary | ICD-10-CM

## 2023-10-07 ENCOUNTER — Encounter: Payer: Self-pay | Admitting: Podiatry

## 2023-10-07 ENCOUNTER — Ambulatory Visit: Payer: PRIVATE HEALTH INSURANCE | Admitting: Podiatry

## 2023-10-07 DIAGNOSIS — L600 Ingrowing nail: Secondary | ICD-10-CM

## 2023-10-07 NOTE — Progress Notes (Signed)
Chief Complaint  Patient presents with   Ingrown Toenail    Nail check - hallux left   "Its a little sore, still soaking at times"    HPI: 31 y.o. female presents today status post recent left hallux fibular border partial nail avulsion with chemical matrixectomy 11/6 with Dr. Al Corpus.  She has been doing very well.  She does has been using the neomycin drops as directed and has been soaking the toe fairly regularly at this point.  She does endorse some occasional oozing at this point and some tenderness with shoes.  Patient denies any nausea, vomiting, fever, chills, chest pain, shortness of breath  Past Medical History:  Diagnosis Date   Allergy    Anxiety    Depression    Gastroparesis    Herpes simplex without complication    Morbid obesity due to excess calories (HCC)    PMDD (premenstrual dysphoric disorder)    Vertigo    Vitamin D deficiency     Past Surgical History:  Procedure Laterality Date   ADENOIDECTOMY  05/2000   anterior cervical decompression  Left 12/12/2020   revision with removal of hardware and decompression  - done at [facility]   APPENDECTOMY  03/2013   CHOLECYSTECTOMY  09/2009   COLONOSCOPY WITH PROPOFOL N/A 04/30/2021   Procedure: COLONOSCOPY WITH PROPOFOL;  Surgeon: Toney Reil, MD;  Location: [facility] ENDOSCOPY;  Service: Gastroenterology;  Laterality: N/A;   TONSILLECTOMY  05/2000   TYMPANOSTOMY TUBE PLACEMENT      Allergies  Allergen Reactions   Amoxicillin-Pot Clavulanate Other (See Comments)    Mood swings Mood swings Mood swings   Apple Itching and Swelling    ONLY certain "FRESH FRUITS" cause throat itches & in spring, throat feels like it will swell.  (apples, peaches, pears, plums, mango)   Prednisone Palpitations and Other (See Comments)    Severe mood swings and tachycardia   Tape Itching    ROS -negative except as stated in HPI   Physical Exam: There were no vitals filed for this visit.  General: The patient is alert and  oriented x3 in no acute distress.  Dermatology: Skin is warm, dry and supple bilateral lower extremities. Interspaces are clear of maceration and debris.  Fibular border of left hallux nail healing well. No drainage today. Mild localized redness.  No focal warmth increased  Vascular: Palpable pedal pulses bilaterally. Capillary refill within normal limits.  No appreciable edema.  Neurological: Light touch sensation grossly intact bilateral feet.   Musculoskeletal Exam: No pedal deformities noted.  Muscle strength 5/5 for all pedal groups.   Assessment/Plan of Care: 1. Ingrown left big toenail      No orders of the defined types were placed in this encounter.  None  Discussed clinical findings with patient today.  # Status post chemical matrixectomy left hallux fibular border 09/24/2023 -Patient healing well at this point.  Site dressed with antibacterial ointment and Band-Aid -Encouraged continued foot soaks in warm Epsom salts twice a day for 20 minutes, she may need to do this for another week or -She may continue using her drops and applying Band-Aid while she notices some drainage.  Expect she may need to do this for another week or so. -Patient may follow-up as needed.  Contact office if symptoms recur or signs of infection develop   Sherrel Ploch L. Marchia Bond, AACFAS [facility]     [address]  [place], [place] [address]                Office [phone]  Fax [phone]

## 2023-10-07 NOTE — Patient Instructions (Signed)

## 2023-10-09 ENCOUNTER — Ambulatory Visit (INDEPENDENT_AMBULATORY_CARE_PROVIDER_SITE_OTHER): Payer: PRIVATE HEALTH INSURANCE

## 2023-10-09 DIAGNOSIS — J309 Allergic rhinitis, unspecified: Secondary | ICD-10-CM | POA: Diagnosis not present

## 2023-10-14 ENCOUNTER — Ambulatory Visit (INDEPENDENT_AMBULATORY_CARE_PROVIDER_SITE_OTHER): Payer: PRIVATE HEALTH INSURANCE

## 2023-10-14 DIAGNOSIS — J309 Allergic rhinitis, unspecified: Secondary | ICD-10-CM | POA: Diagnosis not present

## 2023-10-20 ENCOUNTER — Encounter: Payer: Self-pay | Admitting: Physician Assistant

## 2023-10-20 ENCOUNTER — Ambulatory Visit (INDEPENDENT_AMBULATORY_CARE_PROVIDER_SITE_OTHER): Payer: PRIVATE HEALTH INSURANCE | Admitting: Physician Assistant

## 2023-10-20 VITALS — BP 122/70 | HR 96 | Resp 16 | Ht 65.0 in | Wt 304.0 lb

## 2023-10-20 DIAGNOSIS — G43011 Migraine without aura, intractable, with status migrainosus: Secondary | ICD-10-CM | POA: Diagnosis not present

## 2023-10-20 MED ORDER — KETOROLAC TROMETHAMINE 60 MG/2ML IM SOLN
60.0000 mg | Freq: Once | INTRAMUSCULAR | Status: AC
Start: 2023-10-20 — End: 2023-10-20
  Administered 2023-10-20: 60 mg via INTRAMUSCULAR

## 2023-10-20 NOTE — Progress Notes (Signed)
Acute Office Visit   Patient: Tracey Perry   DOB: May 03, 1991   32 y.o. Female  MRN: [id number] Visit Date: 10/20/2023  Today's healthcare provider: Oswaldo Conroy Ivet Guerrieri, PA-C  Introduced myself to the patient as a Secondary school teacher and provided education on APPs in clinical practice.    Chief Complaint  Patient presents with   Migraine    X3 days, Nurtec helps at night only   Subjective    HPI HPI     Migraine    Additional comments: X3 days, Nurtec helps at night only      Last edited by Dollene Primrose, CMA on 10/20/2023  2:31 PM.      MIGRAINES Duration: chronic Onset: sudden onset with gradual build in intensity  Severity: 7/10 Quality: sharp and aching Location: hurting along right temple and under eyes  Headache duration:current headache has been present for 3 days  Radiation: no Time of day headache occurs: recurs in the AM  Alleviating factors: Nurtec - she tried Eletriptan when headache first started but it did not provide relief  Aggravating factors: thinks the recent cold front that came over the weekend triggered it  Headache status at time of visit: current headache Treatments attempted: She has been trying Nurtec at night but this does not provide lasting relief and headache comes back the next morning    She has been using Zofran for nausea and reports relief with this but still has reduced appetite  Aura: no Nausea:  yes Vomiting: no Photophobia:  yes Phonophobia:  yes- sound is bother her more than light  Effect on social functioning:  yes Numbers of missed days of school/work each month:  Confusion:  no Gait disturbance/ataxia:  no-reports some lightheadedness but no gait changes  Behavioral changes:  no Fevers:  no   She was seen by Neurology on 10/09/23- reviewed visit notes She has not had Emgality- waiting on PA  Has Baclofen and they want her to start Eletriptan 40 mg for abortive therapy She also has Nurtec for abortive therapy too   She  would like Phenergan injection along with Toradol- she drove herself and states the last time she had phenergan shot it was 2 hours before it started to work - she has plans to go straight home after apt - driving about 10 minutes away from clinic   Medications: Outpatient Medications Prior to Visit  Medication Sig   amitriptyline (ELAVIL) 25 MG tablet Take 25 mg by mouth at bedtime.   budesonide-formoterol (SYMBICORT) 160-4.5 MCG/ACT inhaler Inhale 2 puffs into the lungs 2 (two) times daily.   cholecalciferol (VITAMIN D3) 25 MCG (1000 UNIT) tablet Take 1,000 Units by mouth daily.   clonazePAM (KLONOPIN) 0.5 MG tablet Take 1 tablet (0.5 mg total) by mouth 2 (two) times daily.   cyanocobalamin (VITAMIN B12) 250 MCG tablet Take by mouth.   Drospirenone (SLYND) 4 MG TABS Take 4 mg by mouth daily.   EPINEPHrine (EPIPEN 2-PAK) 0.3 mg/0.3 mL IJ SOAJ injection Inject 0.3 mg into the muscle as needed for anaphylaxis.   fluticasone (FLONASE) 50 MCG/ACT nasal spray Place into both nostrils daily.   Fremanezumab-vfrm (AJOVY) 225 MG/1.5ML SOAJ Inject 225 mg into the skin every 30 (thirty) days.   ipratropium-albuterol (DUONEB) 0.5-2.5 (3) MG/3ML SOLN Take 3 mLs by nebulization every 4 (four) hours as needed.   lamoTRIgine (LAMICTAL) 200 MG tablet Take 250 mg by mouth daily.   levocetirizine (XYZAL) 5 MG tablet  Take 1 tablet (5 mg total) by mouth daily as needed for allergies (Can take an extra dose during flare ups.).   loratadine (CLARITIN) 10 MG tablet Take 1 tablet (10 mg total) by mouth daily as needed for allergies (Can take an extra dose during flare ups.).   montelukast (SINGULAIR) 10 MG tablet Take 1 tablet (10 mg total) by mouth every evening.   NEOMYCIN-POLYMYXIN-HYDROCORTISONE (CORTISPORIN) 1 % SOLN OTIC solution Apply 1-2 drops to toe BID after soaking   norethindrone (AYGESTIN) 5 MG tablet Take 1 tablet (5 mg total) by mouth daily.   Olopatadine HCl (PATADAY) 0.2 % SOLN Place 1 drop into both  eyes 1 day or 1 dose.   Olopatadine-Mometasone (RYALTRIS) X543819 MCG/ACT SUSP Place 1 spray into the nose 2 (two) times daily.   ondansetron (ZOFRAN-ODT) 8 MG disintegrating tablet Take 1 tablet (8 mg total) by mouth every 8 (eight) hours as needed for nausea or vomiting.   propranolol ER (INDERAL LA) 160 MG SR capsule Take 160 mg by mouth daily.   Rimegepant Sulfate (NURTEC) 75 MG TBDP Take 1 tablet (75 mg total) by mouth as needed (take 1 tablet at onset of headache, max is 1 tablet in 24 hours).   sertraline (ZOLOFT) 100 MG tablet Take 100 mg by mouth daily.   Spacer/Aero-Holding Chambers DEVI 1 Device by Does not apply route as directed.   valACYclovir (VALTREX) 500 MG tablet Take 1 tablet (500 mg total) by mouth daily. Tid during outbreaks   ZEPBOUND 7.5 MG/0.5ML Pen Inject the contents of one pen (7.5 mg total) under the skin once a week.   Facility-Administered Medications Prior to Visit  Medication Dose Route Frequency Provider   cetirizine (ZYRTEC) tablet 10 mg  10 mg Oral Daily Birder Robson, MD    Review of Systems  Gastrointestinal:  Positive for nausea. Negative for diarrhea and vomiting.  Neurological:  Positive for headaches. Negative for dizziness, light-headedness and numbness.        Objective    BP 122/70   Pulse 96   Resp 16   Ht 5\' 5"  (1.651 m)   Wt (!) 304 lb (137.9 kg)   SpO2 95%   BMI 50.59 kg/m     Physical Exam Vitals reviewed.  Constitutional:      General: She is awake.     Appearance: Normal appearance. She is well-developed and well-groomed.     Comments: Patient wearing sunglasses during apt due to photophobia   HENT:     Head: Normocephalic and atraumatic.  Pulmonary:     Effort: Pulmonary effort is normal.  Musculoskeletal:     Cervical back: Normal range of motion and neck supple.  Neurological:     General: No focal deficit present.     Mental Status: She is alert and oriented to person, place, and time.     GCS: GCS eye subscore  is 4. GCS verbal subscore is 5. GCS motor subscore is 6.     Cranial Nerves: No cranial nerve deficit, dysarthria or facial asymmetry.     Motor: No tremor.  Psychiatric:        Attention and Perception: Attention and perception normal.        Mood and Affect: Mood and affect normal.        Speech: Speech normal.        Behavior: Behavior normal. Behavior is cooperative.        Thought Content: Thought content normal.  Judgment: Judgment normal.       No results found for any visits on 10/20/23.  Assessment & Plan      No follow-ups on file.      Problem List Items Addressed This Visit   None Visit Diagnoses     Intractable migraine without aura and with status migrainosus    -  Primary   Relevant Medications   ketorolac (TORADOL) injection 60 mg (Completed)      Patient has hx of chronic migraines  Acute exacerbation  Patient reports intractable migraine that is not responding to home measures comprised of repeat Nurtec dosing, Eletriptan She is not currently on a preventative medication as she is waiting on prior authorization for Emgality. She reports increased sensitivity to light and sound, ongoing right temple pain that has been present for the past 3 days. Will provide Toradol 60 mg injection to assist with pain management.  Recommend that she continues with Nurtec per directions, Zofran as needed for nausea If symptoms are not resolving with these measures recommend that she calls her neurologist or seeks assistance at the ED Follow-up as needed for progressing or persistent symptoms  No follow-ups on file.   I, Toshua Honsinger E Dartagnan Beavers, PA-C, have reviewed all documentation for this visit. The documentation on 10/20/23 for the exam, diagnosis, procedures, and orders are all accurate and complete.   Jacquelin Hawking, MHS, PA-C [facility]

## 2023-10-21 ENCOUNTER — Ambulatory Visit (INDEPENDENT_AMBULATORY_CARE_PROVIDER_SITE_OTHER): Payer: PRIVATE HEALTH INSURANCE | Admitting: *Deleted

## 2023-10-21 DIAGNOSIS — J309 Allergic rhinitis, unspecified: Secondary | ICD-10-CM

## 2023-10-23 ENCOUNTER — Ambulatory Visit (INDEPENDENT_AMBULATORY_CARE_PROVIDER_SITE_OTHER): Payer: PRIVATE HEALTH INSURANCE | Admitting: *Deleted

## 2023-10-23 DIAGNOSIS — J309 Allergic rhinitis, unspecified: Secondary | ICD-10-CM

## 2023-10-24 ENCOUNTER — Other Ambulatory Visit: Payer: Self-pay | Admitting: Family Medicine

## 2023-10-24 DIAGNOSIS — J4530 Mild persistent asthma, uncomplicated: Secondary | ICD-10-CM

## 2023-10-24 NOTE — Progress Notes (Signed)
Name: Tracey Perry   MRN: [id number]    DOB: Oct 24, 1991   Date:11/06/2023       Progress Note  Subjective  Chief Complaint  Chief Complaint  Patient presents with   Medical Management of Chronic Issues    HPI  Discussed the use of AI scribe software for clinical note transcription with the patient, who gave verbal consent to proceed.  History of Present Illness   The patient, with a history of vestibular migraines, bipolar disorder, and asthma, presents with ongoing issues related to migraines. She reports experiencing various types of headaches, including tension and stabbing headaches, in addition to the vestibular migraines. The frequency of these migraines is approximately four times a month, which is consistent with her previous pattern. These episodes are debilitating, often causing the patient to miss work unless they occur over the weekend. The patient describes severe symptoms during these episodes, including light and sound sensitivity, and vomiting and sometimes vertigo.  The patient has previously undergone physical therapy for the tension headaches, but reported minimal benefit. She has also been under the care of a neurologist. Her current migraine medication regimen includes amitriptyline, Emgality, propranolol, and indomethacin The patient also takes Pepcid, prescribed by her neurologist, likely due to the risk of gastric bleeding associated with chronic indomethacin use.  In addition to migraines, the patient has been diagnosed with bipolar disorder, primarily manifesting as depressive episodes. She is under the care of a psychiatrist and is currently taking Lamictal, sertraline, and recently started Ritalin for newly diagnosed ADHD. The patient reports significant improvement in daytime somnolence and overall functionality since starting Ritalin.  The patient's asthma is managed with Symbicort, Xyzal, loratadine, and montelukast. She is also receiving allergy shots. The  patient has obstructive sleep apnea and uses a CPAP machine nightly.  The patient has been on a weight loss journey, initially losing weight with Wegovy, then switching to Zepbound. However, recent weight gain was noted, which the patient attributes to differences in clothing and recent food and fluid intake at the time of weighing. The patient has just started a higher dose of Zepbound last week   Lastly, the patient is on hormonal therapy with Slynd for menstrual suppression due to a family history of blood clots ( cannot take estrogen). She is considering the addition of a copper IUD for contraceptive purposes.         Patient Active Problem List   Diagnosis Date Noted   Intractable migraine with aura with status migrainosus 12/13/2022   B12 deficiency 11/26/2022   Moderate persistent allergic asthma without complication 06/07/2022   Morbid obesity with BMI of 50.0-59.9, adult (HCC) 05/08/2022   Episodic tension-type headache, not intractable 04/25/2021   Idiopathic stabbing headache 04/25/2021   Anxiety 12/29/2020   Cervical spondylosis with myelopathy and radiculopathy 12/11/2020   Cervical stenosis of spinal canal 12/11/2020   Chronic pain 12/06/2020   Cervical spinal stenosis 05/05/2020   Bulging of cervical intervertebral disc 05/05/2020   Cervical radiculopathy 05/05/2020   Bipolar 2 disorder (HCC) 12/15/2018   Fever blister 07/29/2016   Menorrhagia with irregular cycle 06/20/2016   OSA on CPAP 05/11/2016   Hypersomnia 12/25/2015   Cluster B personality disorder (HCC) 10/27/2015   H/O urinary tract infection 06/21/2015   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   Depression, major, recurrent, moderate (HCC) 06/21/2015   Gastroparesis 06/21/2015   Hx of cold sores 06/21/2015   Generalized anxiety disorder 05/31/2015   History of pyelonephritis 05/30/2015   Dysmenorrhea 05/30/2015  History of asthma 05/30/2015   Migraine without aura and responsive to treatment 05/30/2015    Allergic rhinitis, seasonal 05/30/2015   Vertigo 05/30/2015   IBS (irritable bowel syndrome) 09/20/2013   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   H/O cervical spine surgery 12/29/2012   Vitamin D deficiency 11/23/2009   History of mononucleosis 04/03/2009    Past Surgical History:  Procedure Laterality Date   ADENOIDECTOMY  05/2000   anterior cervical decompression  Left 12/12/2020   revision with removal of hardware and decompression  - done at [facility]   APPENDECTOMY  03/2013   CHOLECYSTECTOMY  09/2009   COLONOSCOPY WITH PROPOFOL N/A 04/30/2021   Procedure: COLONOSCOPY WITH PROPOFOL;  Surgeon: Toney Reil, MD;  Location: [facility] ENDOSCOPY;  Service: Gastroenterology;  Laterality: N/A;   TONSILLECTOMY  05/2000   TYMPANOSTOMY TUBE PLACEMENT      Family History  Problem Relation Age of Onset   Urticaria Mother    Depression Mother    Hypertension Mother        controlled   Allergic rhinitis Mother    Anxiety disorder Mother    Diabetes Father        controlled   Hypertension Father    Allergic rhinitis Father    Depression Father    Depression Brother    Anxiety disorder Brother    Allergic rhinitis Maternal Grandmother     Social History   Tobacco Use   Smoking status: Never   Smokeless tobacco: Never  Substance Use Topics   Alcohol use: Not Currently    Alcohol/week: 0.0 standard drinks of alcohol     Current Outpatient Medications:    amitriptyline (ELAVIL) 25 MG tablet, Take 25 mg by mouth at bedtime. neurologist, Disp: , Rfl:    budesonide-formoterol (SYMBICORT) 160-4.5 MCG/ACT inhaler, Inhale 2 puffs into the lungs 2 (two) times daily., Disp: 30.6 g, Rfl: 1   cholecalciferol (VITAMIN D3) 25 MCG (1000 UNIT) tablet, Take 1,000 Units by mouth daily., Disp: , Rfl:    clonazePAM (KLONOPIN) 0.5 MG tablet, Take 1 tablet (0.5 mg total) by mouth 2 (two) times daily., Disp: 60 tablet, Rfl: 4   cyanocobalamin (VITAMIN B12) 250 MCG tablet, Take by mouth., Disp: , Rfl:     Drospirenone (SLYND) 4 MG TABS, Take 4 mg by mouth daily., Disp: , Rfl:    EPINEPHrine (EPIPEN 2-PAK) 0.3 mg/0.3 mL IJ SOAJ injection, Inject 0.3 mg into the muscle as needed for anaphylaxis., Disp: 0.3 mL, Rfl: 1   famotidine (PEPCID) 40 MG tablet, Take 40 mg by mouth daily., Disp: , Rfl:    fluticasone (FLONASE) 50 MCG/ACT nasal spray, Place into both nostrils daily., Disp: , Rfl:    Galcanezumab-gnlm (EMGALITY) 120 MG/ML SOAJ, Inject 1 each into the skin every 30 (thirty) days., Disp: , Rfl:    indomethacin (INDOCIN SR) 75 MG CR capsule, Take 75 mg by mouth 2 (two) times daily with a meal., Disp: , Rfl:    ipratropium-albuterol (DUONEB) 0.5-2.5 (3) MG/3ML SOLN, Take 3 mLs by nebulization every 4 (four) hours as needed., Disp: 360 mL, Rfl: 2   lamoTRIgine (LAMICTAL) 200 MG tablet, Take 250 mg by mouth daily., Disp: , Rfl:    levocetirizine (XYZAL) 5 MG tablet, Take 1 tablet (5 mg total) by mouth daily as needed for allergies (Can take an extra dose during flare ups.)., Disp: 180 tablet, Rfl: 1   loratadine (CLARITIN) 10 MG tablet, Take 1 tablet (10 mg total) by mouth daily as needed for allergies (Can  take an extra dose during flare ups.)., Disp: 180 tablet, Rfl: 1   methylphenidate (RITALIN) 10 MG tablet, Take 10 mg by mouth 2 (two) times daily. Psychiatrist, Disp: , Rfl:    montelukast (SINGULAIR) 10 MG tablet, Take 1 tablet (10 mg total) by mouth every evening., Disp: 90 tablet, Rfl: 1   norethindrone (AYGESTIN) 5 MG tablet, Take 1 tablet (5 mg total) by mouth daily., Disp: 28 tablet, Rfl: 0   Olopatadine HCl (PATADAY) 0.2 % SOLN, Place 1 drop into both eyes 1 day or 1 dose., Disp: 7.5 mL, Rfl: 1   Olopatadine-Mometasone (RYALTRIS) 665-25 MCG/ACT SUSP, Place 1 spray into the nose 2 (two) times daily., Disp: 29 g, Rfl: 5   ondansetron (ZOFRAN-ODT) 8 MG disintegrating tablet, Take 1 tablet (8 mg total) by mouth every 8 (eight) hours as needed for nausea or vomiting., Disp: 20 tablet, Rfl: 6    propranolol ER (INDERAL LA) 160 MG SR capsule, Take 160 mg by mouth daily., Disp: , Rfl:    Rimegepant Sulfate (NURTEC) 75 MG TBDP, Take 1 tablet (75 mg total) by mouth as needed (take 1 tablet at onset of headache, max is 1 tablet in 24 hours)., Disp: 8 tablet, Rfl: 11   sertraline (ZOLOFT) 100 MG tablet, Take 100 mg by mouth daily., Disp: , Rfl:    Spacer/Aero-Holding Chambers DEVI, 1 Device by Does not apply route as directed., Disp: 1 each, Rfl: 1   tirzepatide (ZEPBOUND) 10 MG/0.5ML Pen, Inject 10 mg into the skin once a week., Disp: 2 mL, Rfl: 0   valACYclovir (VALTREX) 500 MG tablet, Take 1 tablet (500 mg total) by mouth daily. **TAKE 1 TABLET BY MOUTH 3 times daily during outbreaks ONLY **, Disp: 100 tablet, Rfl: 1  Allergies  Allergen Reactions   Amoxicillin-Pot Clavulanate Other (See Comments)    Mood swings Mood swings Mood swings   Apple Itching and Swelling    ONLY certain "FRESH FRUITS" cause throat itches & in spring, throat feels like it will swell.  (apples, peaches, pears, plums, mango)   Prednisone Palpitations and Other (See Comments)    Severe mood swings and tachycardia   Tape Itching    I personally reviewed active problem list, medication list, allergies, family history with the patient/caregiver today.   ROS  Ten systems reviewed and is negative except as mentioned in HPI    Objective  Vitals:   11/06/23 0840 11/06/23 0842  BP:  122/84  Pulse:  (!) 109  Resp: 16 18  Temp:  97.7 F (36.5 C)  SpO2:  99%  Weight:  (!) 312 lb 11.2 oz (141.8 kg)  Height: 5\' 5"  (1.651 m) 5\' 5"  (1.651 m)    Body mass index is 52.04 kg/m.  Physical Exam  Constitutional: Patient appears well-developed and well-nourished. Obese No distress.  HEENT: head atraumatic, normocephalic, pupils equal and reactive to light, , neck supple, throat within normal limits Cardiovascular: Normal rate, regular rhythm and normal heart sounds.  No murmur heard. No BLE  edema. Pulmonary/Chest: Effort normal and breath sounds normal. No respiratory distress. Abdominal: Soft.  There is no tenderness. Psychiatric: Patient has a normal mood and affect. behavior is normal. Judgment and thought content normal.   Recent Results (from the past 2160 hours)  CBC with Differential     Status: Abnormal   Collection Time: 08/26/23 11:03 AM  Result Value Ref Range   WBC 8.1 3.4 - 10.8 x10E3/uL   RBC 4.11 3.77 - 5.28 x10E6/uL  Hemoglobin 12.8 11.1 - 15.9 g/dL   Hematocrit 40.9 81.1 - 46.6 %   MCV 98 (H) 79 - 97 fL   MCH 31.1 26.6 - 33.0 pg   MCHC 31.8 31.5 - 35.7 g/dL   RDW 91.4 78.2 - 95.6 %   Platelets 257 150 - 450 x10E3/uL   Neutrophils 69 Not Estab. %   Lymphs 21 Not Estab. %   Monocytes 6 Not Estab. %   Eos 3 Not Estab. %   Basos 1 Not Estab. %   Neutrophils Absolute 5.7 1.4 - 7.0 x10E3/uL   Lymphocytes Absolute 1.7 0.7 - 3.1 x10E3/uL   Monocytes Absolute 0.5 0.1 - 0.9 x10E3/uL   EOS (ABSOLUTE) 0.2 0.0 - 0.4 x10E3/uL   Basophils Absolute 0.1 0.0 - 0.2 x10E3/uL   Immature Granulocytes 0 Not Estab. %   Immature Grans (Abs) 0.0 0.0 - 0.1 x10E3/uL  Allergens w/Total IgE Area 2     Status: Abnormal   Collection Time: 08/26/23 11:03 AM  Result Value Ref Range   Class Description Allergens Comment     Comment:     Levels of Specific IgE       Class  Description of Class     ---------------------------  -----  --------------------                    < 0.10         0         Negative            0.10 -    0.31         0/I       Equivocal/Low            0.32 -    0.55         I         Low            0.56 -    1.40         II        Moderate            1.41 -    3.90         III       High            3.91 -   19.00         IV        Very High           19.01 -  100.00         V         Very High                   >100.00         VI        Very High    IgE (Immunoglobulin E), Serum 68 6 - 495 IU/mL   D Pteronyssinus IgE 0.25 (A) Class 0/I kU/L   D Farinae  IgE 0.28 (A) Class 0/I kU/L   Cat Dander IgE 2.60 (A) Class III kU/L   Dog Dander IgE 0.43 (A) Class I kU/L   [place] Grass IgE 0.38 (A) Class I kU/L   Timothy Grass IgE 1.09 (A) Class II kU/L   Johnson Grass IgE 0.40 (A) Class I kU/L   Cockroach, German IgE <0.10 Class 0 kU/L   Penicillium Chrysogen IgE <0.10 Class 0 kU/L   Cladosporium Herbarum IgE <  0.10 Class 0 kU/L   Aspergillus Fumigatus IgE <0.10 Class 0 kU/L   Alternaria Alternata IgE <0.10 Class 0 kU/L   Maple/Box Elder IgE 0.24 (A) Class 0/I kU/L   Common Silver Charletta Cousin IgE 2.29 (A) Class III kU/L   Cedar, [place] IgE <0.10 Class 0 kU/L   Oak, White IgE 2.16 (A) Class III kU/L   Elm, American IgE 0.38 (A) Class I kU/L   C[place]wood IgE <0.10 Class 0 kU/L   Pecan, Hickory IgE 1.74 (A) Class III kU/L   White Mulberry IgE <0.10 Class 0 kU/L   Ragweed, Short IgE 3.78 (A) Class III kU/L   Pigweed, Rough IgE <0.10 Class 0 kU/L   Sheep Sorrel IgE Qn 0.14 (A) Class 0/I kU/L   Mouse Urine IgE 0.12 (A) Class 0/I kU/L     PHQ2/9:    09/24/2023    9:42 AM 08/04/2023    8:12 AM 04/01/2023    9:10 AM 03/24/2023    7:43 AM 12/11/2022   11:27 AM  Depression screen PHQ 2/9  Decreased Interest 1 1 1 1  0  Down, Depressed, Hopeless 1 1 1  0 0  PHQ - 2 Score 2 2 2 1  0  Altered sleeping 1 3 3 3 1   Tired, decreased energy 1 3 3 3 1   Change in appetite 1 1 0 0 0  Feeling bad or failure about yourself  1 0 0 0 0  Trouble concentrating 1 1 1  0 0  Moving slowly or fidgety/restless 1 0 0 0 0  Suicidal thoughts 0 0 0 0 0  PHQ-9 Score 8 10 9 7 2   Difficult doing work/chores Not difficult at all Somewhat difficult   Not difficult at all    phq 9 is positive   Fall Risk:    10/20/2023    2:27 PM 09/24/2023    9:42 AM 08/04/2023    8:12 AM 04/01/2023    9:03 AM 03/24/2023    7:43 AM  Fall Risk   Falls in the past year? 0 0 1 0 0  Number falls in past yr: 0 0 0 0 0  Injury with Fall? 0 0 0 0 0  Risk for fall due to : No Fall Risks No Fall  Risks History of fall(s) No Fall Risks No Fall Risks  Follow up Falls prevention discussed Falls prevention discussed Falls prevention discussed;Education provided;Falls evaluation completed Falls prevention discussed Falls prevention discussed     Assessment & Plan  Assessment and Plan    Migraine Multiple types including vestibular and tension headaches. Frequency of approximately 4 times per month, severe enough to interfere with work. Current regimen includes Amitriptyline 25mg , Emgality monthly, Propranolol 160mg , and Indomethacin 75mg  BID. -Continue current regimen. -Consider potential stomach issues with chronic Indomethacin use.  Bipolar Disorder Managed by a psychiatrist. Current regimen includes Lamictal, Sertraline, and recently added Ritalin 10mg  for newly diagnosed ADHD. -Continue current regimen. -Consider increasing Ritalin to 12.5mg  if tolerated after 3 weeks.  Obstructive Sleep Apnea Using CPAP nightly. -Continue CPAP use.  Weight Management Initial weight loss with Wegovy, switched to Zepbound. Recent increase in weight, but potential confounding factors. Recently increased Zepbound to 10mg . -Increase Zepbound to 12.5mg  for next prescription, but remain on 10mg  if still feeling full after 3 weeks. -Check weight at home and monitor changes.  Asthma/Allergies Managed with Symbicort, Xyzal, Loratadine, Montelukast, and allergy shots. -Continue current regimen.  Contraception Currently on Slynd for menstrual suppression, considering copper IUD for contraception. -Can  continue Slynd or Norethindrone with copper IUD if desired.  Follow-up in 3.5-4 months.

## 2023-10-25 ENCOUNTER — Encounter: Payer: Self-pay | Admitting: Family Medicine

## 2023-10-27 ENCOUNTER — Other Ambulatory Visit: Payer: Self-pay | Admitting: Family Medicine

## 2023-10-27 MED ORDER — ZEPBOUND 10 MG/0.5ML ~~LOC~~ SOAJ: 10.0000 mg | SUBCUTANEOUS | 0 refills | Status: DC

## 2023-10-28 ENCOUNTER — Ambulatory Visit (INDEPENDENT_AMBULATORY_CARE_PROVIDER_SITE_OTHER): Payer: PRIVATE HEALTH INSURANCE

## 2023-10-28 DIAGNOSIS — J309 Allergic rhinitis, unspecified: Secondary | ICD-10-CM | POA: Diagnosis not present

## 2023-11-04 ENCOUNTER — Ambulatory Visit (INDEPENDENT_AMBULATORY_CARE_PROVIDER_SITE_OTHER): Payer: Self-pay | Admitting: *Deleted

## 2023-11-04 DIAGNOSIS — J309 Allergic rhinitis, unspecified: Secondary | ICD-10-CM | POA: Diagnosis not present

## 2023-11-06 ENCOUNTER — Ambulatory Visit: Payer: PRIVATE HEALTH INSURANCE | Admitting: Family Medicine

## 2023-11-06 ENCOUNTER — Encounter: Payer: Self-pay | Admitting: Family Medicine

## 2023-11-06 ENCOUNTER — Ambulatory Visit (INDEPENDENT_AMBULATORY_CARE_PROVIDER_SITE_OTHER): Payer: PRIVATE HEALTH INSURANCE

## 2023-11-06 VITALS — BP 122/84 | HR 109 | Temp 97.7°F | Resp 18 | Ht 65.0 in | Wt 312.7 lb

## 2023-11-06 DIAGNOSIS — R400 Somnolence: Secondary | ICD-10-CM

## 2023-11-06 DIAGNOSIS — G43809 Other migraine, not intractable, without status migrainosus: Secondary | ICD-10-CM | POA: Diagnosis not present

## 2023-11-06 DIAGNOSIS — Z6841 Body Mass Index (BMI) 40.0 and over, adult: Secondary | ICD-10-CM

## 2023-11-06 DIAGNOSIS — F988 Other specified behavioral and emotional disorders with onset usually occurring in childhood and adolescence: Secondary | ICD-10-CM

## 2023-11-06 DIAGNOSIS — G4733 Obstructive sleep apnea (adult) (pediatric): Secondary | ICD-10-CM

## 2023-11-06 DIAGNOSIS — F3131 Bipolar disorder, current episode depressed, mild: Secondary | ICD-10-CM

## 2023-11-06 DIAGNOSIS — J309 Allergic rhinitis, unspecified: Secondary | ICD-10-CM

## 2023-11-06 DIAGNOSIS — J4540 Moderate persistent asthma, uncomplicated: Secondary | ICD-10-CM

## 2023-11-06 MED ORDER — ZEPBOUND 12.5 MG/0.5ML ~~LOC~~ SOAJ
12.5000 mg | SUBCUTANEOUS | 0 refills | Status: DC
Start: 2023-11-06 — End: 2024-02-17

## 2023-11-10 ENCOUNTER — Ambulatory Visit (INDEPENDENT_AMBULATORY_CARE_PROVIDER_SITE_OTHER): Payer: Self-pay

## 2023-11-10 DIAGNOSIS — J309 Allergic rhinitis, unspecified: Secondary | ICD-10-CM | POA: Diagnosis not present

## 2023-11-14 ENCOUNTER — Encounter: Payer: Self-pay | Admitting: Nurse Practitioner

## 2023-11-14 ENCOUNTER — Ambulatory Visit: Payer: PRIVATE HEALTH INSURANCE | Admitting: Nurse Practitioner

## 2023-11-14 VITALS — BP 126/74 | HR 98 | Temp 99.7°F | Resp 18 | Ht 65.0 in | Wt 304.7 lb

## 2023-11-14 DIAGNOSIS — J029 Acute pharyngitis, unspecified: Secondary | ICD-10-CM

## 2023-11-14 DIAGNOSIS — R509 Fever, unspecified: Secondary | ICD-10-CM | POA: Diagnosis not present

## 2023-11-14 DIAGNOSIS — R051 Acute cough: Secondary | ICD-10-CM | POA: Diagnosis not present

## 2023-11-14 LAB — POCT INFLUENZA A/B
Influenza A, POC: NEGATIVE
Influenza B, POC: NEGATIVE

## 2023-11-14 MED ORDER — AZITHROMYCIN 250 MG PO TABS
ORAL_TABLET | ORAL | 0 refills | Status: AC
Start: 2023-11-14 — End: 2023-11-19

## 2023-11-14 MED ORDER — PROMETHAZINE-DM 6.25-15 MG/5ML PO SYRP: 5.0000 mL | ORAL_SOLUTION | Freq: Four times a day (QID) | ORAL | 0 refills | Status: DC | PRN

## 2023-11-14 NOTE — Progress Notes (Signed)
BP 126/74 (BP Location: Left Arm, Patient Position: Sitting, Cuff Size: Large)   Pulse 98   Temp 99.7 F (37.6 C) (Oral)   Resp 18   Ht 5\' 5"  (1.651 m) Comment: per patient  Wt (!) 304 lb 11.2 oz (138.2 kg)   SpO2 96%   BMI 50.70 kg/m    Subjective:    Patient ID: Tracey Perry, female    DOB: 10/15/1991, 32 y.o.   MRN: [id number]  HPI: Tracey Perry is a 32 y.o. female  Chief Complaint  Patient presents with   Cough   Fever   Sore Throat    X3 days    Discussed the use of AI scribe software for clinical note transcription with the patient, who gave verbal consent to proceed.  History of Present Illness   The patient presents with a severe sore throat that began on Christmas. She has tried multiple over-the-counter remedies including Tylenol, ibuprofen, Mucinex, chloraseptic,  with no relief. She recently completed a course of antibiotics for a sinus infection, which initially improved her symptoms, but she became ill again on Christmas Day. The patient denies coughing anything up. She has a history of allergies and is currently taking prednisone. patient reports that it hurts to swallow.  discussed pushing fluids.  she reports that it is difficult.  discussed using popsicles and she says it burns.  discussed the importance of hydration.       09/24/2023    9:42 AM 08/04/2023    8:12 AM 04/01/2023    9:10 AM  Depression screen PHQ 2/9  Decreased Interest 1 1 1   Down, Depressed, Hopeless 1 1 1   PHQ - 2 Score 2 2 2   Altered sleeping 1 3 3   Tired, decreased energy 1 3 3   Change in appetite 1 1 0  Feeling bad or failure about yourself  1 0 0  Trouble concentrating 1 1 1   Moving slowly or fidgety/restless 1 0 0  Suicidal thoughts 0 0 0  PHQ-9 Score 8 10 9   Difficult doing work/chores Not difficult at all Somewhat difficult     Relevant past medical, surgical, family and social history reviewed and updated as indicated. Interim medical history since our last  visit reviewed. Allergies and medications reviewed and updated.  Review of Systems  Ten systems reviewed and is negative except as mentioned in HPI      Objective:    BP 126/74 (BP Location: Left Arm, Patient Position: Sitting, Cuff Size: Large)   Pulse 98   Temp 99.7 F (37.6 C) (Oral)   Resp 18   Ht 5\' 5"  (1.651 m) Comment: per patient  Wt (!) 304 lb 11.2 oz (138.2 kg)   SpO2 96%   BMI 50.70 kg/m    Wt Readings from Last 3 Encounters:  11/14/23 (!) 304 lb 11.2 oz (138.2 kg)  11/06/23 (!) 312 lb 11.2 oz (141.8 kg)  10/20/23 (!) 304 lb (137.9 kg)    Physical Exam  Constitutional: Patient appears well-developed and well-nourished. Obese  No distress.  HEENT: head atraumatic, normocephalic, pupils equal and reactive to light, ears TMs clear, neck supple, throat erythematous, no lymphadenopathy, no facial tenderness Cardiovascular: Normal rate, regular rhythm and normal heart sounds.  No murmur heard. No BLE edema. Pulmonary/Chest: Effort normal and breath sounds normal. No respiratory distress. Abdominal: Soft.  There is no tenderness. Psychiatric: Patient has a normal mood and affect. behavior is normal. Judgment and thought content normal.  Results for orders  placed or performed in visit on 11/14/23  POCT Influenza A/B   Collection Time: 11/14/23  9:24 AM  Result Value Ref Range   Influenza A, POC Negative Negative   Influenza B, POC Negative Negative       Assessment & Plan:   Problem List Items Addressed This Visit   None Visit Diagnoses       Fever, unspecified fever cause    -  Primary   Relevant Orders   POCT Influenza A/B (Completed)   Novel Coronavirus, NAA ([facility])     Acute cough       Relevant Medications   promethazine-dextromethorphan (PROMETHAZINE-DM) 6.25-15 MG/5ML syrup   Other Relevant Orders   POCT Influenza A/B (Completed)   Novel Coronavirus, NAA ([facility])     Sore throat       Relevant Medications   azithromycin (ZITHROMAX) 250 MG  tablet   Other Relevant Orders   POCT Influenza A/B (Completed)   Novel Coronavirus, NAA ([facility])   Culture, Group A Strep        Assessment and Plan    Pharyngitis Severe sore throat since Christmas. Flu test negative, COVID test pending. No cough production or lymphadenopathy. Plan for throat culture to rule out streptococcal pharyngitis. -Prescribe Azithromycin to cover potential streptococcal infection. -Continue supportive care with Tylenol, ibuprofen, Mucinex, and Chloraseptic as needed. -push fluids  Cough Persistent cough without production. No evidence of pneumonia on examination. -declined Tessalon Perles, phenergan-DM sent in -Prescribe cough syrup for symptomatic relief, with caution regarding potential sedation.        Follow up plan: Return if symptoms worsen or fail to improve.

## 2023-11-15 ENCOUNTER — Encounter: Payer: Self-pay | Admitting: Nurse Practitioner

## 2023-11-15 LAB — NOVEL CORONAVIRUS, NAA: SARS-CoV-2, NAA: NOT DETECTED

## 2023-11-16 LAB — CULTURE, GROUP A STREP
Micro Number: 15897332
SPECIMEN QUALITY:: ADEQUATE

## 2023-11-24 ENCOUNTER — Other Ambulatory Visit: Payer: Self-pay | Admitting: Nurse Practitioner

## 2023-11-24 ENCOUNTER — Ambulatory Visit (HOSPITAL_BASED_OUTPATIENT_CLINIC_OR_DEPARTMENT_OTHER)
Admission: RE | Admit: 2023-11-24 | Discharge: 2023-11-24 | Disposition: A | Discharge status: Home / Self Care | Payer: PRIVATE HEALTH INSURANCE | Source: Ambulatory Visit | Attending: Nurse Practitioner | Admitting: Nurse Practitioner

## 2023-11-24 DIAGNOSIS — R052 Subacute cough: Secondary | ICD-10-CM | POA: Insufficient documentation

## 2023-12-03 NOTE — Patient Instructions (Addendum)
  1.  Continue allergen immunotherapy and have access to EpiPen  2. Treat and prevent inflammation of airway:   A. Symbicort 160 - 2 inhalations 1-2 times per day w/ spacer (empty lungs)  B. Ryaltris - 1 spray each nostril 1-2 times per day (SP)  C. Montelukast 10 mg - 1 tablet 1 time per day  3. If needed:   A. Epi-pen, benadryl, MD/ER evaluation for allergic reaction  B. Xyzal / Claritin - 1-2 times per day  C. Pataday - 1 drop each eye 1 time per day  D. Symbicort 160 - 2 inhalations every 6 hours  4.  Lab orders have been placed to help evaluate your immune system. We will call you when the results become available  5. Follow-up in 6 months

## 2023-12-03 NOTE — Progress Notes (Signed)
[address] [place] [address] Dept: [phone]  FOLLOW UP NOTE  Patient ID: EZRI FASULO, female    DOB: 31-Jan-1991  Age: 33 y.o. MRN: [id number] Date of Office Visit: 12/04/2023  Assessment  Chief Complaint: Follow-up  HPI Tracey Perry is a 33 year old female who presents to the clinic for a follow-up visit.  She was last seen in this clinic on 08/26/2023 by Dr. Lucie Leather for evaluation of asthma, allergic rhinitis, allergic conjunctivitis, and urticaria.  In the interim, she reports that she has recently had a bout of bronchitis for which she was prescribed azithromycin with relief of symptoms.  She had a follow-up chest x-ray on 11/24/2023 indicating no active cardiopulmonary disease.    At today's visit, she reports her asthma has been moderately well-controlled with occasional shortness of breath and occasional cough producing clear mucus.  She reports her asthma had been well-controlled before her most recent episode of bronchitis.  She continues Symbicort 160 between 4 and 7 days a week and uses albuterol about 6 or 7 times a year.  She does report an episode with either pneumonia or bronchitis requiring antibiotics about once a year generally occurring in December.  Allergic rhinitis is reported as moderately well-controlled with symptoms including clear rhinorrhea, nasal congestion, sneezing, and postnasal drainage.  She continues Ryaltris 2 sprays in each nostril twice a day and takes Xyzal and Claritin once a day.  She does report that in the spring season she increases Xyzal and Claritin to twice a day each with relief of symptoms.  She continues allergen immunotherapy with 1 noted large reaction occurring about 2 weeks ago.  She denies concomitant cardiopulmonary or gastrointestinal symptoms with this large local reaction.  She reports this area remain red for about 36 hours before total resolution. Her last environmental allergy testing via lab was on an 06/2023 and was  positive to tree pollen, grass pollen, cat, dog, dust mite, and weed pollens.  She began allergen immunotherapy on 09/24/2023.  EpiPen set is up-to-date.  Allergic conjunctivitis is reported as moderately well-controlled with olopatadine as needed.  Chronic conjunctivitis is reported as well-controlled with no breakouts since her last visit to this clinic.  She continues Xyzal and Claritin at least daily with relief of symptoms.  She reports getting a sinus infection requiring antibiotics about every other month over the last several years.  She reports that she has previously seen an ENT specialist as a child, however, cannot recall any specific diagnosis or treatments.  She is current on childhood vaccines.  Her current medications are listed in the chart.   Findings Reviewed: EXAM: CHEST - 2 VIEW COMPARISON:  10/25/2022 FINDINGS: The heart size and mediastinal contours are within normal limits. Both lungs are clear. The visualized skeletal structures are unremarkable. IMPRESSION: No active cardiopulmonary disease. Electronically Signed   By: Judie Petit.  Shick M.D.   On: 11/24/2023 15:15 Discussed the use of AI scribe software for clinical note transcription with the patient, who gave verbal consent to proceed.  Drug Allergies:  Allergies  Allergen Reactions   Amoxicillin-Pot Clavulanate Other (See Comments)    Mood swings Mood swings Mood swings   Apple Itching and Swelling    ONLY certain "FRESH FRUITS" cause throat itches & in spring, throat feels like it will swell.  (apples, peaches, pears, plums, mango)   Prednisone Palpitations and Other (See Comments)    Severe mood swings and tachycardia   Tape Itching    Physical Exam: BP  110/60   Pulse (!) 101   Temp 98.4 F (36.9 C) (Temporal)   Resp 16   Ht 5' 5.75" (1.67 m)   Wt (!) 308 lb 8 oz (139.9 kg)   SpO2 98%   BMI 50.18 kg/m    Physical Exam Vitals reviewed.  Constitutional:      Appearance: Normal appearance.   HENT:     Head: Normocephalic and atraumatic.     Right Ear: Tympanic membrane normal.     Left Ear: Tympanic membrane normal.     Nose:     Comments: Bilateral nares edematous and pale with thin clear nasal drainage noted.  Pharynx normal.  Ears normal.  Eyes normal.    Mouth/Throat:     Pharynx: Oropharynx is clear.  Eyes:     Conjunctiva/sclera: Conjunctivae normal.  Cardiovascular:     Rate and Rhythm: Normal rate and regular rhythm.     Heart sounds: Normal heart sounds. No murmur heard. Pulmonary:     Effort: Pulmonary effort is normal.     Breath sounds: Normal breath sounds.     Comments: Lungs clear to auscultation Musculoskeletal:        General: Normal range of motion.     Cervical back: Normal range of motion and neck supple.  Skin:    General: Skin is warm and dry.  Neurological:     Mental Status: She is alert and oriented to person, place, and time.  Psychiatric:        Mood and Affect: Mood normal.        Behavior: Behavior normal.        Thought Content: Thought content normal.        Judgment: Judgment normal.     Assessment and Plan: 1. Asthma, moderate persistent, well-controlled   2. Perennial allergic rhinitis   3. Seasonal allergic rhinitis due to pollen   4. Seasonal allergic conjunctivitis   5. Recurrent infections   6. Allergic urticaria     Patient Instructions   1.  Continue allergen immunotherapy and have access to EpiPen  2. Treat and prevent inflammation of airway:   A. Symbicort 160 - 2 inhalations 1-2 times per day w/ spacer (empty lungs)  B. Ryaltris - 1 spray each nostril 1-2 times per day (SP)  C. Montelukast 10 mg - 1 tablet 1 time per day  3. If needed:   A. Epi-pen, benadryl, MD/ER evaluation for allergic reaction  B. Xyzal / Claritin - 1-2 times per day  C. Pataday - 1 drop each eye 1 time per day  D. Symbicort 160 - 2 inhalations every 6 hours  4.  Lab orders have been placed to help evaluate your immune system. We  will call you when the results become available  5. Follow-up in 6 months

## 2023-12-04 ENCOUNTER — Ambulatory Visit (INDEPENDENT_AMBULATORY_CARE_PROVIDER_SITE_OTHER): Payer: PRIVATE HEALTH INSURANCE | Admitting: Family Medicine

## 2023-12-04 ENCOUNTER — Encounter: Payer: Self-pay | Admitting: Family Medicine

## 2023-12-04 VITALS — BP 110/60 | HR 101 | Temp 98.4°F | Resp 16 | Ht 65.75 in | Wt 308.5 lb

## 2023-12-04 DIAGNOSIS — L500 Allergic urticaria: Secondary | ICD-10-CM

## 2023-12-04 DIAGNOSIS — J301 Allergic rhinitis due to pollen: Secondary | ICD-10-CM | POA: Diagnosis not present

## 2023-12-04 DIAGNOSIS — H1010 Acute atopic conjunctivitis, unspecified eye: Secondary | ICD-10-CM

## 2023-12-04 DIAGNOSIS — H1013 Acute atopic conjunctivitis, bilateral: Secondary | ICD-10-CM

## 2023-12-04 DIAGNOSIS — J45909 Unspecified asthma, uncomplicated: Secondary | ICD-10-CM | POA: Insufficient documentation

## 2023-12-04 DIAGNOSIS — J302 Other seasonal allergic rhinitis: Secondary | ICD-10-CM | POA: Insufficient documentation

## 2023-12-04 DIAGNOSIS — J4540 Moderate persistent asthma, uncomplicated: Secondary | ICD-10-CM

## 2023-12-04 DIAGNOSIS — B999 Unspecified infectious disease: Secondary | ICD-10-CM

## 2023-12-04 DIAGNOSIS — J3089 Other allergic rhinitis: Secondary | ICD-10-CM | POA: Diagnosis not present

## 2023-12-11 ENCOUNTER — Ambulatory Visit (INDEPENDENT_AMBULATORY_CARE_PROVIDER_SITE_OTHER): Payer: PRIVATE HEALTH INSURANCE | Admitting: *Deleted

## 2023-12-11 DIAGNOSIS — J309 Allergic rhinitis, unspecified: Secondary | ICD-10-CM

## 2023-12-11 LAB — CBC WITH DIFFERENTIAL/PLATELET
Basophils Absolute: 0 10*3/uL (ref 0.0–0.2)
Basos: 1 %
EOS (ABSOLUTE): 0.1 10*3/uL (ref 0.0–0.4)
Eos: 1 %
Hematocrit: 38.7 % (ref 34.0–46.6)
Hemoglobin: 12.3 g/dL (ref 11.1–15.9)
Immature Grans (Abs): 0 10*3/uL (ref 0.0–0.1)
Immature Granulocytes: 0 %
Lymphocytes Absolute: 1.5 10*3/uL (ref 0.7–3.1)
Lymphs: 25 %
MCH: 31.1 pg (ref 26.6–33.0)
MCHC: 31.8 g/dL (ref 31.5–35.7)
MCV: 98 fL — ABNORMAL HIGH (ref 79–97)
Monocytes Absolute: 0.4 10*3/uL (ref 0.1–0.9)
Monocytes: 7 %
Neutrophils Absolute: 3.9 10*3/uL (ref 1.4–7.0)
Neutrophils: 66 %
Platelets: 246 10*3/uL (ref 150–450)
RBC: 3.95 x10E6/uL (ref 3.77–5.28)
RDW: 12.2 % (ref 11.7–15.4)
WBC: 6 10*3/uL (ref 3.4–10.8)

## 2023-12-11 LAB — DIPHTHERIA / TETANUS ANTIBODY PANEL
Diphtheria Ab: 0.38 [IU]/mL (ref <0.10)
Tetanus Ab, IgG: 0.95 [IU]/mL (ref <0.10)

## 2023-12-11 LAB — IGG, IGA, IGM
IgA/Immunoglobulin A, Serum: 109 mg/dL (ref 87–352)
IgG (Immunoglobin G), Serum: 979 mg/dL (ref 586–1602)
IgM (Immunoglobulin M), Srm: 149 mg/dL (ref 26–217)

## 2023-12-11 LAB — COMPREHENSIVE METABOLIC PANEL
ALT: 10 [IU]/L (ref 0–32)
AST: 11 [IU]/L (ref 0–40)
Albumin: 4.1 g/dL (ref 3.9–4.9)
Alkaline Phosphatase: 116 [IU]/L (ref 44–121)
BUN/Creatinine Ratio: 19 (ref 9–23)
BUN: 16 mg/dL (ref 6–20)
Bilirubin Total: <0.2 mg/dL (ref 0.0–1.2)
CO2: 21 mmol/L (ref 20–29)
Calcium: 8.8 mg/dL (ref 8.7–10.2)
Chloride: 107 mmol/L — ABNORMAL HIGH (ref 96–106)
Creatinine, Ser: 0.84 mg/dL (ref 0.57–1.00)
Globulin, Total: 2.4 g/dL (ref 1.5–4.5)
Glucose: 108 mg/dL — ABNORMAL HIGH (ref 70–99)
Potassium: 4.8 mmol/L (ref 3.5–5.2)
Sodium: 142 mmol/L (ref 134–144)
Total Protein: 6.5 g/dL (ref 6.0–8.5)
eGFR: 93 mL/min/{1.73_m2} (ref >59)

## 2023-12-11 LAB — STREP PNEUMONIAE 23 SEROTYPES IGG
Pneumo Ab Type 12 (12F)*: 0.3 ug/mL — ABNORMAL LOW (ref >1.3)
Pneumo Ab Type 19 (19F)*: 2.6 ug/mL (ref >1.3)
Pneumo Ab Type 2*: 1.3 ug/mL — ABNORMAL LOW (ref >1.3)
Pneumo Ab Type 20*: 8.7 ug/mL (ref >1.3)
Pneumo Ab Type 22 (22F)*: 1.1 ug/mL — ABNORMAL LOW (ref >1.3)
Pneumo Ab Type 23 (23F)*: 5.4 ug/mL (ref >1.3)
Pneumo Ab Type 26 (6B)*: 22.1 ug/mL (ref >1.3)
Pneumo Ab Type 3*: 0.1 ug/mL — ABNORMAL LOW (ref >1.3)
Pneumo Ab Type 34 (10A)*: 3.2 ug/mL (ref >1.3)
Pneumo Ab Type 4*: 1.8 ug/mL (ref >1.3)
Pneumo Ab Type 43 (11A)*: 2.2 ug/mL (ref >1.3)
Pneumo Ab Type 5*: 3.2 ug/mL (ref >1.3)
Pneumo Ab Type 51 (7F)*: 8.8 ug/mL (ref >1.3)
Pneumo Ab Type 54 (15B)*: 17.6 ug/mL (ref >1.3)
Pneumo Ab Type 56 (18C)*: 1.4 ug/mL (ref >1.3)
Pneumo Ab Type 57 (19A)*: 5.2 ug/mL (ref >1.3)
Pneumo Ab Type 68 (9V)*: 1.1 ug/mL — ABNORMAL LOW (ref >1.3)
Pneumo Ab Type 70 (33F)*: 4.2 ug/mL (ref >1.3)
Pneumo Ab Type 8*: 21.3 ug/mL (ref >1.3)
Pneumo Ab Type 9 (9N)*: 0.9 ug/mL — ABNORMAL LOW (ref >1.3)

## 2023-12-11 LAB — COMPLEMENT, TOTAL

## 2023-12-12 ENCOUNTER — Encounter: Payer: Self-pay | Admitting: Podiatry

## 2023-12-12 ENCOUNTER — Ambulatory Visit: Payer: PRIVATE HEALTH INSURANCE | Admitting: Podiatry

## 2023-12-12 ENCOUNTER — Ambulatory Visit (INDEPENDENT_AMBULATORY_CARE_PROVIDER_SITE_OTHER): Payer: PRIVATE HEALTH INSURANCE

## 2023-12-12 DIAGNOSIS — M778 Other enthesopathies, not elsewhere classified: Secondary | ICD-10-CM

## 2023-12-12 MED ORDER — MELOXICAM 15 MG PO TABS: 15.0000 mg | ORAL_TABLET | Freq: Every day | ORAL | 0 refills | Status: AC

## 2023-12-12 MED ORDER — TRIAMCINOLONE ACETONIDE 10 MG/ML IJ SUSP
5.0000 mg | Freq: Once | INTRAMUSCULAR | Status: AC
  Administered 2023-12-12: 5 mg

## 2023-12-12 NOTE — Progress Notes (Unsigned)
Chief Complaint  Patient presents with   Foot Pain    Pain is located in top of LT foot. Starts at the base of her ankle and radiates to the 1st toe. With rest it did improve. Ongoing for approx. 3 wks-1 month. Describes pain as shooting/sharp. Also feels like there is some "warmth in the muscle". Worse with dorsiflexion.     HPI: 33 y.o. female presents today with above complaint.  Pain to the dorsum of the left foot, soreness with gait.  Locates this along the great toe to the ankle.  Believes that this could have been related to recent ingrown toenail procedure and that it alters the way she walks for period of time and then has been sore ever since.  Describes sharp shooting wound.  Worse with dorsiflexion.  Of note she also does endorse some tenderness to the left hallux lateral nail border site.  Past Medical History:  Diagnosis Date   Allergy    Anxiety    Depression    Gastroparesis    Herpes simplex without complication    Morbid obesity due to excess calories (HCC)    PMDD (premenstrual dysphoric disorder)    Vertigo    Vitamin D deficiency     Past Surgical History:  Procedure Laterality Date   ADENOIDECTOMY  05/2000   anterior cervical decompression  Left 12/12/2020   revision with removal of hardware and decompression  - done at [facility]   APPENDECTOMY  03/2013   CHOLECYSTECTOMY  09/2009   COLONOSCOPY WITH PROPOFOL N/A 04/30/2021   Procedure: COLONOSCOPY WITH PROPOFOL;  Surgeon: Toney Reil, MD;  Location: [facility] ENDOSCOPY;  Service: Gastroenterology;  Laterality: N/A;   TONSILLECTOMY  05/2000   TYMPANOSTOMY TUBE PLACEMENT      Allergies  Allergen Reactions   Amoxicillin-Pot Clavulanate Other (See Comments)    Mood swings Mood swings Mood swings   Apple Itching and Swelling    ONLY certain "FRESH FRUITS" cause throat itches & in spring, throat feels like it will swell.  (apples, peaches, pears, plums, mango)   Prednisone Palpitations and Other (See  Comments)    Severe mood swings and tachycardia   Tape Itching    ROS    Physical Exam: There were no vitals filed for this visit.  General: The patient is alert and oriented x3 in no acute distress.  Dermatology: Skin is warm, dry and supple bilateral lower extremities. Interspaces are clear of maceration and debris.  No ecchymosis noted.  Tenderness on palpation of the left hallux lateral nail border without evidence of regrowth at this point.  Some mild callus present here.  Vascular: Palpable pedal pulses bilaterally. Capillary refill within normal limits. .  No erythema or calor.  Neurological: Light touch sensation grossly intact bilateral feet.   Musculoskeletal Exam: Dorsal left foot edematous.  Pain with resisted dorsiflexion.  Dorsiflexion strength muscle strength 4/5.  Tenderness along palpation of the EHL tendon most severe around the first metatarsal base region.  Radiographic Exam: Left foot 3 weightbearing views 12/12/2023 Normal osseous mineralization. Joint spaces preserved.  No fractures or osseous irregularities noted.  Assessment/Plan of Care: 1. Extensor tendinitis of foot   2. Capsulitis of foot, left      Meds ordered this encounter  Medications   triamcinolone acetonide (KENALOG) 10 MG/ML injection 5 mg   meloxicam (MOBIC) 15 MG tablet    Sig: Take 1 tablet (15 mg total) by mouth daily for 21 days.  Dispense:  21 tablet    Refill:  0   None  Discussed clinical findings with patient today.  # Extensor tendinitis of left foot, capsulitis -Verbal consent obtained to administer corticosteroid injection to the left dorsal midfoot at the level of the EHL tendon.  Risks, benefits alternative therapies discussed with patient. -Alcohol skin prep.  Area was then injected with 0.5 cc of 1% lidocaine plain, 0.5 cc of half percent Marcaine plain, 0.5 cc of Kenalog 10.  Band-Aid applied.  Patient tolerated this well. -Cam boot immobilization, compressive anklets  sleeve dispensed. -Course of oral meloxicam. -Early rehabilitation exercises discussed with patient.  Regarding the tenderness over the left hallux lateral nail border, no obvious signs of recurrence at this point, we will monitor this closely.  Painful callus to the skin of the nail border was debrided as a courtesy today using a 15 blade without incident.  Follow-up in approximately 3 weeks. Willett Lefeber L. Marchia Bond, AACFAS [facility]     [address] [place], [place] [address]                Office [phone]  Fax [phone]

## 2023-12-15 NOTE — Progress Notes (Signed)
Can you please let this patient know that her immune screening looks great. Your immune workup was normal, which is a good thing. We first looked at immunoglobulins.  Immunoglobulins are proteins that bind to and neutralize bacteria and viruses. Your immunoglobulin levels were normal.  Next we checked your specific immunoglobulins to routine vaccinations.  You were protective against diphtheria. You were protective against tetanus.  We also looked at protection against a bacteria called Streptococcus pneumonia.  This is a bacteria that causes sinus infections, ear infections, and pneumonia.  You were protective to 17 out of 23 strains of Streptococcus pneumonia which is an excellent response.  We also looked at complement activity.  Complement as a protein made by your liver which helps your immune system to work more efficiently.  This activity was normal. With the immune system working well, I would recommend a referral to ENT for evaluation and treatment of frequent sinus infections. We are happy to make this referral. Thank you

## 2023-12-17 ENCOUNTER — Ambulatory Visit (INDEPENDENT_AMBULATORY_CARE_PROVIDER_SITE_OTHER): Payer: Self-pay

## 2023-12-17 DIAGNOSIS — J309 Allergic rhinitis, unspecified: Secondary | ICD-10-CM | POA: Diagnosis not present

## 2023-12-18 NOTE — Telephone Encounter (Signed)
Patient called back regarding her lab result. Patient also states she would like proceed with the ENT referral.

## 2023-12-19 ENCOUNTER — Ambulatory Visit (INDEPENDENT_AMBULATORY_CARE_PROVIDER_SITE_OTHER): Payer: PRIVATE HEALTH INSURANCE | Admitting: *Deleted

## 2023-12-19 ENCOUNTER — Ambulatory Visit: Payer: PRIVATE HEALTH INSURANCE | Admitting: Podiatry

## 2023-12-19 DIAGNOSIS — J309 Allergic rhinitis, unspecified: Secondary | ICD-10-CM

## 2023-12-23 ENCOUNTER — Ambulatory Visit (INDEPENDENT_AMBULATORY_CARE_PROVIDER_SITE_OTHER): Payer: PRIVATE HEALTH INSURANCE

## 2023-12-23 DIAGNOSIS — J309 Allergic rhinitis, unspecified: Secondary | ICD-10-CM

## 2024-01-01 ENCOUNTER — Ambulatory Visit (INDEPENDENT_AMBULATORY_CARE_PROVIDER_SITE_OTHER): Payer: Self-pay

## 2024-01-01 DIAGNOSIS — J309 Allergic rhinitis, unspecified: Secondary | ICD-10-CM | POA: Diagnosis not present

## 2024-01-01 NOTE — Telephone Encounter (Signed)
Can you please refer to ENT for frequent sinus infections requiring antibiotics. Thank you

## 2024-01-02 ENCOUNTER — Ambulatory Visit: Payer: PRIVATE HEALTH INSURANCE | Admitting: Podiatry

## 2024-01-02 ENCOUNTER — Encounter: Payer: Self-pay | Admitting: Podiatry

## 2024-01-02 DIAGNOSIS — M778 Other enthesopathies, not elsewhere classified: Secondary | ICD-10-CM

## 2024-01-02 NOTE — Progress Notes (Signed)
Chief Complaint  Patient presents with   Tendinitis    Patient states her left foot is not great, it doesn't seem to be getting better since last visit, patient states that her pain is still along her dorsal left hallux.    HPI: 33 y.o. female presents today with above complaint.  Here for follow-up evaluation of left foot EHL tendinitis.  She reports some improvement to to the pain to the dorsum of the ankle and foot.  Still reports pain to the level of the distal first metatarsal and distal to this.  Reports that her foot slides out of the boot.  She believes it is too big.  Does not report significant improvement from the oral meloxicam.  Has not had lasting improvement from the injection as far as where the pain is still present.  Past Medical History:  Diagnosis Date   Allergy    Anxiety    Depression    Gastroparesis    Herpes simplex without complication    Morbid obesity due to excess calories (HCC)    PMDD (premenstrual dysphoric disorder)    Vertigo    Vitamin D deficiency     Past Surgical History:  Procedure Laterality Date   ADENOIDECTOMY  05/2000   anterior cervical decompression  Left 12/12/2020   revision with removal of hardware and decompression  - done at [facility]   APPENDECTOMY  03/2013   CHOLECYSTECTOMY  09/2009   COLONOSCOPY WITH PROPOFOL N/A 04/30/2021   Procedure: COLONOSCOPY WITH PROPOFOL;  Surgeon: Toney Reil, MD;  Location: [facility] ENDOSCOPY;  Service: Gastroenterology;  Laterality: N/A;   TONSILLECTOMY  05/2000   TYMPANOSTOMY TUBE PLACEMENT      Allergies  Allergen Reactions   Amoxicillin-Pot Clavulanate Other (See Comments)    Mood swings Mood swings Mood swings   Apple Itching and Swelling    ONLY certain "FRESH FRUITS" cause throat itches & in spring, throat feels like it will swell.  (apples, peaches, pears, plums, mango)   Prednisone Palpitations and Other (See Comments)    Severe mood swings and tachycardia   Tape Itching     ROS    Physical Exam: There were no vitals filed for this visit.  General: The patient is alert and oriented x3 in no acute distress.  Dermatology: Skin is warm, dry and supple bilateral lower extremities. Interspaces are clear of maceration and debris.  No ecchymosis noted.    Vascular: Palpable pedal pulses bilaterally. Capillary refill within normal limits. .  No erythema or calor.  Neurological: Light touch sensation grossly intact bilateral feet.   Musculoskeletal Exam: Dorsal left foot edematous.  Pain with resisted dorsiflexion.  Dorsiflexion strength muscle strength 4/5.  Tenderness along palpation of the EHL at the level of the distal first metatarsal into the first toe.  Pain improved to the dorsum of the foot and anterior ankle.  Radiographic Exam: Left foot 3 weightbearing views 12/12/2023 Normal osseous mineralization. Joint spaces preserved.  No fractures or osseous irregularities noted.  Assessment/Plan of Care: 1. Extensor tendinitis of foot      No orders of the defined types were placed in this encounter.  None  Discussed clinical findings with patient today.  # Extensor tendinitis of left foot, capsulitis - Residual cam boot too large for the patient as this did not immobilize the toe and patient reports that the foot slides out easily -Exchanging boot for tall, medium boot for more snug fit.  Demonstrated proper application  with the patient. -Does note some improvement with this though states that it was difficult to mobilize the first toe entirely. -Can continue taking over-the-counter ibuprofen as needed for pain and swelling -Continue RICE therapy -Continue cam boot immobilization  Follow-up in approximately 3-4 weeks. Peightyn Roberson L. Marchia Bond, AACFAS [facility]     [address] [place], [place] [address]                Office [phone]  Fax [phone]

## 2024-01-03 ENCOUNTER — Encounter: Payer: Self-pay | Admitting: Podiatry

## 2024-01-06 ENCOUNTER — Ambulatory Visit (INDEPENDENT_AMBULATORY_CARE_PROVIDER_SITE_OTHER): Payer: Self-pay | Admitting: *Deleted

## 2024-01-06 DIAGNOSIS — J309 Allergic rhinitis, unspecified: Secondary | ICD-10-CM | POA: Diagnosis not present

## 2024-01-08 ENCOUNTER — Ambulatory Visit (INDEPENDENT_AMBULATORY_CARE_PROVIDER_SITE_OTHER): Payer: PRIVATE HEALTH INSURANCE

## 2024-01-08 DIAGNOSIS — J309 Allergic rhinitis, unspecified: Secondary | ICD-10-CM

## 2024-01-13 ENCOUNTER — Ambulatory Visit (INDEPENDENT_AMBULATORY_CARE_PROVIDER_SITE_OTHER): Payer: PRIVATE HEALTH INSURANCE | Admitting: *Deleted

## 2024-01-13 DIAGNOSIS — J309 Allergic rhinitis, unspecified: Secondary | ICD-10-CM | POA: Diagnosis not present

## 2024-01-14 ENCOUNTER — Encounter: Payer: Self-pay | Admitting: Family Medicine

## 2024-01-15 ENCOUNTER — Ambulatory Visit (INDEPENDENT_AMBULATORY_CARE_PROVIDER_SITE_OTHER): Payer: PRIVATE HEALTH INSURANCE | Admitting: *Deleted

## 2024-01-15 DIAGNOSIS — J309 Allergic rhinitis, unspecified: Secondary | ICD-10-CM | POA: Diagnosis not present

## 2024-01-20 ENCOUNTER — Ambulatory Visit (INDEPENDENT_AMBULATORY_CARE_PROVIDER_SITE_OTHER): Payer: PRIVATE HEALTH INSURANCE

## 2024-01-20 DIAGNOSIS — J309 Allergic rhinitis, unspecified: Secondary | ICD-10-CM

## 2024-01-27 ENCOUNTER — Ambulatory Visit (INDEPENDENT_AMBULATORY_CARE_PROVIDER_SITE_OTHER): Payer: Self-pay | Admitting: *Deleted

## 2024-01-27 DIAGNOSIS — J309 Allergic rhinitis, unspecified: Secondary | ICD-10-CM | POA: Diagnosis not present

## 2024-01-29 ENCOUNTER — Ambulatory Visit (INDEPENDENT_AMBULATORY_CARE_PROVIDER_SITE_OTHER): Payer: PRIVATE HEALTH INSURANCE

## 2024-01-29 ENCOUNTER — Encounter: Payer: Self-pay | Admitting: Podiatry

## 2024-01-29 ENCOUNTER — Ambulatory Visit: Payer: PRIVATE HEALTH INSURANCE | Admitting: Podiatry

## 2024-01-29 DIAGNOSIS — M778 Other enthesopathies, not elsewhere classified: Secondary | ICD-10-CM | POA: Diagnosis not present

## 2024-01-29 DIAGNOSIS — J309 Allergic rhinitis, unspecified: Secondary | ICD-10-CM

## 2024-01-29 NOTE — Progress Notes (Signed)
 Chief Complaint  Patient presents with   Foot Pain    F/U for Tendinitis of left foot. Pt stating it is better than it was before, no longer sensitive to touch. Boot is causing irritation to leg has caused some cutting. Pt states that the boot doesn't immobilize ankle as her ankle rolls.    HPI: 33 y.o. female presents today with above complaint.  Here for follow-up evaluation of left foot EHL tendinitis.  She reports doing significantly better.  Does report intermittent soreness, 2/10-3/10 pain mostly along anterior ankle.  Past Medical History:  Diagnosis Date   Allergy    Anxiety    Depression    Gastroparesis    Herpes simplex without complication    Morbid obesity due to excess calories (HCC)    PMDD (premenstrual dysphoric disorder)    Vertigo    Vitamin D deficiency     Past Surgical History:  Procedure Laterality Date   ADENOIDECTOMY  05/2000   anterior cervical decompression  Left 12/12/2020   revision with removal of hardware and decompression  - done at [facility]   APPENDECTOMY  03/2013   CHOLECYSTECTOMY  09/2009   COLONOSCOPY WITH PROPOFOL N/A 04/30/2021   Procedure: COLONOSCOPY WITH PROPOFOL;  Surgeon: Toney Reil, MD;  Location: [facility] ENDOSCOPY;  Service: Gastroenterology;  Laterality: N/A;   TONSILLECTOMY  05/2000   TYMPANOSTOMY TUBE PLACEMENT      Allergies  Allergen Reactions   Amoxicillin-Pot Clavulanate Other (See Comments)    Mood swings Mood swings Mood swings   Apple Itching and Swelling    ONLY certain "FRESH FRUITS" cause throat itches & in spring, throat feels like it will swell.  (apples, peaches, pears, plums, mango)   Prednisone Palpitations and Other (See Comments)    Severe mood swings and tachycardia   Tape Itching    ROS    Physical Exam: There were no vitals filed for this visit.  General: The patient is alert and oriented x3 in no acute distress.  Dermatology: Skin is warm, dry and supple bilateral lower extremities.  Interspaces are clear of maceration and debris.  No ecchymosis noted.    Vascular: Palpable pedal pulses bilaterally. Capillary refill within normal limits. No erythema or calor.  No edema  Neurological: Light touch sensation grossly intact bilateral feet.   Musculoskeletal Exam: No tenderness on palpation of dorsal EHL.  Muscle strength 5/5 all major muscle groups.  Radiographic Exam: Left foot 3 weightbearing views 12/12/2023 Normal osseous mineralization. Joint spaces preserved.  No fractures or osseous irregularities noted.  Assessment/Plan of Care: 1. Extensor tendinitis of foot      No orders of the defined types were placed in this encounter.  None  Discussed clinical findings with patient today.  # Extensor tendinitis of left foot -Reports significant improvement -Can wean herself back into regular shoe gear -Avoid high-impact activity for another 3 to 4 weeks -Continue home extensor tendinitis exercises -RICE therapy as needed  Follow-up as needed Kealohilani Maiorino L. Marchia Bond, AACFAS [facility]     [address] [place], [place] [address]                Office [phone]  Fax [phone]

## 2024-02-03 ENCOUNTER — Ambulatory Visit (INDEPENDENT_AMBULATORY_CARE_PROVIDER_SITE_OTHER): Payer: PRIVATE HEALTH INSURANCE | Admitting: *Deleted

## 2024-02-03 DIAGNOSIS — J309 Allergic rhinitis, unspecified: Secondary | ICD-10-CM

## 2024-02-05 ENCOUNTER — Ambulatory Visit (INDEPENDENT_AMBULATORY_CARE_PROVIDER_SITE_OTHER): Payer: Self-pay

## 2024-02-05 DIAGNOSIS — J309 Allergic rhinitis, unspecified: Secondary | ICD-10-CM | POA: Diagnosis not present

## 2024-02-10 ENCOUNTER — Ambulatory Visit (INDEPENDENT_AMBULATORY_CARE_PROVIDER_SITE_OTHER): Payer: Self-pay | Admitting: *Deleted

## 2024-02-10 DIAGNOSIS — J309 Allergic rhinitis, unspecified: Secondary | ICD-10-CM

## 2024-02-17 ENCOUNTER — Ambulatory Visit (INDEPENDENT_AMBULATORY_CARE_PROVIDER_SITE_OTHER): Payer: PRIVATE HEALTH INSURANCE | Admitting: *Deleted

## 2024-02-17 ENCOUNTER — Encounter: Payer: Self-pay | Admitting: Family Medicine

## 2024-02-17 ENCOUNTER — Ambulatory Visit: Payer: PRIVATE HEALTH INSURANCE | Admitting: Family Medicine

## 2024-02-17 ENCOUNTER — Telehealth: Payer: Self-pay

## 2024-02-17 VITALS — BP 106/64 | HR 98 | Resp 16 | Ht 65.75 in | Wt 284.7 lb

## 2024-02-17 DIAGNOSIS — G4733 Obstructive sleep apnea (adult) (pediatric): Secondary | ICD-10-CM

## 2024-02-17 DIAGNOSIS — Z6841 Body Mass Index (BMI) 40.0 and over, adult: Secondary | ICD-10-CM

## 2024-02-17 DIAGNOSIS — F988 Other specified behavioral and emotional disorders with onset usually occurring in childhood and adolescence: Secondary | ICD-10-CM | POA: Diagnosis not present

## 2024-02-17 DIAGNOSIS — J309 Allergic rhinitis, unspecified: Secondary | ICD-10-CM

## 2024-02-17 DIAGNOSIS — G43009 Migraine without aura, not intractable, without status migrainosus: Secondary | ICD-10-CM

## 2024-02-17 DIAGNOSIS — E538 Deficiency of other specified B group vitamins: Secondary | ICD-10-CM

## 2024-02-17 DIAGNOSIS — F3131 Bipolar disorder, current episode depressed, mild: Secondary | ICD-10-CM | POA: Diagnosis not present

## 2024-02-17 MED ORDER — ZEPBOUND 12.5 MG/0.5ML ~~LOC~~ SOAJ: 12.5000 mg | SUBCUTANEOUS | 0 refills | Status: DC

## 2024-02-17 NOTE — Telephone Encounter (Signed)
 PA done waiting on insurance to determine

## 2024-02-17 NOTE — Progress Notes (Signed)
 Name: Tracey Perry   MRN: [id number]    DOB: [birth year]   Date:02/17/2024       Progress Note  Subjective  Chief Complaint  Chief Complaint  Patient presents with   Medical Management of Chronic Issues   HPI   Migraine headaches: She states migraine is stable, episodes about 2  times  a month but when severe needs to go to work late or leave, she had PT but did not make a major changed. Improvement of symptoms happened when she started on Emgality and Nurtec, Indomethacin, Propanolol and also Elavil  . She states she has photophobia and phonophobia and needs to be a in a quiet room. Sometimes unable to drive or function when migraine is present.   Asthma Moderate  persistent : she takes AR, allergy shots and inhalers and also Symbicort    Morbid Obesity: she started on Wegovy April 2023 , start weight was 331 lbs, weight was down to  294.6 lbs however having difficulty going down to that level again so we switched to Zepbound back in Sep 2024 and her weight was 303 lbs, she was finally able to titrate dose to 12.5 mg in January and weight is finally down to 284.7 lbs and has achieved the weight loss. Pant size is  down from size 26 to 22-24  She is eating smaller portions, also meal prepping.    OSA: She wears CPAP every night . She is also on Ritalin for ADD and has helped with daytime somnolence    Herpes type 1: doing well on prn valtrex, episodes about 4 times per year with suppressive therapy otherwise episodes lasts a long time and is very frequent Unchanged    Bipolar disorder: she is feeling much better, since started on stimulants for ADD she has more energy and motivation. She still sees psychiatrist . Stable, she also has seasonal affective disorder and use UV light therapy     Patient Active Problem List   Diagnosis Date Noted   Asthma, well controlled 12/04/2023   Perennial allergic rhinitis 12/04/2023   Seasonal allergic conjunctivitis 12/04/2023   Recurrent infections  12/04/2023   Allergic urticaria 12/04/2023   Intractable migraine with aura with status migrainosus 12/13/2022   B12 deficiency 11/26/2022   Moderate persistent allergic asthma without complication 06/07/2022   Morbid obesity with BMI of 50.0-59.9, adult (HCC) 05/08/2022   Episodic tension-type headache, not intractable 04/25/2021   Idiopathic stabbing headache 04/25/2021   Anxiety 12/29/2020   Cervical spondylosis with myelopathy and radiculopathy 12/11/2020   Cervical stenosis of spinal canal 12/11/2020   Chronic pain 12/06/2020   Cervical spinal stenosis 05/05/2020   Bulging of cervical intervertebral disc 05/05/2020   Cervical radiculopathy 05/05/2020   Bipolar 2 disorder (HCC) 12/15/2018   Fever blister 07/29/2016   Menorrhagia with irregular cycle 06/20/2016   OSA on CPAP 05/11/2016   Hypersomnia 12/25/2015   Cluster B personality disorder (HCC) 10/27/2015   H/O urinary tract infection 06/21/2015   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   Depression, major, recurrent, moderate (HCC) 06/21/2015   Gastroparesis 06/21/2015   Hx of cold sores 06/21/2015   Generalized anxiety disorder 05/31/2015   History of pyelonephritis 05/30/2015   Dysmenorrhea 05/30/2015   History of asthma 05/30/2015   Migraine without aura and responsive to treatment 05/30/2015   Allergic rhinitis, seasonal 05/30/2015   Vertigo 05/30/2015   IBS (irritable bowel syndrome) 09/20/2013   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   H/O cervical spine surgery 12/29/2012   Vitamin  D deficiency 11/23/2009   History of mononucleosis 04/03/2009    Past Surgical History:  Procedure Laterality Date   ADENOIDECTOMY  05/2000   anterior cervical decompression  Left 12/12/2020   revision with removal of hardware and decompression  - done at [facility]   APPENDECTOMY  03/2013   CHOLECYSTECTOMY  09/2009   COLONOSCOPY WITH PROPOFOL N/A 04/30/2021   Procedure: COLONOSCOPY WITH PROPOFOL;  Surgeon: Toney Reil, MD;  Location:  [facility];  Service: Gastroenterology;  Laterality: N/A;   TONSILLECTOMY  05/2000   TYMPANOSTOMY TUBE PLACEMENT      Family History  Problem Relation Age of Onset   Urticaria Mother    Depression Mother    Hypertension Mother        controlled   Allergic rhinitis Mother    Anxiety disorder Mother    Diabetes Father        controlled   Hypertension Father    Allergic rhinitis Father    Depression Father    Depression Brother    Anxiety disorder Brother    Allergic rhinitis Maternal Grandmother     Social History   Tobacco Use   Smoking status: Never   Smokeless tobacco: Never  Substance Use Topics   Alcohol use: Not Currently    Alcohol/week: 0.0 standard drinks of alcohol     Current Outpatient Medications:    amitriptyline (ELAVIL) 25 MG tablet, Take 25 mg by mouth at bedtime. neurologist, Disp: , Rfl:    baclofen (LIORESAL) 10 MG tablet, Take 10 mg by mouth 3 (three) times daily., Disp: , Rfl:    budesonide-formoterol (SYMBICORT) 160-4.5 MCG/ACT inhaler, Inhale 2 puffs into the lungs 2 (two) times daily., Disp: 30.6 g, Rfl: 1   cholecalciferol (VITAMIN D3) 25 MCG (1000 UNIT) tablet, Take 1,000 Units by mouth daily., Disp: , Rfl:    clonazePAM (KLONOPIN) 0.5 MG tablet, Take 1 tablet (0.5 mg total) by mouth 2 (two) times daily., Disp: 60 tablet, Rfl: 4   cyanocobalamin (VITAMIN B12) 250 MCG tablet, Take by mouth., Disp: , Rfl:    Drospirenone (SLYND) 4 MG TABS, Take 4 mg by mouth daily., Disp: , Rfl:    EPINEPHrine (EPIPEN 2-PAK) 0.3 mg/0.3 mL IJ SOAJ injection, Inject 0.3 mg into the muscle as needed for anaphylaxis., Disp: 0.3 mL, Rfl: 1   famotidine (PEPCID) 20 MG tablet, Take 20 mg by mouth 2 (two) times daily., Disp: , Rfl:    famotidine (PEPCID) 40 MG tablet, Take 40 mg by mouth daily., Disp: , Rfl:    fluticasone (FLONASE) 50 MCG/ACT nasal spray, Place into both nostrils daily., Disp: , Rfl:    Galcanezumab-gnlm (EMGALITY) 120 MG/ML SOAJ, Inject 1 each into  the skin every 30 (thirty) days., Disp: , Rfl:    indomethacin (INDOCIN SR) 75 MG CR capsule, Take 75 mg by mouth 2 (two) times daily with a meal., Disp: , Rfl:    ipratropium-albuterol (DUONEB) 0.5-2.5 (3) MG/3ML SOLN, Take 3 mLs by nebulization every 4 (four) hours as needed., Disp: 360 mL, Rfl: 2   lamoTRIgine (LAMICTAL) 200 MG tablet, Take 250 mg by mouth daily., Disp: , Rfl:    LamoTRIgine 250 MG TB24 24 hour tablet, Take 1 tablet by mouth daily., Disp: , Rfl:    levocetirizine (XYZAL) 5 MG tablet, Take 1 tablet (5 mg total) by mouth daily as needed for allergies (Can take an extra dose during flare ups.)., Disp: 180 tablet, Rfl: 1   loratadine (CLARITIN) 10 MG tablet, Take  1 tablet (10 mg total) by mouth daily as needed for allergies (Can take an extra dose during flare ups.)., Disp: 180 tablet, Rfl: 1   methylphenidate (METADATE CD) 10 MG CR capsule, Take by mouth., Disp: , Rfl:    methylphenidate (METADATE CD) 10 MG CR capsule, Take by mouth., Disp: , Rfl:    methylphenidate (RITALIN) 10 MG tablet, Take 10 mg by mouth 2 (two) times daily. Psychiatrist, Disp: , Rfl:    montelukast (SINGULAIR) 10 MG tablet, Take 1 tablet (10 mg total) by mouth every evening., Disp: 90 tablet, Rfl: 1   Olopatadine HCl (PATADAY) 0.2 % SOLN, Place 1 drop into both eyes 1 day or 1 dose., Disp: 7.5 mL, Rfl: 1   Olopatadine-Mometasone (RYALTRIS) 665-25 MCG/ACT SUSP, Place 1 spray into the nose 2 (two) times daily., Disp: 29 g, Rfl: 5   ondansetron (ZOFRAN-ODT) 8 MG disintegrating tablet, Take 1 tablet (8 mg total) by mouth every 8 (eight) hours as needed for nausea or vomiting., Disp: 20 tablet, Rfl: 6   propranolol ER (INDERAL LA) 160 MG SR capsule, Take 160 mg by mouth daily., Disp: , Rfl:    Rimegepant Sulfate (NURTEC) 75 MG TBDP, Take 1 tablet (75 mg total) by mouth as needed (take 1 tablet at onset of headache, max is 1 tablet in 24 hours)., Disp: 8 tablet, Rfl: 11   sertraline (ZOLOFT) 100 MG tablet, Take 100  mg by mouth daily., Disp: , Rfl:    Spacer/Aero-Holding Chambers DEVI, 1 Device by Does not apply route as directed., Disp: 1 each, Rfl: 1   tirzepatide (ZEPBOUND) 12.5 MG/0.5ML Pen, Inject 12.5 mg into the skin once a week., Disp: 6 mL, Rfl: 0   valACYclovir (VALTREX) 500 MG tablet, Take 1 tablet (500 mg total) by mouth daily. **TAKE 1 TABLET BY MOUTH 3 times daily during outbreaks ONLY **, Disp: 100 tablet, Rfl: 1  Allergies  Allergen Reactions   Amoxicillin-Pot Clavulanate Other (See Comments)    Mood swings Mood swings Mood swings   Apple Itching and Swelling    ONLY certain "FRESH FRUITS" cause throat itches & in spring, throat feels like it will swell.  (apples, peaches, pears, plums, mango)   Prednisone Palpitations and Other (See Comments)    Severe mood swings and tachycardia   Tape Itching    I personally reviewed active problem list, medication list, allergies with the patient/caregiver today.   ROS  Ten systems reviewed and is negative except as mentioned in HPI    Objective Physical Exam Constitutional: Patient appears well-developed and well-nourished. Obese  No distress.  HEENT: head atraumatic, normocephalic,, neck supple, throat within normal limits Cardiovascular: Normal rate, regular rhythm and normal heart sounds.  No murmur heard. No BLE edema. Pulmonary/Chest: Effort normal and breath sounds normal. No respiratory distress. Abdominal: Soft.  There is no tenderness. Psychiatric: Patient has a normal mood and affect. behavior is normal. Judgment and thought content normal.   Vitals:   02/17/24 0818  BP: 106/64  Pulse: 98  Resp: 16  SpO2: 99%  Weight: 284 lb 11.2 oz (129.1 kg)  Height: 5' 5.75" (1.67 m)    Body mass index is 46.3 kg/m.  Recent Results (from the past 2160 hours)  IgG, IgA, IgM     Status: None   Collection Time: 12/04/23  9:13 AM  Result Value Ref Range   IgG (Immunoglobin G), Serum 979 586 - 1,602 mg/dL   IgA/Immunoglobulin A,  Serum 109 87 - 352 mg/dL  IgM (Immunoglobulin M), Srm 149 26 - 217 mg/dL  Strep pneumoniae 23 Serotypes IgG     Status: Abnormal   Collection Time: 12/04/23  9:13 AM  Result Value Ref Range   Pneumo Ab Type 1* >17.4 >1.3 ug/mL   Pneumo Ab Type 3* 0.1 (L) >1.3 ug/mL   Pneumo Ab Type 4* 1.8 >1.3 ug/mL   Pneumo Ab Type 8* 21.3 >1.3 ug/mL   Pneumo Ab Type 9 (9N)* 0.9 (L) >1.3 ug/mL   Pneumo Ab Type 12 (42F)* 0.3 (L) >1.3 ug/mL   Pneumo Ab Type 14* >30.6 >1.3 ug/mL   Pneumo Ab Type 17 (43F)* >39.5 >1.3 ug/mL   Pneumo Ab Type 19 (3F)* 2.6 >1.3 ug/mL   Pneumo Ab Type 2* 1.3 (L) >1.3 ug/mL   Pneumo Ab Type 20* 8.7 >1.3 ug/mL   Pneumo Ab Type 22 (14F)* 1.1 (L) >1.3 ug/mL   Pneumo Ab Type 23 (63F)* 5.4 >1.3 ug/mL   Pneumo Ab Type 26 (6B)* 22.1 >1.3 ug/mL   Pneumo Ab Type 34 (10A)* 3.2 >1.3 ug/mL   Pneumo Ab Type 43 (11A)* 2.2 >1.3 ug/mL   Pneumo Ab Type 5* 3.2 >1.3 ug/mL   Pneumo Ab Type 51 (59F)* 8.8 >1.3 ug/mL   Pneumo Ab Type 54 (15B)* 17.6 >1.3 ug/mL   Pneumo Ab Type 56 (18C)* 1.4 >1.3 ug/mL   Pneumo Ab Type 57 (19A)* 5.2 >1.3 ug/mL   Pneumo Ab Type 68 (9V)* 1.1 (L) >1.3 ug/mL   Pneumo Ab Type 70 (49F)* 4.2 >1.3 ug/mL    Comment: *This test was developed and its performance characteristics determined by [employer]. It has not been cleared or approved by the U.S. Food and Drug Administration.  FLAG Interpretation: A = Abnormal, H = High, L = Low   Diphtheria / Tetanus Antibody Panel     Status: None   Collection Time: 12/04/23  9:13 AM  Result Value Ref Range   Tetanus Ab, IgG 0.95 <0.10 IU/mL    Comment:                              Interpretation:                                Non-Protective    <0.10                                Protective       >=0.10 Results for this test are for research purposes only by the assay's manufacturer.  The performance characteristics of this product have not been established.  Results should not be used as a diagnostic procedure  without confirmation of the diagnosis by another medically established diagnostic product or procedure.    Diphtheria Ab 0.38 <0.10 IU/mL    Comment:                              Interpretation:                                Non-Protective    <0.10  Protective       >=0.10 For research use only.   Complement, total     Status: None   Collection Time: 12/04/23  9:13 AM  Result Value Ref Range   Compl, Total (CH50) >60 >41 U/mL    Comment:              Age                Female          Female           1 - 30 days         Not Estab.     Not Estab.     31 days -  6 months          >32            >20    7 months - 17 years           >39            >39              >17 years           >41            >41  **NOTE: The adult (">17 years") reference interval**          range is used to flag abnormals on this          report. If the patient is 8 years old or          younger, use the table above to determine          out of range values.   CBC with Differential/Platelet     Status: Abnormal   Collection Time: 12/04/23  9:13 AM  Result Value Ref Range   WBC 6.0 3.4 - 10.8 x10E3/uL   RBC 3.95 3.77 - 5.28 x10E6/uL   Hemoglobin 12.3 11.1 - 15.9 g/dL   Hematocrit 24.4 01.0 - 46.6 %   MCV 98 (H) 79 - 97 fL   MCH 31.1 26.6 - 33.0 pg   MCHC 31.8 31.5 - 35.7 g/dL   RDW 27.2 53.6 - 64.4 %   Platelets 246 150 - 450 x10E3/uL   Neutrophils 66 Not Estab. %   Lymphs 25 Not Estab. %   Monocytes 7 Not Estab. %   Eos 1 Not Estab. %   Basos 1 Not Estab. %   Neutrophils Absolute 3.9 1.4 - 7.0 x10E3/uL   Lymphocytes Absolute 1.5 0.7 - 3.1 x10E3/uL   Monocytes Absolute 0.4 0.1 - 0.9 x10E3/uL   EOS (ABSOLUTE) 0.1 0.0 - 0.4 x10E3/uL   Basophils Absolute 0.0 0.0 - 0.2 x10E3/uL   Immature Granulocytes 0 Not Estab. %   Immature Grans (Abs) 0.0 0.0 - 0.1 x10E3/uL  Comprehensive metabolic panel     Status: Abnormal   Collection Time: 12/04/23  9:13 AM  Result Value Ref  Range   Glucose 108 (H) 70 - 99 mg/dL   BUN 16 6 - 20 mg/dL   Creatinine, Ser 0.34 0.57 - 1.00 mg/dL   eGFR 93 >74 QV/ZDG/3.87   BUN/Creatinine Ratio 19 9 - 23   Sodium 142 134 - 144 mmol/L   Potassium 4.8 3.5 - 5.2 mmol/L   Chloride 107 (H) 96 - 106 mmol/L   CO2 21 20 - 29 mmol/L   Calcium 8.8 8.7 - 10.2 mg/dL   Total Protein 6.5 6.0 - 8.5 g/dL  Albumin 4.1 3.9 - 4.9 g/dL   Globulin, Total 2.4 1.5 - 4.5 g/dL   Bilirubin Total <7.2 0.0 - 1.2 mg/dL   Alkaline Phosphatase 116 44 - 121 IU/L   AST 11 0 - 40 IU/L   ALT 10 0 - 32 IU/L    Diabetic Foot Exam:     PHQ2/9:    02/17/2024    8:17 AM 09/24/2023    9:42 AM 08/04/2023    8:12 AM 04/01/2023    9:10 AM 03/24/2023    7:43 AM  Depression screen PHQ 2/9  Decreased Interest 1 1 1 1 1   Down, Depressed, Hopeless 1 1 1 1  0  PHQ - 2 Score 2 2 2 2 1   Altered sleeping 0 1 3 3 3   Tired, decreased energy 0 1 3 3 3   Change in appetite 0 1 1 0 0  Feeling bad or failure about yourself  0 1 0 0 0  Trouble concentrating 1 1 1 1  0  Moving slowly or fidgety/restless 0 1 0 0 0  Suicidal thoughts 0 0 0 0 0  PHQ-9 Score 3 8 10 9 7   Difficult doing work/chores Somewhat difficult Not difficult at all Somewhat difficult      phq 9 is positive  Fall Risk:    11/14/2023    9:23 AM 10/20/2023    2:27 PM 09/24/2023    9:42 AM 08/04/2023    8:12 AM 04/01/2023    9:03 AM  Fall Risk   Falls in the past year? 0 0 0 1 0  Number falls in past yr:  0 0 0 0  Injury with Fall?  0 0 0 0  Risk for fall due to : No Fall Risks No Fall Risks No Fall Risks History of fall(s) No Fall Risks  Follow up Falls prevention discussed Falls prevention discussed Falls prevention discussed Falls prevention discussed;Education provided;Falls evaluation completed Falls prevention discussed     Assessment & Plan   1. Morbid obesity with BMI of 50.0-59.9, adult (HCC)  - tirzepatide (ZEPBOUND) 12.5 MG/0.5ML Pen; Inject 12.5 mg into the skin once a week.  Dispense: 6  mL; Refill: 0  2. OSA on CPAP (Primary)  Compliant   3. Bipolar affective disorder, currently depressed, mild (HCC)  Keep visits with psychiatrist  4. Attention deficit disorder, unspecified type  Doing well on current regiment  5. Migraine without aura and responsive to treatment  improved  6. B12 deficiency  Continue supplementation

## 2024-02-17 NOTE — Telephone Encounter (Signed)
 Prior Auth on    tirzepatide (ZEPBOUND) 12.5 MG/0.5ML Pen    Key [id number]

## 2024-02-26 ENCOUNTER — Ambulatory Visit (INDEPENDENT_AMBULATORY_CARE_PROVIDER_SITE_OTHER): Payer: PRIVATE HEALTH INSURANCE | Admitting: Otolaryngology

## 2024-02-26 ENCOUNTER — Encounter (INDEPENDENT_AMBULATORY_CARE_PROVIDER_SITE_OTHER): Payer: Self-pay

## 2024-02-26 ENCOUNTER — Encounter (INDEPENDENT_AMBULATORY_CARE_PROVIDER_SITE_OTHER): Payer: Self-pay | Admitting: Otolaryngology

## 2024-02-26 VITALS — BP 119/83 | HR 84 | Ht 65.0 in | Wt 284.0 lb

## 2024-02-26 DIAGNOSIS — J329 Chronic sinusitis, unspecified: Secondary | ICD-10-CM

## 2024-02-26 DIAGNOSIS — J3089 Other allergic rhinitis: Secondary | ICD-10-CM

## 2024-02-26 DIAGNOSIS — J342 Deviated nasal septum: Secondary | ICD-10-CM | POA: Diagnosis not present

## 2024-02-26 DIAGNOSIS — R0982 Postnasal drip: Secondary | ICD-10-CM

## 2024-02-26 DIAGNOSIS — J3489 Other specified disorders of nose and nasal sinuses: Secondary | ICD-10-CM

## 2024-02-26 DIAGNOSIS — R0981 Nasal congestion: Secondary | ICD-10-CM

## 2024-02-26 DIAGNOSIS — J343 Hypertrophy of nasal turbinates: Secondary | ICD-10-CM

## 2024-02-26 DIAGNOSIS — J45909 Unspecified asthma, uncomplicated: Secondary | ICD-10-CM | POA: Diagnosis not present
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
## 2024-02-27 ENCOUNTER — Encounter (INDEPENDENT_AMBULATORY_CARE_PROVIDER_SITE_OTHER): Payer: Self-pay

## 2024-03-02 ENCOUNTER — Ambulatory Visit (INDEPENDENT_AMBULATORY_CARE_PROVIDER_SITE_OTHER): Payer: PRIVATE HEALTH INSURANCE

## 2024-03-02 DIAGNOSIS — J309 Allergic rhinitis, unspecified: Secondary | ICD-10-CM

## 2024-03-09 ENCOUNTER — Ambulatory Visit (INDEPENDENT_AMBULATORY_CARE_PROVIDER_SITE_OTHER): Payer: PRIVATE HEALTH INSURANCE

## 2024-03-09 DIAGNOSIS — J309 Allergic rhinitis, unspecified: Secondary | ICD-10-CM

## 2024-03-15 ENCOUNTER — Ambulatory Visit: Payer: Self-pay

## 2024-03-18 ENCOUNTER — Ambulatory Visit (INDEPENDENT_AMBULATORY_CARE_PROVIDER_SITE_OTHER): Payer: PRIVATE HEALTH INSURANCE

## 2024-03-18 DIAGNOSIS — J309 Allergic rhinitis, unspecified: Secondary | ICD-10-CM

## 2024-03-24 ENCOUNTER — Ambulatory Visit (HOSPITAL_BASED_OUTPATIENT_CLINIC_OR_DEPARTMENT_OTHER)
Admission: RE | Admit: 2024-03-24 | Discharge: 2024-03-24 | Disposition: A | Discharge status: Home / Self Care | Payer: PRIVATE HEALTH INSURANCE | Source: Ambulatory Visit | Attending: Otolaryngology | Admitting: Otolaryngology

## 2024-03-24 DIAGNOSIS — J329 Chronic sinusitis, unspecified: Secondary | ICD-10-CM | POA: Diagnosis present

## 2024-03-25 ENCOUNTER — Encounter: Payer: PRIVATE HEALTH INSURANCE | Admitting: Family Medicine

## 2024-03-25 ENCOUNTER — Ambulatory Visit (INDEPENDENT_AMBULATORY_CARE_PROVIDER_SITE_OTHER): Payer: PRIVATE HEALTH INSURANCE

## 2024-03-25 DIAGNOSIS — J309 Allergic rhinitis, unspecified: Secondary | ICD-10-CM

## 2024-03-29 ENCOUNTER — Ambulatory Visit (INDEPENDENT_AMBULATORY_CARE_PROVIDER_SITE_OTHER): Payer: PRIVATE HEALTH INSURANCE | Admitting: Family Medicine

## 2024-03-29 ENCOUNTER — Encounter: Payer: Self-pay | Admitting: Family Medicine

## 2024-03-29 VITALS — BP 102/72 | HR 98 | Resp 16 | Ht 65.75 in | Wt 280.6 lb

## 2024-03-29 DIAGNOSIS — E538 Deficiency of other specified B group vitamins: Secondary | ICD-10-CM

## 2024-03-29 DIAGNOSIS — Z0000 Encounter for general adult medical examination without abnormal findings: Secondary | ICD-10-CM

## 2024-03-29 DIAGNOSIS — E559 Vitamin D deficiency, unspecified: Secondary | ICD-10-CM | POA: Diagnosis not present

## 2024-03-29 DIAGNOSIS — Z79899 Other long term (current) drug therapy: Secondary | ICD-10-CM

## 2024-03-29 DIAGNOSIS — Z13220 Encounter for screening for lipoid disorders: Secondary | ICD-10-CM | POA: Diagnosis not present

## 2024-03-29 DIAGNOSIS — Z131 Encounter for screening for diabetes mellitus: Secondary | ICD-10-CM

## 2024-03-29 NOTE — Progress Notes (Signed)
 Name: Tracey Perry   MRN: [id number]    DOB: [birth year]   Date:03/29/2024       Progress Note  Subjective  Chief Complaint  Chief Complaint  Patient presents with   Annual Exam    HPI  Patient presents for annual CPE.  Diet: eating smaller portions, meal prepping  Exercise: walks 5 minutes a few times a week.   Last Eye Exam: completed Last Dental Exam: completed  Flowsheet Row Office Visit from 03/29/2024 in [facility]  AUDIT-C Score 0      Depression: Phq 9 is  positive    03/29/2024    8:25 AM 02/17/2024    8:17 AM 09/24/2023    9:42 AM 08/04/2023    8:12 AM 04/01/2023    9:10 AM  Depression screen PHQ 2/9  Decreased Interest 1 1 1 1 1   Down, Depressed, Hopeless 1 1 1 1 1   PHQ - 2 Score 2 2 2 2 2   Altered sleeping 1 0 1 3 3   Tired, decreased energy 1 0 1 3 3   Change in appetite 1 0 1 1 0  Feeling bad or failure about yourself  1 0 1 0 0  Trouble concentrating 1 1 1 1 1   Moving slowly or fidgety/restless 0 0 1 0 0  Suicidal thoughts 0 0 0 0 0  PHQ-9 Score 7 3 8 10 9   Difficult doing work/chores Somewhat difficult Somewhat difficult Not difficult at all Somewhat difficult    Hypertension: BP Readings from Last 3 Encounters:  03/29/24 102/72  02/26/24 119/83  02/17/24 106/64   Obesity: Wt Readings from Last 3 Encounters:  03/29/24 280 lb 9.6 oz (127.3 kg)  02/26/24 284 lb (128.8 kg)  02/17/24 284 lb 11.2 oz (129.1 kg)   BMI Readings from Last 3 Encounters:  03/29/24 45.64 kg/m  02/26/24 47.26 kg/m  02/17/24 46.30 kg/m     Vaccines: reviewed with the patient.   Hep C Screening: completed STD testing and prevention (HIV/chl/gon/syphilis): N/A Intimate partner violence: negative screen  Sexual History : not currently  Menstrual History/LMP/Abnormal Bleeding: no cycles  Discussed importance of follow up if any post-menopausal bleeding: not applicable  Incontinence Symptoms: negative for symptoms   Breast cancer:  -  Last Mammogram: N/A - BRCA gene screening: N/A  Osteoporosis Prevention : Discussed high calcium and vitamin D  supplementation, weight bearing exercises Bone density :not applicable   Cervical cancer screening: up-to-date repeat next year   Skin cancer: Discussed monitoring for atypical lesions  Colorectal cancer: N/A   ECG: 2022  Advanced Care Planning: A voluntary discussion about advance care planning including the explanation and discussion of advance directives.  Discussed health care proxy and Living will, and the patient was able to identify a health care proxy as mother .  Patient does not have a living will and power of attorney of health care   Patient Active Problem List   Diagnosis Date Noted   Asthma, well controlled 12/04/2023   Perennial allergic rhinitis 12/04/2023   Seasonal allergic conjunctivitis 12/04/2023   Recurrent infections 12/04/2023   Allergic urticaria 12/04/2023   Intractable migraine with aura with status migrainosus 12/13/2022   B12 deficiency 11/26/2022   Moderate persistent allergic asthma without complication 06/07/2022   Morbid obesity with BMI of 50.0-59.9, adult (HCC) 05/08/2022   Episodic tension-type headache, not intractable 04/25/2021   Idiopathic stabbing headache 04/25/2021   Anxiety 12/29/2020   Cervical spondylosis with myelopathy and radiculopathy 12/11/2020  Cervical stenosis of spinal canal 12/11/2020   Chronic pain 12/06/2020   Cervical spinal stenosis 05/05/2020   Bulging of cervical intervertebral disc 05/05/2020   Cervical radiculopathy 05/05/2020   Bipolar 2 disorder (HCC) 12/15/2018   Fever blister 07/29/2016   Menorrhagia with irregular cycle 06/20/2016   OSA on CPAP 05/11/2016   Hypersomnia 12/25/2015   Cluster B personality disorder (HCC) 10/27/2015   H/O urinary tract infection 06/21/2015   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   Depression, major, recurrent, moderate (HCC) 06/21/2015   Gastroparesis 06/21/2015   Hx  of cold sores 06/21/2015   Generalized anxiety disorder 05/31/2015   History of pyelonephritis 05/30/2015   Dysmenorrhea 05/30/2015   History of asthma 05/30/2015   Migraine without aura and responsive to treatment 05/30/2015   Allergic rhinitis, seasonal 05/30/2015   Vertigo 05/30/2015   IBS (irritable bowel syndrome) 09/20/2013   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   H/O cervical spine surgery 12/29/2012   Vitamin D  deficiency 11/23/2009   History of mononucleosis 04/03/2009    Past Surgical History:  Procedure Laterality Date   ADENOIDECTOMY  05/2000   anterior cervical decompression  Left 12/12/2020   revision with removal of hardware and decompression  - done at [facility]   APPENDECTOMY  03/2013   CHOLECYSTECTOMY  09/2009   COLONOSCOPY WITH PROPOFOL  N/A 04/30/2021   Procedure: COLONOSCOPY WITH PROPOFOL ;  Surgeon: Selena Daily, MD;  Location: [facility] ENDOSCOPY;  Service: Gastroenterology;  Laterality: N/A;   SPINE SURGERY     TONSILLECTOMY  05/2000   TOOTH EXTRACTION  05/23/2023   TYMPANOSTOMY TUBE PLACEMENT      Family History  Problem Relation Age of Onset   Urticaria Mother    Depression Mother    Hypertension Mother        controlled   Allergic rhinitis Mother    Anxiety disorder Mother    Obesity Mother    Diabetes Father        controlled   Hypertension Father    Allergic rhinitis Father    Depression Father    Obesity Father    Depression Brother    Anxiety disorder Brother    Obesity Brother    Allergic rhinitis Maternal Grandmother    Miscarriages / Stillbirths Maternal Grandmother    Stroke Maternal Grandfather    Diabetes Paternal Grandmother    Cancer Maternal Uncle 61       colon cancer   Diabetes Maternal Uncle     Social History   Socioeconomic History   Marital status: Single    Spouse name: Not on file   Number of children: 0   Years of education: Not on file   Highest education level: Master's degree (e.g., MA, MS, MEng, MEd, MSW, MBA)   Occupational History   Occupation: retail     Comment: 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   Social Drivers of Health   Financial Resource Strain: Low Risk  (03/29/2024)   Overall Financial Resource Strain (CARDIA)  Difficulty of Paying Living Expenses: Not hard at all  Food Insecurity: No Food Insecurity (03/29/2024)   Hunger Vital Sign    Worried About Running Out of Food in the Last Year: Never true    Ran Out of Food in the Last Year: Never true  Transportation Needs: No Transportation Needs (03/29/2024)   PRAPARE - Administrator, Civil Service (Medical): No    Lack of Transportation (Non-Medical): No  Physical Activity: Insufficiently Active (03/29/2024)   Exercise Vital Sign    Days of Exercise per Week: 2 days    Minutes of Exercise per Session: 10 min  Stress: Stress Concern Present (03/29/2024)   [employer] of Occupational Health - Occupational Stress Questionnaire    Feeling of Stress : Very much  Social Connections: Socially Isolated (03/29/2024)   Social Connection and Isolation Panel [NHANES]    Frequency of Communication with Friends and Family: More than three times a week    Frequency of Social Gatherings with Friends and Family: More than three times a week    Attends Religious Services: Never    Database administrator or Organizations: No    Attends Banker Meetings: Never    Marital Status: Never married  Intimate Partner Violence: Not At Risk (03/29/2024)   Humiliation, Afraid, Rape, and Kick  questionnaire    Fear of Current or Ex-Partner: No    Emotionally Abused: No    Physically Abused: No    Sexually Abused: No     Current Outpatient Medications:    amitriptyline (ELAVIL) 25 MG tablet, Take 25 mg by mouth at bedtime. neurologist, Disp: , Rfl:    baclofen  (LIORESAL ) 10 MG tablet, Take 10 mg by mouth 3 (three) times daily., Disp: , Rfl:    budesonide -formoterol  (SYMBICORT ) 160-4.5 MCG/ACT inhaler, Inhale 2 puffs into the lungs 2 (two) times daily., Disp: 30.6 g, Rfl: 1   cholecalciferol (VITAMIN D3) 25 MCG (1000 UNIT) tablet, Take 1,000 Units by mouth daily., Disp: , Rfl:    clonazePAM  (KLONOPIN ) 0.5 MG tablet, Take 1 tablet (0.5 mg total) by mouth 2 (two) times daily., Disp: 60 tablet, Rfl: 4   cyanocobalamin (VITAMIN B12) 250 MCG tablet, Take by mouth., Disp: , Rfl:    Drospirenone (SLYND) 4 MG TABS, Take 4 mg by mouth daily., Disp: , Rfl:    EPINEPHrine  (EPIPEN  2-PAK) 0.3 mg/0.3 mL IJ SOAJ injection, Inject 0.3 mg into the muscle as needed for anaphylaxis., Disp: 0.3 mL, Rfl: 1   famotidine (PEPCID) 20 MG tablet, Take 20 mg by mouth 2 (two) times daily., Disp: , Rfl:    fluticasone (FLONASE) 50 MCG/ACT nasal spray, Place into both nostrils daily., Disp: , Rfl:    Galcanezumab -gnlm (EMGALITY ) 120 MG/ML SOAJ, Inject 1 each into the skin every 30 (thirty) days., Disp: , Rfl:    indomethacin (INDOCIN SR) 75 MG CR capsule, Take 75 mg by mouth 2 (two) times daily with a meal., Disp: , Rfl:    ipratropium-albuterol  (DUONEB) 0.5-2.5 (3) MG/3ML SOLN, Take 3 mLs by nebulization every 4 (four) hours as needed., Disp: 360 mL, Rfl: 2   LamoTRIgine  250 MG TB24 24 hour tablet, Take 1 tablet by mouth daily., Disp: , Rfl:    levocetirizine (XYZAL ) 5 MG tablet, Take 1 tablet (5 mg total) by mouth daily as needed for allergies (Can take an extra dose during flare ups.)., Disp: 180 tablet, Rfl: 1   loratadine  (CLARITIN ) 10 MG tablet, Take 1 tablet (10 mg  total) by mouth daily as needed for  allergies (Can take an extra dose during flare ups.)., Disp: 180 tablet, Rfl: 1   methylphenidate (METADATE CD) 10 MG CR capsule, Take by mouth., Disp: , Rfl:    methylphenidate (METADATE CD) 10 MG CR capsule, Take by mouth., Disp: , Rfl:    methylphenidate (RITALIN) 10 MG tablet, Take 10 mg by mouth 2 (two) times daily. Psychiatrist, Disp: , Rfl:    montelukast  (SINGULAIR ) 10 MG tablet, Take 1 tablet (10 mg total) by mouth every evening., Disp: 90 tablet, Rfl: 1   norethindrone  (AYGESTIN ) 5 MG tablet, Take 5 mg by mouth daily., Disp: , Rfl:    Olopatadine  HCl (PATADAY ) 0.2 % SOLN, Place 1 drop into both eyes 1 day or 1 dose., Disp: 7.5 mL, Rfl: 1   Olopatadine -Mometasone (RYALTRIS ) 665-25 MCG/ACT SUSP, Place 1 spray into the nose 2 (two) times daily., Disp: 29 g, Rfl: 5   ondansetron  (ZOFRAN -ODT) 8 MG disintegrating tablet, Take 1 tablet (8 mg total) by mouth every 8 (eight) hours as needed for nausea or vomiting., Disp: 20 tablet, Rfl: 6   propranolol  ER (INDERAL  LA) 160 MG SR capsule, Take 160 mg by mouth daily., Disp: , Rfl:    Rimegepant Sulfate (NURTEC) 75 MG TBDP, Take 1 tablet (75 mg total) by mouth as needed (take 1 tablet at onset of headache, max is 1 tablet in 24 hours)., Disp: 8 tablet, Rfl: 11   sertraline  (ZOLOFT ) 100 MG tablet, Take 100 mg by mouth daily., Disp: , Rfl:    Spacer/Aero-Holding Chambers DEVI, 1 Device by Does not apply route as directed., Disp: 1 each, Rfl: 1   tirzepatide  (ZEPBOUND ) 12.5 MG/0.5ML Pen, Inject 12.5 mg into the skin once a week., Disp: 6 mL, Rfl: 0   valACYclovir  (VALTREX ) 500 MG tablet, Take 1 tablet (500 mg total) by mouth daily. **TAKE 1 TABLET BY MOUTH 3 times daily during outbreaks ONLY **, Disp: 100 tablet, Rfl: 1  Allergies  Allergen Reactions   Amoxicillin-Pot Clavulanate Other (See Comments)    Mood swings Mood swings Mood swings   Apple Itching and Swelling    ONLY certain "FRESH FRUITS" cause throat itches & in spring, throat feels like  it will swell.  (apples, peaches, pears, plums, mango)   Prednisone Palpitations and Other (See Comments)    Severe mood swings and tachycardia   Tape Itching     ROS  Ten systems reviewed and is negative except as mentioned in HPI    Objective  Vitals:   03/29/24 0828  BP: 102/72  Pulse: 98  Resp: 16  SpO2: 95%  Weight: 280 lb 9.6 oz (127.3 kg)  Height: 5' 5.75" (1.67 m)    Body mass index is 45.64 kg/m.  Physical Exam  Constitutional: Patient appears well-developed and well-nourished. Obese No distress.  HENT: Head: Normocephalic and atraumatic. Ears: B TMs ok, no erythema or effusion; Nose: Nose normal. Mouth/Throat: Oropharynx is clear and moist. No oropharyngeal exudate.  Eyes: Conjunctivae and EOM are normal. Pupils are equal, round, and reactive to light. No scleral icterus.  Neck: Normal range of motion. Neck supple. No JVD present. No thyromegaly present.  Cardiovascular: Normal rate, regular rhythm and normal heart sounds.  No murmur heard. No BLE edema. Pulmonary/Chest: Effort normal and breath sounds normal. No respiratory distress. Abdominal: Soft. Bowel sounds are normal, no distension. There is no tenderness. no masses Breast: no lumps or masses, no nipple discharge or rashes FEMALE GENITALIA:  Not done  RECTAL: not done  Musculoskeletal: Normal range of motion, no joint effusions. No gross deformities Neurological: he is alert and oriented to person, place, and time. No cranial nerve deficit. Coordination, balance, strength, speech and gait are normal.  Skin: Skin is warm and dry. No rash noted. No erythema. Two moles on back monitored by dermatologist  Psychiatric: Patient has a normal mood and affect. behavior is normal. Judgment and thought content normal.     Assessment & Plan  1. Well adult exam (Primary)  - B12 and Folate Panel - CBC with Differential/Platelet - Comprehensive metabolic panel with GFR - Lipid panel - Hemoglobin A1c - VITAMIN  D 25 Hydroxy (Vit-D Deficiency, Fractures)  2. B12 deficiency  - B12 and Folate Panel  3. Vitamin D  deficiency  - VITAMIN D  25 Hydroxy (Vit-D Deficiency, Fractures)  4. Lipid screening  - Lipid panel  5. Diabetes mellitus screening  - Hemoglobin A1c  6. Long-term use of high-risk medication  - CBC with Differential/Platelet - Comprehensive metabolic panel with GFR   -USPSTF grade A and B recommendations reviewed with patient; age-appropriate recommendations, preventive care, screening tests, etc discussed and encouraged; healthy living encouraged; see AVS for patient education given to patient -Discussed importance of 150 minutes of physical activity weekly, eat two servings of fish weekly, eat one serving of tree nuts ( cashews, pistachios, pecans, almonds.Aaron Aas) every other day, eat 6 servings of fruit/vegetables daily and drink plenty of water and avoid sweet beverages.   -Reviewed Health Maintenance: Yes.

## 2024-03-30 ENCOUNTER — Ambulatory Visit: Payer: Self-pay | Admitting: Family Medicine

## 2024-03-30 LAB — HEMOGLOBIN A1C
Hgb A1c MFr Bld: 5.2 % (ref <5.7)
Mean Plasma Glucose: 103 mg/dL
eAG (mmol/L): 5.7 mmol/L

## 2024-03-30 LAB — CBC WITH DIFFERENTIAL/PLATELET
Absolute Lymphocytes: 1709 {cells}/uL (ref 850–3900)
Absolute Monocytes: 544 {cells}/uL (ref 200–950)
Basophils Absolute: 51 {cells}/uL (ref 0–200)
Basophils Relative: 0.6 %
Eosinophils Absolute: 145 {cells}/uL (ref 15–500)
Eosinophils Relative: 1.7 %
HCT: 42.2 % (ref 35.0–45.0)
Hemoglobin: 13.7 g/dL (ref 11.7–15.5)
MCH: 31.1 pg (ref 27.0–33.0)
MCHC: 32.5 g/dL (ref 32.0–36.0)
MCV: 95.7 fL (ref 80.0–100.0)
MPV: 10.9 fL (ref 7.5–12.5)
Monocytes Relative: 6.4 %
Neutro Abs: 6052 {cells}/uL (ref 1500–7800)
Neutrophils Relative %: 71.2 %
Platelets: 282 10*3/uL (ref 140–400)
RBC: 4.41 10*6/uL (ref 3.80–5.10)
RDW: 11.9 % (ref 11.0–15.0)
Total Lymphocyte: 20.1 %
WBC: 8.5 10*3/uL (ref 3.8–10.8)

## 2024-03-30 LAB — COMPREHENSIVE METABOLIC PANEL WITH GFR
AG Ratio: 1.8 (calc) (ref 1.0–2.5)
ALT: 13 U/L (ref 6–29)
AST: 15 U/L (ref 10–30)
Albumin: 4.4 g/dL (ref 3.6–5.1)
Alkaline phosphatase (APISO): 98 U/L (ref 31–125)
BUN: 8 mg/dL (ref 7–25)
CO2: 28 mmol/L (ref 20–32)
Calcium: 9.2 mg/dL (ref 8.6–10.2)
Chloride: 105 mmol/L (ref 98–110)
Creat: 0.96 mg/dL (ref 0.50–0.97)
Globulin: 2.5 g/dL (ref 1.9–3.7)
Glucose, Bld: 96 mg/dL (ref 65–99)
Potassium: 4.4 mmol/L (ref 3.5–5.3)
Sodium: 139 mmol/L (ref 135–146)
Total Bilirubin: 0.5 mg/dL (ref 0.2–1.2)
Total Protein: 6.9 g/dL (ref 6.1–8.1)
eGFR: 80 mL/min/{1.73_m2} (ref >=60)

## 2024-03-30 LAB — VITAMIN D 25 HYDROXY (VIT D DEFICIENCY, FRACTURES): Vit D, 25-Hydroxy: 35 ng/mL (ref 30–100)

## 2024-03-30 LAB — LIPID PANEL
Cholesterol: 165 mg/dL (ref <200)
HDL: 38 mg/dL — ABNORMAL LOW (ref >=50)
LDL Cholesterol (Calc): 110 mg/dL — ABNORMAL HIGH
Non-HDL Cholesterol (Calc): 127 mg/dL (ref <130)
Total CHOL/HDL Ratio: 4.3 (calc) (ref <5.0)
Triglycerides: 77 mg/dL (ref <150)

## 2024-03-30 LAB — B12 AND FOLATE PANEL
Folate: 3.4 ng/mL — ABNORMAL LOW
Vitamin B-12: 413 pg/mL (ref 200–1100)

## 2024-04-01 ENCOUNTER — Ambulatory Visit (INDEPENDENT_AMBULATORY_CARE_PROVIDER_SITE_OTHER): Payer: PRIVATE HEALTH INSURANCE

## 2024-04-01 DIAGNOSIS — J309 Allergic rhinitis, unspecified: Secondary | ICD-10-CM | POA: Diagnosis not present

## 2024-04-08 ENCOUNTER — Ambulatory Visit (INDEPENDENT_AMBULATORY_CARE_PROVIDER_SITE_OTHER): Payer: PRIVATE HEALTH INSURANCE

## 2024-04-08 DIAGNOSIS — J309 Allergic rhinitis, unspecified: Secondary | ICD-10-CM | POA: Diagnosis not present

## 2024-04-20 ENCOUNTER — Ambulatory Visit (INDEPENDENT_AMBULATORY_CARE_PROVIDER_SITE_OTHER): Payer: PRIVATE HEALTH INSURANCE

## 2024-04-20 DIAGNOSIS — J309 Allergic rhinitis, unspecified: Secondary | ICD-10-CM

## 2024-04-21 ENCOUNTER — Ambulatory Visit: Payer: PRIVATE HEALTH INSURANCE | Admitting: Podiatry

## 2024-04-23 ENCOUNTER — Encounter: Payer: Self-pay | Admitting: Podiatry

## 2024-04-23 ENCOUNTER — Ambulatory Visit (INDEPENDENT_AMBULATORY_CARE_PROVIDER_SITE_OTHER): Payer: PRIVATE HEALTH INSURANCE | Admitting: Podiatry

## 2024-04-23 DIAGNOSIS — L600 Ingrowing nail: Secondary | ICD-10-CM | POA: Diagnosis not present

## 2024-04-23 NOTE — Patient Instructions (Signed)

## 2024-04-23 NOTE — Progress Notes (Signed)
Subjective:  Patient ID: Tracey Perry, female    DOB: 11/23/1990,  MRN: [id number]  Tracey Perry presents to clinic today for:  Chief Complaint  Patient presents with   Nail Problem    Rm15/ left hallux ingrown medial border/red and swollen  Patient presenting for above complaint.  She has had redness and swelling to the left hallux medial border.  Of note she did have PNA to the lateral border of the left hallux.  May be having some recurrence here however it is not painful.  PCP is Sowles, Krichna, MD.  Allergies  Allergen Reactions   Amoxicillin-Pot Clavulanate Other (See Comments)    Mood swings Mood swings Mood swings   Apple Itching and Swelling    ONLY certain "FRESH FRUITS" cause throat itches & in spring, throat feels like it will swell.  (apples, peaches, pears, plums, mango)   Prednisone Palpitations and Other (See Comments)    Severe mood swings and tachycardia   Tape Itching    Review of Systems: Negative except as noted in the HPI.  Objective:  There were no vitals filed for this visit.  Tracey Perry is a pleasant 33 y.o. female in NAD. AAO x 3.  Vascular Examination: Capillary refill time is less than 3 seconds to toes bilateral. Palpable pedal pulses b/l LE. Digital hair present b/l. No pedal edema b/l. Skin temperature gradient WNL b/l. No varicosities b/l. No cyanosis or clubbing noted b/l.   Dermatological Examination: There is incurvation of the left medial nail border.  There is pain on palpation of the affected nail border.  Localized redness and edema present.  No significant drainage.  Neurological Examination: Protective sensation intact with Semmes-Weinstein 10 gram monofilament b/l LE. Vibratory sensation intact b/l LE.     Latest Ref Rng & Units 03/29/2024    9:04 AM  Hemoglobin A1C  Hemoglobin-A1c <5.7 % 5.2      Assessment/Plan: 1. Ingrown left big toenail     No orders of the defined types were placed in this  encounter.   Discussed patient's condition today.  After obtaining patient consent, the left hallux was anesthetized with a 50:50 mixture of 1% lidocaine  plain and 0.5% bupivacaine plain for a total of 3cc's administered.  Upon confirmation of anesthesia, a freer elevator was utilized to free the medial nail border from the nail bed.  The nail border was then avulsed proximal to the eponychium and removed in toto.  The area was inspected for any remaining spicules.  A chemical matrixectomy was performed with phenol and neutralized with isopropyl alcohol solution.  Antibiotic ointment and a DSD were applied, followed by a Coban dressing.  Patient tolerated the anesthetic and procedure well and will f/u in 2-3 weeks for recheck.  Patient given post-procedure instructions for daily 20-minute Epsom salt soaks, antibiotic ointment and daily use of Bandaids until toe starts to dry / form eschar.    Return in about 2 weeks (around 05/07/2024) for Nail Check.   Eve Hinders, DPM, AACFAS [facility]     [address] [place], [place] [address]                Office [phone]  Fax [phone])  [phone]   

## 2024-04-26 ENCOUNTER — Encounter: Payer: Self-pay | Admitting: Podiatry

## 2024-04-28 ENCOUNTER — Encounter (INDEPENDENT_AMBULATORY_CARE_PROVIDER_SITE_OTHER): Payer: Self-pay | Admitting: Otolaryngology

## 2024-04-28 ENCOUNTER — Ambulatory Visit (INDEPENDENT_AMBULATORY_CARE_PROVIDER_SITE_OTHER): Payer: PRIVATE HEALTH INSURANCE | Admitting: Otolaryngology

## 2024-04-28 VITALS — BP 116/80 | HR 95

## 2024-04-28 DIAGNOSIS — R0981 Nasal congestion: Secondary | ICD-10-CM | POA: Diagnosis not present

## 2024-04-28 DIAGNOSIS — R0982 Postnasal drip: Secondary | ICD-10-CM

## 2024-04-28 DIAGNOSIS — J3089 Other allergic rhinitis: Secondary | ICD-10-CM | POA: Diagnosis not present
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
## 2024-04-29 ENCOUNTER — Ambulatory Visit (INDEPENDENT_AMBULATORY_CARE_PROVIDER_SITE_OTHER): Payer: PRIVATE HEALTH INSURANCE

## 2024-04-29 DIAGNOSIS — J309 Allergic rhinitis, unspecified: Secondary | ICD-10-CM

## 2024-04-30 ENCOUNTER — Ambulatory Visit: Payer: PRIVATE HEALTH INSURANCE | Admitting: Podiatry

## 2024-05-04 ENCOUNTER — Other Ambulatory Visit: Payer: Self-pay | Admitting: Family Medicine

## 2024-05-04 DIAGNOSIS — J4530 Mild persistent asthma, uncomplicated: Secondary | ICD-10-CM

## 2024-05-06 ENCOUNTER — Ambulatory Visit (INDEPENDENT_AMBULATORY_CARE_PROVIDER_SITE_OTHER): Payer: Self-pay

## 2024-05-06 DIAGNOSIS — J309 Allergic rhinitis, unspecified: Secondary | ICD-10-CM

## 2024-05-07 ENCOUNTER — Ambulatory Visit (INDEPENDENT_AMBULATORY_CARE_PROVIDER_SITE_OTHER): Payer: PRIVATE HEALTH INSURANCE | Admitting: Podiatry

## 2024-05-07 ENCOUNTER — Encounter: Payer: Self-pay | Admitting: Podiatry

## 2024-05-07 DIAGNOSIS — L600 Ingrowing nail: Secondary | ICD-10-CM

## 2024-05-07 NOTE — Progress Notes (Signed)
       Subjective:  Patient ID: Tracey Perry, female    DOB: 1990/11/27,  MRN: [id number]  Chief Complaint  Patient presents with   Nail Problem    Rm 8 Nail check patient days she is doing well.    Maryann E Para presents to clinic today for f/u of PNA to the left hallux medial border.  She is doing very well with this.  Denies any pain.  Is not having any drainage at this point.  She does start to have some irritation to the left hallux lateral border.  PCP is Sowles, Krichna, MD.  Allergies  Allergen Reactions   Amoxicillin-Pot Clavulanate Other (See Comments)    Mood swings Mood swings Mood swings   Apple Itching and Swelling    ONLY certain FRESH FRUITS cause throat itches & in spring, throat feels like it will swell.  (apples, peaches, pears, plums, mango)   Prednisone Palpitations and Other (See Comments)    Severe mood swings and tachycardia   Tape Itching    Objective:  There were no vitals filed for this visit.  Vascular Examination: Capillary refill time is less than 3 seconds to toes bilateral. Palpable pedal pulses b/l LE. Digital hair present b/l. No pedal edema b/l. Skin temperature gradient WNL b/l. No varicosities b/l. No cyanosis or clubbing noted b/l.   Dermatological Examination: Upon inspection of the PNA site, there are no clinical signs of infection.  No purulence, no necrosis, no malodor present.  Minimal to no erythema present.  Eschar formed along nail margin.  Minimal to no pain on palpation of area.  There are some mild tenderness on palpation of the left hallux lateral border today without significant erythema or edema.  Assessment/Plan: 1. Ingrown left big toenail     No orders of the defined types were placed in this encounter.  Left hallux medial border PNA site is well-healed today. Having no problems with this.  She can begin to discontinue soaking and bandaging the toe.  Does report getting some irritation to the left hallux  lateral nail border.  She would like to hold off on treating this today.  Did advise that Epsom salt soaks and topical antibiotic ointment could be beneficial in the interim.  Follow-up as needed if this worsens.  Discussed signs and symptoms of infection.  Return if symptoms worsen or fail to improve.   Syris Brookens L. Lunda Salines, AACFAS [facility]     [address] [place], [place] [address]                Office [phone]  Fax [phone]

## 2024-05-13 ENCOUNTER — Ambulatory Visit: Payer: PRIVATE HEALTH INSURANCE

## 2024-05-13 DIAGNOSIS — J309 Allergic rhinitis, unspecified: Secondary | ICD-10-CM

## 2024-05-15 ENCOUNTER — Other Ambulatory Visit: Payer: Self-pay | Admitting: Family Medicine

## 2024-05-18 ENCOUNTER — Other Ambulatory Visit: Payer: Self-pay | Admitting: Family Medicine

## 2024-05-19 ENCOUNTER — Encounter: Payer: Self-pay | Admitting: Family Medicine

## 2024-05-20 ENCOUNTER — Ambulatory Visit (INDEPENDENT_AMBULATORY_CARE_PROVIDER_SITE_OTHER): Payer: PRIVATE HEALTH INSURANCE

## 2024-05-20 DIAGNOSIS — J309 Allergic rhinitis, unspecified: Secondary | ICD-10-CM | POA: Diagnosis not present

## 2024-05-20 MED ORDER — ZEPBOUND 12.5 MG/0.5ML ~~LOC~~ SOAJ
12.5000 mg | SUBCUTANEOUS | 0 refills | Status: DC
Start: 2024-05-20 — End: 2024-08-18

## 2024-05-27 ENCOUNTER — Ambulatory Visit: Payer: PRIVATE HEALTH INSURANCE

## 2024-05-27 DIAGNOSIS — J309 Allergic rhinitis, unspecified: Secondary | ICD-10-CM | POA: Diagnosis not present

## 2024-05-28 ENCOUNTER — Telehealth: Payer: Self-pay | Admitting: Pharmacy Technician

## 2024-05-28 ENCOUNTER — Other Ambulatory Visit (HOSPITAL_COMMUNITY): Payer: Self-pay

## 2024-05-28 ENCOUNTER — Encounter: Payer: Self-pay | Admitting: Family Medicine

## 2024-05-28 ENCOUNTER — Ambulatory Visit: Payer: PRIVATE HEALTH INSURANCE | Admitting: Family Medicine

## 2024-05-28 VITALS — BP 96/60 | HR 90 | Resp 16 | Ht 65.75 in | Wt 279.5 lb

## 2024-05-28 DIAGNOSIS — F988 Other specified behavioral and emotional disorders with onset usually occurring in childhood and adolescence: Secondary | ICD-10-CM | POA: Diagnosis not present

## 2024-05-28 DIAGNOSIS — G4733 Obstructive sleep apnea (adult) (pediatric): Secondary | ICD-10-CM | POA: Diagnosis not present

## 2024-05-28 DIAGNOSIS — E538 Deficiency of other specified B group vitamins: Secondary | ICD-10-CM

## 2024-05-28 DIAGNOSIS — E559 Vitamin D deficiency, unspecified: Secondary | ICD-10-CM

## 2024-05-28 DIAGNOSIS — G43809 Other migraine, not intractable, without status migrainosus: Secondary | ICD-10-CM

## 2024-05-28 DIAGNOSIS — J4540 Moderate persistent asthma, uncomplicated: Secondary | ICD-10-CM

## 2024-05-28 DIAGNOSIS — Z6841 Body Mass Index (BMI) 40.0 and over, adult: Secondary | ICD-10-CM

## 2024-05-28 MED ORDER — MONTELUKAST SODIUM 10 MG PO TABS: 10.0000 mg | ORAL_TABLET | Freq: Every evening | ORAL | 1 refills | Status: DC

## 2024-05-28 MED ORDER — ZAVZPRET 10 MG/ACT NA SOLN: 1.0000 | Freq: Every day | NASAL | 1 refills | Status: AC | PRN

## 2024-05-28 NOTE — Progress Notes (Signed)
 Name: Tracey Perry   MRN: [id number]    DOB: 15-Oct-1991   Date:05/28/2024       Progress Note  Subjective  Chief Complaint  Chief Complaint  Patient presents with   Medical Management of Chronic Issues   Discussed the use of AI scribe software for clinical note transcription with the patient, who gave verbal consent to proceed.  History of Present Illness Tracey Perry is a 33 year old female with recurrent migraine episodes who presents for a follow-up visit.  She experiences recurrent migraine episodes, now occurring twice a month with increased severity. Symptoms include significant physical incoordination, vomiting, and photophobia. She uses Emgality  for management, and Nurtec provides relief 20-25% of the time. She continues to take propranolol  and Elavil, and has discontinued indomethacin as the stabbing headaches have reduced in frequency and duration.  She has a history of folic acid deficiency with recent blood work showing low levels. She has not started folic acid supplementation. She has been off B12 supplements for a week. She takes vitamin D  regularly and reports improvement in her well-being.  She has been managing her weight with Zepbound  since September 2024, after previously using Wegovy . Her weight has decreased from 331 pounds in April 2023 to 279.5 pounds currently. She has made dietary changes, including increased protein intake and cravings for broccoli, but has not incorporated structured physical activity into her routine, facing challenges with motivation for exercise.  She has obstructive sleep apnea and uses a CPAP machine nightly. She also has moderate persistent asthma and allergies, managed with allergy shots, Symbicort , a rescue inhaler, Singulair , and Xyzal .  She has a history of bipolar disorder and attention deficit disorder, managed with psychiatric care and Ritalin. The combination of CPAP and Ritalin has significantly improved her daytime  somnolence. She is also on Valtrex  for herpes management.  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 She prefers grocery delivery from [place] and enjoys sushi from [employer].    Patient Active Problem List   Diagnosis Date Noted   Asthma, well controlled 12/04/2023   Perennial allergic rhinitis 12/04/2023   Seasonal allergic conjunctivitis 12/04/2023   Recurrent infections 12/04/2023   Allergic urticaria 12/04/2023   Intractable migraine with aura with status migrainosus 12/13/2022   B12 deficiency 11/26/2022   Moderate persistent allergic asthma without complication 06/07/2022   Morbid obesity with BMI of 50.0-59.9, adult (HCC) 05/08/2022   Episodic tension-type headache, not intractable 04/25/2021   Idiopathic stabbing headache 04/25/2021   Anxiety 12/29/2020   Cervical spondylosis with myelopathy and radiculopathy 12/11/2020   Cervical stenosis of spinal canal 12/11/2020   Chronic pain 12/06/2020   Cervical spinal stenosis 05/05/2020   Bulging of cervical intervertebral disc 05/05/2020   Cervical radiculopathy 05/05/2020   Bipolar 2 disorder (HCC) 12/15/2018   Fever blister 07/29/2016   Menorrhagia with irregular cycle 06/20/2016   OSA on CPAP 05/11/2016   Hypersomnia 12/25/2015   Cluster B personality disorder (HCC) 10/27/2015   H/O urinary tract infection 06/21/2015   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   Depression, major, recurrent, moderate (HCC) 06/21/2015   Gastroparesis 06/21/2015   Hx of cold sores 06/21/2015   Generalized anxiety disorder 05/31/2015   History of pyelonephritis 05/30/2015   Dysmenorrhea 05/30/2015   History of asthma 05/30/2015   Migraine without aura and responsive to treatment 05/30/2015   Allergic rhinitis, seasonal 05/30/2015   Vertigo 05/30/2015   IBS (irritable bowel syndrome) 09/20/2013  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   H/O cervical spine surgery 12/29/2012   Vitamin D   deficiency 11/23/2009   History of mononucleosis 04/03/2009    Past Surgical History:  Procedure Laterality Date   ADENOIDECTOMY  05/2000   anterior cervical decompression  Left 12/12/2020   revision with removal of hardware and decompression  - done at [facility]   APPENDECTOMY  03/2013   CHOLECYSTECTOMY  09/2009   COLONOSCOPY WITH PROPOFOL  N/A 04/30/2021   Procedure: COLONOSCOPY WITH PROPOFOL ;  Surgeon: Unk Corinn Skiff, MD;  Location: [facility] ENDOSCOPY;  Service: Gastroenterology;  Laterality: N/A;   SPINE SURGERY     TONSILLECTOMY  05/2000   TOOTH EXTRACTION  05/23/2023   TYMPANOSTOMY TUBE PLACEMENT      Family History  Problem Relation Age of Onset   Urticaria Mother    Depression Mother    Hypertension Mother        controlled   Allergic rhinitis Mother    Anxiety disorder Mother    Obesity Mother    Diabetes Father        controlled   Hypertension Father    Allergic rhinitis Father    Depression Father    Obesity Father    Depression Brother    Anxiety disorder Brother    Obesity Brother    Allergic rhinitis Maternal Grandmother    Miscarriages / Stillbirths Maternal Grandmother    Stroke Maternal Grandfather    Diabetes Paternal Grandmother    Cancer Maternal Uncle 55       colon cancer   Diabetes Maternal Uncle     Social History   Tobacco Use   Smoking status: Never   Smokeless tobacco: Never  Substance Use Topics   Alcohol use: Not Currently     Current Outpatient Medications:    amitriptyline (ELAVIL) 25 MG tablet, Take 25 mg by mouth at bedtime. neurologist, Disp: , Rfl:    baclofen  (LIORESAL ) 10 MG tablet, Take 10 mg by mouth 3 (three) times daily., Disp: , Rfl:    budesonide -formoterol  (SYMBICORT ) 160-4.5 MCG/ACT inhaler, Inhale 2 puffs into the lungs 2 (two) times daily., Disp: 30.6 g, Rfl: 1   cholecalciferol (VITAMIN D3) 25 MCG (1000 UNIT) tablet, Take 1,000 Units by mouth daily., Disp: , Rfl:    clonazePAM  (KLONOPIN ) 0.5 MG tablet, Take 1  tablet (0.5 mg total) by mouth 2 (two) times daily., Disp: 60 tablet, Rfl: 4   cyanocobalamin (VITAMIN B12) 250 MCG tablet, Take by mouth., Disp: , Rfl:    Drospirenone (SLYND) 4 MG TABS, Take 4 mg by mouth daily., Disp: , Rfl:    EPINEPHrine  (EPIPEN  2-PAK) 0.3 mg/0.3 mL IJ SOAJ injection, Inject 0.3 mg into the muscle as needed for anaphylaxis., Disp: 0.3 mL, Rfl: 1   famotidine (PEPCID) 20 MG tablet, Take 20 mg by mouth 2 (two) times daily., Disp: , Rfl:    fluticasone (FLONASE) 50 MCG/ACT nasal spray, Place into both nostrils daily., Disp: , Rfl:    Galcanezumab -gnlm (EMGALITY ) 120 MG/ML SOAJ, Inject 1 each into the skin every 30 (thirty) days., Disp: , Rfl:    ipratropium-albuterol  (DUONEB) 0.5-2.5 (3) MG/3ML SOLN, Take 3 mLs by nebulization every 4 (four) hours as needed., Disp: 360 mL, Rfl: 2   LamoTRIgine  250 MG TB24 24 hour tablet, Take 1 tablet by mouth daily., Disp: , Rfl:    levocetirizine (XYZAL ) 5 MG tablet, Take 1 tablet (5 mg total) by mouth daily as needed for allergies (Can take an extra dose during flare ups.)., Disp:  180 tablet, Rfl: 1   loratadine  (CLARITIN ) 10 MG tablet, Take 1 tablet (10 mg total) by mouth daily as needed for allergies (Can take an extra dose during flare ups.)., Disp: 180 tablet, Rfl: 1   methylphenidate (METADATE CD) 10 MG CR capsule, Take by mouth., Disp: , Rfl:    methylphenidate (METADATE CD) 10 MG CR capsule, Take by mouth., Disp: , Rfl:    methylphenidate (RITALIN) 10 MG tablet, Take 10 mg by mouth 2 (two) times daily. Psychiatrist, Disp: , Rfl:    norethindrone  (AYGESTIN ) 5 MG tablet, Take 5 mg by mouth daily., Disp: , Rfl:    Olopatadine  HCl (PATADAY ) 0.2 % SOLN, Place 1 drop into both eyes 1 day or 1 dose., Disp: 7.5 mL, Rfl: 1   Olopatadine -Mometasone (RYALTRIS ) 665-25 MCG/ACT SUSP, Place 1 spray into the nose 2 (two) times daily., Disp: 29 g, Rfl: 5   ondansetron  (ZOFRAN -ODT) 8 MG disintegrating tablet, Take 1 tablet (8 mg total) by mouth every 8  (eight) hours as needed for nausea or vomiting., Disp: 20 tablet, Rfl: 6   propranolol  ER (INDERAL  LA) 160 MG SR capsule, Take 160 mg by mouth daily., Disp: , Rfl:    Rimegepant Sulfate (NURTEC) 75 MG TBDP, Take 1 tablet (75 mg total) by mouth as needed (take 1 tablet at onset of headache, max is 1 tablet in 24 hours)., Disp: 8 tablet, Rfl: 11   sertraline  (ZOLOFT ) 100 MG tablet, Take 100 mg by mouth daily., Disp: , Rfl:    Spacer/Aero-Holding Chambers DEVI, 1 Device by Does not apply route as directed., Disp: 1 each, Rfl: 1   tirzepatide  (ZEPBOUND ) 12.5 MG/0.5ML Pen, Inject 12.5 mg into the skin once a week., Disp: 6 mL, Rfl: 0   valACYclovir  (VALTREX ) 500 MG tablet, Take 1 tablet (500 mg total) by mouth daily. **Take 1 tablet by mouth 3 times daily during outbreaks ONLY**, Disp: 100 tablet, Rfl: 1   montelukast  (SINGULAIR ) 10 MG tablet, Take 1 tablet (10 mg total) by mouth every evening., Disp: 90 tablet, Rfl: 1  Allergies  Allergen Reactions   Amoxicillin-Pot Clavulanate Other (See Comments)    Mood swings Mood swings Mood swings   Apple Itching and Swelling    ONLY certain FRESH FRUITS cause throat itches & in spring, throat feels like it will swell.  (apples, peaches, pears, plums, mango)   Prednisone Palpitations and Other (See Comments)    Severe mood swings and tachycardia   Tape Itching    I personally reviewed active problem list, medication list, allergies with the patient/caregiver today.   ROS  Ten systems reviewed and is negative except as mentioned in HPI    Objective Physical Exam  CONSTITUTIONAL: Patient appears well-developed and well-nourished.  No distress. HEENT: Head atraumatic, normocephalic, neck supple. CARDIOVASCULAR: Normal rate, regular rhythm and normal heart sounds.  No murmur heard. No BLE edema. PULMONARY: Effort normal and breath sounds normal. No respiratory distress. ABDOMINAL: There is no tenderness or distention. MUSCULOSKELETAL: Normal  gait. Without gross motor or sensory deficit. PSYCHIATRIC: Patient has a normal mood and affect. behavior is normal. Judgment and thought content normal.  Vitals:   05/28/24 0840  BP: 96/60  Pulse: 90  Resp: 16  SpO2: 97%  Weight: 279 lb 8 oz (126.8 kg)  Height: 5' 5.75 (1.67 m)    Body mass index is 45.46 kg/m.  Recent Results (from the past 2160 hours)  B12 and Folate Panel     Status: Abnormal   Collection  Time: 03/29/24  9:04 AM  Result Value Ref Range   Vitamin B-12 413 200 - 1,100 pg/mL   Folate 3.4 (L) ng/mL    Comment:                            Reference Range                            Low:           <3.4                            Borderline:    3.4-5.4                            Normal:        >5.4 .   CBC with Differential/Platelet     Status: None   Collection Time: 03/29/24  9:04 AM  Result Value Ref Range   WBC 8.5 3.8 - 10.8 Thousand/uL   RBC 4.41 3.80 - 5.10 Million/uL   Hemoglobin 13.7 11.7 - 15.5 g/dL   HCT 57.7 64.9 - 54.9 %   MCV 95.7 80.0 - 100.0 fL   MCH 31.1 27.0 - 33.0 pg   MCHC 32.5 32.0 - 36.0 g/dL    Comment: For adults, a slight decrease in the calculated MCHC value (in the range of 30 to 32 g/dL) is most likely not clinically significant; however, it should be interpreted with caution in correlation with other red cell parameters and the patient's clinical condition.    RDW 11.9 11.0 - 15.0 %   Platelets 282 140 - 400 Thousand/uL   MPV 10.9 7.5 - 12.5 fL   Neutro Abs 6,052 1,500 - 7,800 cells/uL   Absolute Lymphocytes 1,709 850 - 3,900 cells/uL   Absolute Monocytes 544 200 - 950 cells/uL   Eosinophils Absolute 145 15 - 500 cells/uL   Basophils Absolute 51 0 - 200 cells/uL   Neutrophils Relative % 71.2 %   Total Lymphocyte 20.1 %   Monocytes Relative 6.4 %   Eosinophils Relative 1.7 %   Basophils Relative 0.6 %  Comprehensive metabolic panel with GFR     Status: None   Collection Time: 03/29/24  9:04 AM  Result Value Ref Range    Glucose, Bld 96 65 - 99 mg/dL    Comment: .            Fasting reference interval .    BUN 8 7 - 25 mg/dL   Creat 9.03 9.49 - 9.02 mg/dL   eGFR 80 > OR = 60 fO/fpw/8.26f7   BUN/Creatinine Ratio SEE NOTE: 6 - 22 (calc)    Comment:    Not Reported: BUN and Creatinine are within    reference range. .    Sodium 139 135 - 146 mmol/L   Potassium 4.4 3.5 - 5.3 mmol/L   Chloride 105 98 - 110 mmol/L   CO2 28 20 - 32 mmol/L   Calcium 9.2 8.6 - 10.2 mg/dL   Total Protein 6.9 6.1 - 8.1 g/dL   Albumin 4.4 3.6 - 5.1 g/dL   Globulin 2.5 1.9 - 3.7 g/dL (calc)   AG Ratio 1.8 1.0 - 2.5 (calc)   Total Bilirubin 0.5 0.2 - 1.2 mg/dL   Alkaline phosphatase (APISO) 98 31 - 125 U/L  AST 15 10 - 30 U/L   ALT 13 6 - 29 U/L  Lipid panel     Status: Abnormal   Collection Time: 03/29/24  9:04 AM  Result Value Ref Range   Cholesterol 165 <200 mg/dL   HDL 38 (L) > OR = 50 mg/dL   Triglycerides 77 <849 mg/dL   LDL Cholesterol (Calc) 110 (H) mg/dL (calc)    Comment: Reference range: <100 . Desirable range <100 mg/dL for primary prevention;   <70 mg/dL for patients with CHD or diabetic patients  with > or = 2 CHD risk factors. SABRA LDL-C is now calculated using the Martin-Hopkins  calculation, which is a validated novel method providing  better accuracy than the Friedewald equation in the  estimation of LDL-C.  Gladis APPLETHWAITE et al. SANDREA. 7986;689(80): 2061-2068  (http://education.[facility].com/faq/FAQ164)    Total CHOL/HDL Ratio 4.3 <5.0 (calc)   Non-HDL Cholesterol (Calc) 127 <130 mg/dL (calc)    Comment: For patients with diabetes plus 1 major ASCVD risk  factor, treating to a non-HDL-C goal of <100 mg/dL  (LDL-C of <29 mg/dL) is considered a therapeutic  option.   Hemoglobin A1c     Status: None   Collection Time: 03/29/24  9:04 AM  Result Value Ref Range   Hgb A1c MFr Bld 5.2 <5.7 %    Comment: For the purpose of screening for the presence of diabetes: . <5.7%       Consistent with  the absence of diabetes 5.7-6.4%    Consistent with increased risk for diabetes             (prediabetes) > or =6.5%  Consistent with diabetes . This assay result is consistent with a decreased risk of diabetes. . Currently, no consensus exists regarding use of hemoglobin A1c for diagnosis of diabetes in children. . According to American Diabetes Association (ADA) guidelines, hemoglobin A1c <7.0% represents optimal control in non-pregnant diabetic patients. Different metrics may apply to specific patient populations.  Standards of Medical Care in Diabetes(ADA). .    Mean Plasma Glucose 103 mg/dL   eAG (mmol/L) 5.7 mmol/L  VITAMIN D  25 Hydroxy (Vit-D Deficiency, Fractures)     Status: None   Collection Time: 03/29/24  9:04 AM  Result Value Ref Range   Vit D, 25-Hydroxy 35 30 - 100 ng/mL    Comment: Vitamin D  Status         25-OH Vitamin D : . Deficiency:                    <20 ng/mL Insufficiency:             20 - 29 ng/mL Optimal:                 > or = 30 ng/mL . For 25-OH Vitamin D  testing on patients on  D2-supplementation and patients for whom quantitation  of D2 and D3 fractions is required, the [facility]ureD(TM) 25-OH VIT D, (D2,D3), LC/MS/MS is recommended: order  code 07111 (patients >32yrs). . See Note 1 . Note 1 . For additional information, please refer to  http://education.[facility].com/faq/FAQ199  (This link is being provided for informational/ educational purposes only.)       PHQ2/9:    05/28/2024    8:38 AM 03/29/2024    8:25 AM 02/17/2024    8:17 AM 09/24/2023    9:42 AM 08/04/2023    8:12 AM  Depression screen PHQ 2/9  Decreased Interest 2 1 1 1 1   Down, Depressed,  Hopeless 2 1 1 1 1   PHQ - 2 Score 4 2 2 2 2   Altered sleeping 2 1 0 1 3  Tired, decreased energy 2 1 0 1 3  Change in appetite 2 1 0 1 1  Feeling bad or failure about yourself  0 1 0 1 0  Trouble concentrating 2 1 1 1 1   Moving slowly or fidgety/restless 0 0 0 1 0  Suicidal  thoughts 0 0 0 0 0  PHQ-9 Score 12 7 3 8 10   Difficult doing work/chores Very difficult Somewhat difficult Somewhat difficult Not difficult at all Somewhat difficult    phq 9 is positive  Fall Risk:    05/28/2024    8:36 AM 03/29/2024    8:21 AM 11/14/2023    9:23 AM 10/20/2023    2:27 PM 09/24/2023    9:42 AM  Fall Risk   Falls in the past year? 0 0 0 0 0  Number falls in past yr: 0 0  0 0  Injury with Fall? 0 0  0 0  Risk for fall due to : No Fall Risks No Fall Risks No Fall Risks No Fall Risks No Fall Risks  Follow up Falls evaluation completed Falls prevention discussed;Education provided;Falls evaluation completed Falls prevention discussed Falls prevention discussed Falls prevention discussed     Assessment & Plan Migraine with vestibular symptoms Migraine frequency reduced with Emgality , but severity increased. Nurtec partially effective. Indomethacin discontinued. Episodes weather-related. - Prescribe Zepzpray for breakthrough episodes as needed. - Continue Emgality , propranolol , and Elavil. - Monitor response to Zepzpray and adjust treatment as necessary.  Obesity Weight decreased from 331 lbs to 279 lbs with Wegovy  and Zepbound . BMI improved. Weight loss due to medication, dietary changes, and possibly Ritalin. No significant physical activity yet. - Continue Zepbound  12.5 mg. - Encourage incorporation of physical activity, starting with small, manageable routines. - Discuss potential use of Peloton app for guided exercises and meditation.  Folic acid deficiency Folic acid level low. Importance of folic acid in preventing anemia and neural tube defects discussed. - Prescribe folic acid 1 gram daily. - Educate on importance of adherence to prevent anemia.  Vitamin B12 deficiency B12 level mid-range. Off supplements for a week. - Resume B12 supplementation.  Obstructive sleep apnea CPAP use nightly, improving daytime somnolence with Ritalin. - Continue CPAP use  nightly.  Asthma, moderate persistent, and allergic rhinitis Under specialist care with allergy shots and inhalers. Current medications include Symbicort  and rescue inhaler. - Continue current asthma and allergy management with Symbicort  and rescue inhaler. - Prescribe montelukast  for allergies and asthma.  Bipolar disorder Under psychiatric care, attending therapy, engaging in social activities, improved mood. - Continue current psychiatric management and therapy.  Attention deficit disorder Takes Ritalin, contributing to weight loss. Upcoming psychiatrist appointment for medication review. - Continue Ritalin as prescribed. - Follow up with psychiatrist for medication review.  Genital herpes simplex Requires Valtrex  for management. - Prescribe Valtrex  as needed for herpes management.

## 2024-05-28 NOTE — Telephone Encounter (Signed)
 Pharmacy Patient Advocate Encounter  Received notification from [facility] that Prior Authorization for Zavzpret  10MG /ACT solution has been APPROVED from 05/28/24 to 08/26/24   PA #/Case ID/Reference #: [id number]

## 2024-05-28 NOTE — Telephone Encounter (Signed)
 Pharmacy Patient Advocate Encounter   Received notification from CoverMyMeds that prior authorization for Zavzpret  10MG /ACT solution is required/requested.   Insurance verification completed.   The patient is insured through [employer] .   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 Test claim below:

## 2024-05-31 ENCOUNTER — Other Ambulatory Visit (HOSPITAL_COMMUNITY): Payer: Self-pay

## 2024-05-31 ENCOUNTER — Ambulatory Visit: Payer: PRIVATE HEALTH INSURANCE | Admitting: Family Medicine

## 2024-06-02 ENCOUNTER — Ambulatory Visit: Payer: PRIVATE HEALTH INSURANCE | Admitting: Family

## 2024-06-06 NOTE — Progress Notes (Unsigned)
 [address] [place] [address] Dept: [phone]  FOLLOW UP NOTE  Patient ID: Tracey Perry, female    DOB: [birth year]  Age: 33 y.o. MRN: [id number] Date of Office Visit: 06/07/2024  Assessment  Chief Complaint: Follow-up (No concerns)  HPI Tracey Perry is a 33 year old female who presents to the clinic for a follow-up visit.  She was last seen in this clinic on 12/04/2023 by Arlean Mutter, FNP, for evaluation of asthma, allergic rhinitis, allergic conjunctivitis, urticaria, and recurrent infection.  At today's visit, she reports her asthma has been well-controlled with no symptoms including shortness of breath, cough, or wheeze with activity or rest.  She continues montelukast  10 mg once a day and uses Symbicort  160 about 2-3 times a week.  She rarely uses albuterol  for relief of symptoms.    Allergic rhinitis is reported as moderately well-controlled with only minimal nasal symptoms occurring the spring.  She continues cetirizine  twice a day day with relief of symptoms.  Her last environmental allergy testing via lab was on an 06/2023 and was positive to tree pollen, grass pollen, cat, dog, dust mite, and weed pollens.  She began allergen immunotherapy on 09/24/2023.  EpiPen  set is up-to-date.  Allergic conjunctivitis is reported as moderately well-controlled with red and itchy eyes mainly occurring during the spring season.  She continues olopatadine  with moderate relief of symptoms.  She is interested in trying cromolyn for symptom control.  Urticaria is reported as moderately well-controlled with 1 episode occurring after she stopped taking famotidine.  At this time, she continues famotidine 20 mg once a day as well as cetirizine  20 mg daily with no recent urticarial breakouts.  She reports 1 sinus infection requiring antibiotics since her last visit to this clinic.  Immune screening from 12/04/2023 indicates normal IgG, IgA, IgM levels, protective titers to diphtheria and  tetanus, complement within normal limits, and 17 out of 23 protective pneumococcal serotypes.  Her current medications are listed in the chart.  Of note, she did see Dr. Soldatova at [facility] on 04/28/2024 for evaluation of recurrent nasal congestion with septal deviation noted on endoscopy.  She sought a second opinion and saw Dr. Garnette Coddington at [facility] on 05/18/2024.  At that time, he recommended endoscopic surgery if she experienced 4 or more infections requiring antibiotics in one year.  Allergies:  Allergies  Allergen Reactions   Amoxicillin-Pot Clavulanate Other (See Comments)    Mood swings Mood swings Mood swings   Apple Itching and Swelling    ONLY certain FRESH FRUITS cause throat itches & in spring, throat feels like it will swell.  (apples, peaches, pears, plums, mango)   Prednisone Palpitations and Other (See Comments)    Severe mood swings and tachycardia   Tape Itching    Physical Exam: BP 98/60   Pulse 86   Temp 98 F (36.7 C)   Resp 20   SpO2 99%    Physical Exam Vitals reviewed.  Constitutional:      Appearance: Normal appearance.  HENT:     Head: Normocephalic and atraumatic.     Right Ear: Tympanic membrane normal.     Left Ear: Tympanic membrane normal.     Nose:     Comments: Bilateral nares slightly erythematous with thin clear nasal drainage noted.  Pharynx normal.  Ears normal.  Eyes normal.    Mouth/Throat:     Pharynx: Oropharynx is clear.  Eyes:  Conjunctiva/sclera: Conjunctivae normal.  Cardiovascular:     Rate and Rhythm: Normal rate and regular rhythm.     Heart sounds: Normal heart sounds. No murmur heard. Pulmonary:     Effort: Pulmonary effort is normal.     Breath sounds: Normal breath sounds.     Comments: Lungs clear to auscultation Musculoskeletal:        General: Normal range of motion.     Cervical back: Normal range of motion and neck supple.   Skin:    General: Skin is warm and dry.  Neurological:     Mental Status: She is alert and oriented to person, place, and time.  Psychiatric:        Mood and Affect: Mood normal.        Behavior: Behavior normal.        Thought Content: Thought content normal.        Judgment: Judgment normal.     Assessment and Plan: 1. Asthma, mild intermittent, well-controlled   2. Seasonal and perennial allergic rhinitis   3. Seasonal allergic conjunctivitis   4. Idiopathic urticaria   5. Recurrent infections   6. Asthma, moderate persistent, well-controlled     Patient Instructions   1.  Continue allergen immunotherapy and have access to EpiPen   2. Treat and prevent inflammation of airway:   A. Symbicort  160 - 2 inhalations 1-2 times per day w/ spacer (empty lungs)  B. Ryaltris  - 1 spray each nostril 1-2 times per day (SP)  C. Montelukast  10 mg - 1 tablet 1 time per day  3. If needed:   A. Epi-pen, benadryl, MD/ER evaluation for allergic reaction  B. Xyzal  / Claritin  - 1-2 times per day  C. Pataday  - 1 drop each eye 1 time per day. Can try cromolyn up to 4 times a day instead  D. Symbicort  160 - 2 inhalations every 6 hours  4. Follow-up in 6 months  Return in about 6 months (around 12/08/2024), or if symptoms worsen or fail to improve.    Thank you for the opportunity to care for this patient.  Please do not hesitate to contact me with questions.  Arlean Mutter, FNP [facility] 

## 2024-06-06 NOTE — Patient Instructions (Incomplete)
  1.  Continue allergen immunotherapy and have access to EpiPen   2. Treat and prevent inflammation of airway:   A. Symbicort  160 - 2 inhalations 1-2 times per day w/ spacer (empty lungs)  B. Ryaltris  - 1 spray each nostril 1-2 times per day (SP)  C. Montelukast  10 mg - 1 tablet 1 time per day  3. If needed:   A. Epi-pen, benadryl, MD/ER evaluation for allergic reaction  B. Xyzal  / Claritin  - 1-2 times per day  C. Pataday  - 1 drop each eye 1 time per day. Can try cromolyn up to 4 times a day instead  D. Symbicort  160 - 2 inhalations every 6 hours  4. Follow-up in 6 months

## 2024-06-07 ENCOUNTER — Ambulatory Visit (INDEPENDENT_AMBULATORY_CARE_PROVIDER_SITE_OTHER): Payer: PRIVATE HEALTH INSURANCE | Admitting: Family Medicine

## 2024-06-07 ENCOUNTER — Other Ambulatory Visit: Payer: Self-pay

## 2024-06-07 ENCOUNTER — Encounter: Payer: Self-pay | Admitting: Family Medicine

## 2024-06-07 VITALS — BP 98/60 | HR 86 | Temp 98.0°F | Resp 20

## 2024-06-07 DIAGNOSIS — J3089 Other allergic rhinitis: Secondary | ICD-10-CM

## 2024-06-07 DIAGNOSIS — H1010 Acute atopic conjunctivitis, unspecified eye: Secondary | ICD-10-CM

## 2024-06-07 DIAGNOSIS — L501 Idiopathic urticaria: Secondary | ICD-10-CM | POA: Diagnosis not present

## 2024-06-07 DIAGNOSIS — H1013 Acute atopic conjunctivitis, bilateral: Secondary | ICD-10-CM

## 2024-06-07 DIAGNOSIS — J4540 Moderate persistent asthma, uncomplicated: Secondary | ICD-10-CM

## 2024-06-07 DIAGNOSIS — J4520 Mild intermittent asthma, uncomplicated: Secondary | ICD-10-CM

## 2024-06-07 DIAGNOSIS — B999 Unspecified infectious disease: Secondary | ICD-10-CM

## 2024-06-07 DIAGNOSIS — J302 Other seasonal allergic rhinitis: Secondary | ICD-10-CM

## 2024-06-15 ENCOUNTER — Ambulatory Visit (INDEPENDENT_AMBULATORY_CARE_PROVIDER_SITE_OTHER): Payer: PRIVATE HEALTH INSURANCE

## 2024-06-15 DIAGNOSIS — J309 Allergic rhinitis, unspecified: Secondary | ICD-10-CM | POA: Diagnosis not present

## 2024-06-17 DIAGNOSIS — J3089 Other allergic rhinitis: Secondary | ICD-10-CM | POA: Diagnosis not present

## 2024-06-17 NOTE — Progress Notes (Signed)
 VIALS MADE ON 06/17/24

## 2024-06-18 DIAGNOSIS — J3081 Allergic rhinitis due to animal (cat) (dog) hair and dander: Secondary | ICD-10-CM | POA: Diagnosis not present

## 2024-06-29 ENCOUNTER — Ambulatory Visit (INDEPENDENT_AMBULATORY_CARE_PROVIDER_SITE_OTHER): Payer: PRIVATE HEALTH INSURANCE

## 2024-06-29 DIAGNOSIS — J309 Allergic rhinitis, unspecified: Secondary | ICD-10-CM

## 2024-07-06 ENCOUNTER — Ambulatory Visit (INDEPENDENT_AMBULATORY_CARE_PROVIDER_SITE_OTHER): Payer: PRIVATE HEALTH INSURANCE

## 2024-07-06 DIAGNOSIS — J309 Allergic rhinitis, unspecified: Secondary | ICD-10-CM | POA: Diagnosis not present

## 2024-07-07 ENCOUNTER — Telehealth: Payer: Self-pay | Admitting: Podiatry

## 2024-07-07 NOTE — Telephone Encounter (Signed)
 Patient called office as she had received a call and message that she needed to reschedule her appointment for this Friday. Reason documented for needing to change in appointment notes was that patient had scheduled in new patient slot when established. Patient WAS scheduled correctly as the 7:30 slot on Dr.Semons schedule is for a established patient. Told patient if she had any other issues and if she was contacted in regards to this again to tell them that there was notation as to why she does not need to reschedule in her chart.

## 2024-07-09 ENCOUNTER — Encounter: Payer: Self-pay | Admitting: Podiatry

## 2024-07-09 ENCOUNTER — Ambulatory Visit (INDEPENDENT_AMBULATORY_CARE_PROVIDER_SITE_OTHER): Payer: PRIVATE HEALTH INSURANCE | Admitting: Podiatry

## 2024-07-09 VITALS — Ht 65.0 in | Wt 273.0 lb

## 2024-07-09 DIAGNOSIS — T6591XA Toxic effect of unspecified substance, accidental (unintentional), initial encounter: Secondary | ICD-10-CM | POA: Diagnosis not present

## 2024-07-09 DIAGNOSIS — L603 Nail dystrophy: Secondary | ICD-10-CM | POA: Diagnosis not present

## 2024-07-09 NOTE — Patient Instructions (Signed)
 You can combine 3 tablespoons of coconut oil and 10-15 drops of tea tree oil together and apply to the toenails once a day.

## 2024-07-09 NOTE — Progress Notes (Signed)
 Chief Complaint  Patient presents with   Nail Problem    Had surgery on an ingrown toenail L Great toe medial and now the rest of the nail is cracking and discolored. Non diabetic no anti coag    HPI: 33 y.o. female presents today concern for changes to the left first toenail.  Did have PNA procedure of the left first toe medial border on 6/6.  She reports that the area of the nail is brittle, growing loose and seems discolored though she did trim that area prior to presentation.  Reports some mild pain associated with this.  Denies signs of acute bacterial infection.  Past Medical History:  Diagnosis Date   Allergy    Anxiety    Asthma    Depression    Gastroparesis    Herpes simplex without complication    Morbid obesity due to excess calories (HCC)    PMDD (premenstrual dysphoric disorder)    Sleep apnea    Vertigo    Vitamin D  deficiency     Past Surgical History:  Procedure Laterality Date   ADENOIDECTOMY  05/2000   anterior cervical decompression  Left 12/12/2020   revision with removal of hardware and decompression  - done at [facility]   APPENDECTOMY  03/2013   CHOLECYSTECTOMY  09/2009   COLONOSCOPY WITH PROPOFOL  N/A 04/30/2021   Procedure: COLONOSCOPY WITH PROPOFOL ;  Surgeon: Unk Corinn Skiff, MD;  Location: [facility] ENDOSCOPY;  Service: Gastroenterology;  Laterality: N/A;   SPINE SURGERY     TONSILLECTOMY  05/2000   TOOTH EXTRACTION  05/23/2023   TYMPANOSTOMY TUBE PLACEMENT      Allergies  Allergen Reactions   Amoxicillin-Pot Clavulanate Other (See Comments)    Mood swings Mood swings Mood swings   Apple Itching and Swelling    ONLY certain FRESH FRUITS cause throat itches & in spring, throat feels like it will swell.  (apples, peaches, pears, plums, mango)   Prednisone Palpitations and Other (See Comments)    Severe mood swings and tachycardia   Tape Itching    ROS denies nausea, vomiting, fever, chills, chest pain, shortness of breath   Physical  Exam: There were no vitals filed for this visit.  General: The patient is alert and oriented x3 in no acute distress.  Dermatology: Skin is warm, dry and supple bilateral lower extremities. Interspaces are clear of maceration and debris.  Left first toenail dystrophic changes seen mostly along medial border.  The affected portion of the toenail is mostly trimmed back but does seem somewhat brittle in appearance.  No significant pain on palpation.  Some evidence of nail thickening proximally seen.  Vascular: Palpable pedal pulses bilaterally. Capillary refill within normal limits.  No appreciable edema.  No erythema or calor.  Neurological: Light touch sensation grossly intact bilateral feet.   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  None  Discussed clinical findings with patient today.  Findings of nail dystrophy discussed with patient.  Not currently painful.  Advised monitoring along with patient taking biotin supplementation.  Can also apply tea tree oil mixed with coconut oil or urea based nail product to keep nail from growing thin and encourage regular nail growth.  If it becomes more symptomatic, follow-up as needed, may need to consider total nail avulsion.   Raeford Brandenburg L. Lamount, DPM, AACFAS [facility]     [address] [place], [place] [address]                Office [phone]  Fax [phone]

## 2024-07-13 ENCOUNTER — Ambulatory Visit (INDEPENDENT_AMBULATORY_CARE_PROVIDER_SITE_OTHER): Payer: PRIVATE HEALTH INSURANCE

## 2024-07-13 DIAGNOSIS — J309 Allergic rhinitis, unspecified: Secondary | ICD-10-CM | POA: Diagnosis not present

## 2024-07-23 ENCOUNTER — Ambulatory Visit (INDEPENDENT_AMBULATORY_CARE_PROVIDER_SITE_OTHER): Payer: PRIVATE HEALTH INSURANCE

## 2024-07-23 ENCOUNTER — Ambulatory Visit: Payer: PRIVATE HEALTH INSURANCE | Admitting: Podiatry

## 2024-07-23 DIAGNOSIS — J309 Allergic rhinitis, unspecified: Secondary | ICD-10-CM | POA: Diagnosis not present

## 2024-08-09 ENCOUNTER — Telehealth: Payer: Self-pay | Admitting: Pharmacy Technician

## 2024-08-09 ENCOUNTER — Other Ambulatory Visit (HOSPITAL_COMMUNITY): Payer: Self-pay

## 2024-08-10 ENCOUNTER — Other Ambulatory Visit (HOSPITAL_COMMUNITY): Payer: Self-pay

## 2024-08-10 NOTE — Telephone Encounter (Signed)
 Pharmacy Patient Advocate Encounter   Received notification from CoverMyMeds that prior authorization for Zepbound  12.5MG /0.5ML pen-injectors is required/requested.   Insurance verification completed.   The patient is insured through [employer] .   Per test claim: Refill too soon. PA is not needed at this time. Medication was filled 07/25/24. Next eligible fill date is 08/15/24.

## 2024-08-16 ENCOUNTER — Other Ambulatory Visit (HOSPITAL_COMMUNITY): Payer: Self-pay

## 2024-08-17 ENCOUNTER — Other Ambulatory Visit (HOSPITAL_COMMUNITY): Payer: Self-pay

## 2024-08-18 ENCOUNTER — Other Ambulatory Visit (HOSPITAL_COMMUNITY): Payer: Self-pay

## 2024-08-18 ENCOUNTER — Encounter: Payer: Self-pay | Admitting: Family Medicine

## 2024-08-18 ENCOUNTER — Ambulatory Visit: Payer: PRIVATE HEALTH INSURANCE | Admitting: Family Medicine

## 2024-08-18 ENCOUNTER — Telehealth: Payer: Self-pay | Admitting: Pharmacy Technician

## 2024-08-18 DIAGNOSIS — G4733 Obstructive sleep apnea (adult) (pediatric): Secondary | ICD-10-CM

## 2024-08-18 DIAGNOSIS — J4540 Moderate persistent asthma, uncomplicated: Secondary | ICD-10-CM

## 2024-08-18 DIAGNOSIS — E538 Deficiency of other specified B group vitamins: Secondary | ICD-10-CM

## 2024-08-18 DIAGNOSIS — F3131 Bipolar disorder, current episode depressed, mild: Secondary | ICD-10-CM

## 2024-08-18 DIAGNOSIS — E559 Vitamin D deficiency, unspecified: Secondary | ICD-10-CM

## 2024-08-18 DIAGNOSIS — G43009 Migraine without aura, not intractable, without status migrainosus: Secondary | ICD-10-CM

## 2024-08-18 DIAGNOSIS — F988 Other specified behavioral and emotional disorders with onset usually occurring in childhood and adolescence: Secondary | ICD-10-CM | POA: Diagnosis not present

## 2024-08-18 MED ORDER — ZEPBOUND 12.5 MG/0.5ML ~~LOC~~ SOAJ: 12.5000 mg | SUBCUTANEOUS | 0 refills | Status: DC

## 2024-08-18 NOTE — Telephone Encounter (Signed)
 Pharmacy Patient Advocate Encounter   Received notification from Onbase that prior authorization for Zavzpret  10 mg nasal spray is required/requested.   Insurance verification completed.   The patient is insured through [employer].   Per test claim: PA required; PA submitted to above mentioned insurance via Fax Key/confirmation #/EOC [id number] Status is pending  PHONE# [phone] FAX# [phone]

## 2024-08-18 NOTE — Progress Notes (Signed)
 Name: Tracey Perry   MRN: [id number]    DOB: Mar 14, 1991   Date:08/18/2024       Progress Note  Subjective  Chief Complaint  Chief Complaint  Patient presents with   Medical Management of Chronic Issues   Discussed the use of AI scribe software for clinical note transcription with the patient, who gave verbal consent to proceed.  History of Present Illness Tracey Perry is a 33 year old female who presents for weight management follow-up.  She has been on Zepbound  for weight management, resulting in a weight loss of about 55 pounds. Previously, she was on Wegovy  but found it less effective. She is currently on a dose of 12.5 mg of Zepbound  and continues to lose weight, necessitating frequent changes in clothing size. She reports that her weight has decreased from 331 pounds in April 2023 to 258 pounds currently, and her BMI is now 43.05. She now weighs 258 pounds.  She engages in physical activity by lifting heavy objects at work, but does not enjoy going to the gym. She is exploring exercise options that do not require social interaction and is mindful of her budget constraints, noting that she is not food secure and follows a mostly vegetarian diet due to cost. Her grocery budget is about $100 a month, which includes non-food items.  She has a history of migraines and is on multiple medications for management, including propranolol , Nurtec, and Emgality . August was migraine-free, which she attributes to stable weather conditions, but September has been more challenging due to weather fluctuations. She takes Emgality  monthly and finds Nurtec effective for acute migraine relief.  She has moderate persistent allergic asthma, which has improved since receiving allergy shots, although she is sensitive to poor air quality. She is prescribed Symbicort  but often forgets to take it. She also takes Xyzal , Claritin , and Singulair  for allergy management. No wheezing, but she experiences  shortness of breath with poor air quality.  For her bipolar affective disorder, she is on lamotrigine , sertraline , and clonazepam  as needed. Her PHQ-9 score was 12 in July and is now 3. She sees a psychiatrist and a therapist regularly.  She uses a CPAP machine for obstructive sleep apnea and continues to use it as it helps her breathe deeply, especially after having COVID-19.  She takes several other medications, including metadate CD and Ritalin for energy, amitriptyline for migraines, and famotidine for allergies. She also supplements with B12, folic acid, and vitamin D  due to previous deficiencies.    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/25/2021   Anxiety 12/29/2020   Cervical spondylosis with myelopathy and radiculopathy 12/11/2020   Cervical stenosis of spinal canal 12/11/2020   Chronic pain 12/06/2020   Cervical spinal stenosis 05/05/2020   Bulging of cervical intervertebral disc 05/05/2020   Cervical radiculopathy 05/05/2020   Bipolar 2 disorder (HCC) 12/15/2018   Fever blister 07/29/2016   Menorrhagia with irregular cycle 06/20/2016   OSA on CPAP 05/11/2016   Hypersomnia 12/25/2015  Cluster B personality disorder (HCC) 10/27/2015   H/O urinary tract infection 06/21/2015   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   Depression, major, recurrent, moderate (HCC) 06/21/2015   Gastroparesis 06/21/2015   Hx of cold  sores 06/21/2015   Generalized anxiety disorder 05/31/2015   History of pyelonephritis 05/30/2015   Dysmenorrhea 05/30/2015   History of asthma 05/30/2015   Migraine without aura and responsive to treatment 05/30/2015   Allergic rhinitis, seasonal 05/30/2015   Vertigo 05/30/2015   IBS (irritable bowel syndrome) 09/20/2013   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   H/O cervical spine surgery 12/29/2012   Vitamin D  deficiency 11/23/2009   History of mononucleosis 04/03/2009    Past Surgical History:  Procedure Laterality Date   ADENOIDECTOMY  05/2000   anterior cervical decompression  Left 12/12/2020   revision with removal of hardware and decompression  - done at [facility]   APPENDECTOMY  03/2013   CHOLECYSTECTOMY  09/2009   COLONOSCOPY WITH PROPOFOL  N/A 04/30/2021   Procedure: COLONOSCOPY WITH PROPOFOL ;  Surgeon: Unk Corinn Skiff, MD;  Location: [facility];  Service: Gastroenterology;  Laterality: N/A;   SPINE SURGERY     TONSILLECTOMY  05/2000   TOOTH EXTRACTION  05/23/2023   TYMPANOSTOMY TUBE PLACEMENT      Family History  Problem Relation Age of Onset   Urticaria Mother    Depression Mother    Hypertension Mother        controlled   Allergic rhinitis Mother    Anxiety disorder Mother    Obesity Mother    Diabetes Father        controlled   Hypertension Father    Allergic rhinitis Father    Depression Father    Obesity Father    Depression Brother    Anxiety disorder Brother    Obesity Brother    Allergic rhinitis Maternal Grandmother    Miscarriages / Stillbirths Maternal Grandmother    Stroke Maternal Grandfather    Diabetes Paternal Grandmother    Cancer Maternal Uncle 55       colon cancer   Diabetes Maternal Uncle     Social History   Tobacco Use   Smoking status: Never   Smokeless tobacco: Never  Substance Use Topics   Alcohol use: Not Currently     Current Outpatient Medications:    amitriptyline (ELAVIL) 25 MG tablet, Take 25 mg by mouth at  bedtime., Disp: , Rfl:    baclofen  (LIORESAL ) 10 MG tablet, Take 10 mg by mouth 3 (three) times daily., Disp: , Rfl:    budesonide -formoterol  (SYMBICORT ) 160-4.5 MCG/ACT inhaler, Inhale 2 puffs into the lungs 2 (two) times daily., Disp: 30.6 g, Rfl: 1   cholecalciferol (VITAMIN D3) 25 MCG (1000 UNIT) tablet, Take 1,000 Units by mouth daily., Disp: , Rfl:    clonazePAM  (KLONOPIN ) 0.5 MG tablet, Take 1 tablet (0.5 mg total) by mouth 2 (two) times daily., Disp: 60 tablet, Rfl: 4   cyanocobalamin (VITAMIN B12) 250 MCG tablet, Take by mouth., Disp: , Rfl:    doxycycline  (VIBRAMYCIN ) 100 MG capsule, Take 100 mg by mouth., Disp: , Rfl:    EPINEPHrine  (EPIPEN  2-PAK) 0.3 mg/0.3 mL IJ SOAJ injection, Inject 0.3 mg into the muscle as needed for anaphylaxis., Disp: 0.3 mL, Rfl: 1   famotidine (PEPCID) 20 MG tablet, Take 20 mg by mouth 2 (two) times daily., Disp: , Rfl:    Galcanezumab -gnlm (EMGALITY ) 120 MG/ML SOAJ, Inject 1 each into the skin every 30 (thirty) days., Disp: , Rfl:  ipratropium-albuterol  (DUONEB) 0.5-2.5 (3) MG/3ML SOLN, Take 3 mLs by nebulization every 4 (four) hours as needed., Disp: 360 mL, Rfl: 2   LamoTRIgine  250 MG TB24 24 hour tablet, Take 1 tablet by mouth daily., Disp: , Rfl:    levocetirizine (XYZAL ) 5 MG tablet, Take 1 tablet (5 mg total) by mouth daily as needed for allergies (Can take an extra dose during flare ups.)., Disp: 180 tablet, Rfl: 1   loratadine  (CLARITIN ) 10 MG tablet, Take 1 tablet (10 mg total) by mouth daily as needed for allergies (Can take an extra dose during flare ups.)., Disp: 180 tablet, Rfl: 1   methylphenidate (METADATE CD) 20 MG CR capsule, Take 20 mg by mouth every morning., Disp: , Rfl:    methylphenidate (RITALIN) 20 MG tablet, Take 20 mg by mouth every evening., Disp: , Rfl:    montelukast  (SINGULAIR ) 10 MG tablet, Take 1 tablet (10 mg total) by mouth every evening., Disp: 90 tablet, Rfl: 1   norethindrone  (AYGESTIN ) 5 MG tablet, Take 5 mg by mouth  daily., Disp: , Rfl:    Olopatadine  HCl (PATADAY ) 0.2 % SOLN, Place 1 drop into both eyes 1 day or 1 dose., Disp: 7.5 mL, Rfl: 1   Olopatadine -Mometasone (RYALTRIS ) 665-25 MCG/ACT SUSP, Place 1 spray into the nose 2 (two) times daily., Disp: 29 g, Rfl: 5   ondansetron  (ZOFRAN -ODT) 8 MG disintegrating tablet, Take 1 tablet (8 mg total) by mouth every 8 (eight) hours as needed for nausea or vomiting., Disp: 20 tablet, Rfl: 6   propranolol  ER (INDERAL  LA) 160 MG SR capsule, Take 160 mg by mouth daily., Disp: , Rfl:    Rimegepant Sulfate (NURTEC) 75 MG TBDP, Take 1 tablet (75 mg total) by mouth as needed (take 1 tablet at onset of headache, max is 1 tablet in 24 hours)., Disp: 8 tablet, Rfl: 11   sertraline  (ZOLOFT ) 100 MG tablet, Take 100 mg by mouth daily., Disp: , Rfl:    Spacer/Aero-Holding Chambers DEVI, 1 Device by Does not apply route as directed., Disp: 1 each, Rfl: 1   valACYclovir  (VALTREX ) 500 MG tablet, Take 1 tablet (500 mg total) by mouth daily. **Take 1 tablet by mouth 3 times daily during outbreaks ONLY**, Disp: 100 tablet, Rfl: 1   Zavegepant HCl (ZAVZPRET ) 10 MG/ACT SOLN, Place 1 each into the nose daily as needed., Disp: 6 each, Rfl: 1   tirzepatide  (ZEPBOUND ) 12.5 MG/0.5ML Pen, Inject 12.5 mg into the skin once a week., Disp: 6 mL, Rfl: 0  Allergies  Allergen Reactions   Amoxicillin-Pot Clavulanate Other (See Comments)    Mood swings Mood swings Mood swings   Apple Itching and Swelling    ONLY certain FRESH FRUITS cause throat itches & in spring, throat feels like it will swell.  (apples, peaches, pears, plums, mango)   Prednisone Palpitations and Other (See Comments)    Severe mood swings and tachycardia   Tape Itching    I personally reviewed active problem list, medication list, allergies with the patient/caregiver today.   ROS  Ten systems reviewed and is negative except as mentioned in HPI    Objective Physical Exam  CONSTITUTIONAL: Patient appears  well-developed and well-nourished.  No distress. HEENT: Head atraumatic, normocephalic, neck supple. CARDIOVASCULAR: Normal rate, regular rhythm and normal heart sounds.  No murmur heard. No BLE edema. PULMONARY: Effort normal and breath sounds normal. No respiratory distress. MUSCULOSKELETAL: Normal gait. Without gross motor or sensory deficit. PSYCHIATRIC: Patient has a normal mood and affect. behavior  is normal. Judgment and thought content normal.  Vitals:   08/18/24 0833  BP: 106/68  Pulse: 88  Resp: 16  SpO2: 97%  Weight: 258 lb 11.2 oz (117.3 kg)  Height: 5' 5 (1.651 m)    Body mass index is 43.05 kg/m.   PHQ2/9:    08/18/2024    8:32 AM 05/28/2024    8:38 AM 03/29/2024    8:25 AM 02/17/2024    8:17 AM 09/24/2023    9:42 AM  Depression screen PHQ 2/9  Decreased Interest 1 2 1 1 1   Down, Depressed, Hopeless 1 2 1 1 1   PHQ - 2 Score 2 4 2 2 2   Altered sleeping 0 2 1 0 1  Tired, decreased energy 0 2 1 0 1  Change in appetite 0 2 1 0 1  Feeling bad or failure about yourself  0 0 1 0 1  Trouble concentrating 1 2 1 1 1   Moving slowly or fidgety/restless 0 0 0 0 1  Suicidal thoughts 0 0 0 0 0  PHQ-9 Score 3 12 7 3 8   Difficult doing work/chores Somewhat difficult Very difficult Somewhat difficult Somewhat difficult Not difficult at all    phq 9 is positive  Fall Risk:    08/18/2024    8:28 AM 05/28/2024    8:36 AM 03/29/2024    8:21 AM 11/14/2023    9:23 AM 10/20/2023    2:27 PM  Fall Risk   Falls in the past year? 0 0 0 0 0  Number falls in past yr: 0 0 0  0  Injury with Fall? 0 0 0  0  Risk for fall due to : No Fall Risks No Fall Risks No Fall Risks No Fall Risks No Fall Risks  Follow up Falls evaluation completed Falls evaluation completed Falls prevention discussed;Education provided;Falls evaluation completed Falls prevention discussed Falls prevention discussed     Assessment & Plan Morbid Obesity, BMI over 40  status post significant weight loss on  Zepbound  Significant weight loss of about 70 pounds with Zepbound . Loose skin and food insecurity affecting diet. - Continue Zepbound  therapy. - Encourage weight training exercises. - Discuss balanced vegetarian meals. - Advise on affordable clothing options.  Food insecurity impacting dietary choices Food insecurity leading to a primarily vegetarian diet due to cost constraints. - Encourage balanced vegetarian meals. - Discuss affordable grocery options and meal planning.  Bipolar affective disorder, currently mild depression Mild depression with improved PHQ-9 score. Managed with lamotrigine , sertraline , and clonazepam . - Continue lamotrigine , sertraline , and clonazepam  as needed. - Maintain regular follow-ups with psychiatric care provider at [facility].  Migraine with and without aura, on preventive and abortive therapy Increased migraines in September due to weather changes. Managed with Emgality , Nurtec, ZAVZpret , propranolol , and amitriptyline. - Continue Emgality  injections monthly. - Use Nurtec and ZAVZpret  as needed.  Moderate persistent allergic asthma Asthma well-managed but Symbicort  often forgotten. - Encourage consistent use of Symbicort . - Consider placing Symbicort  in a more accessible location.  Obstructive sleep apnea, on CPAP Effective CPAP use. Weight loss may require future titration. - Continue CPAP therapy. - Monitor for signs of excessive pressure.  Herpes labialis, on suppressive therapy Occasional outbreaks managed by increasing Valtrex  dose. - Continue Valtrex  for suppressive therapy. - Increase Valtrex  dose during outbreaks as needed.  Folic acid deficiency Previously low folic acid level, currently supplemented. - Continue folic acid supplementation. - Recheck folic acid levels at next visit.  Vitamin D  deficiency Previously low vitamin  D levels, managed with supplements. - Continue vitamin D  supplementation.

## 2024-08-19 ENCOUNTER — Other Ambulatory Visit (HOSPITAL_COMMUNITY): Payer: Self-pay

## 2024-08-19 NOTE — Telephone Encounter (Signed)
 Pharmacy Patient Advocate Encounter  Received notification from [employer] that Prior Authorization for Zavzpret  10mg  nasal spray has been DENIED.  Full denial letter will be uploaded to the media tab. See denial reason below.   PA #/Case ID/Reference #: [id number]

## 2024-08-24 ENCOUNTER — Ambulatory Visit: Payer: PRIVATE HEALTH INSURANCE

## 2024-08-24 DIAGNOSIS — J309 Allergic rhinitis, unspecified: Secondary | ICD-10-CM

## 2024-08-30 ENCOUNTER — Encounter: Payer: Self-pay | Admitting: Family Medicine

## 2024-08-30 NOTE — Telephone Encounter (Signed)
 Please double up on your antihistamine the day before the next injection, the day of injection, and the day after injection. You can use ice at the injection site and an over the counter pain reliever if needed. Let's repeat the same dose also. Thank you

## 2024-08-30 NOTE — Telephone Encounter (Signed)
 Any other symptoms like trouble breathing or GI issues? Can we please repeat this dose next visit? Thank you

## 2024-09-21 ENCOUNTER — Ambulatory Visit: Payer: PRIVATE HEALTH INSURANCE

## 2024-09-21 DIAGNOSIS — J309 Allergic rhinitis, unspecified: Secondary | ICD-10-CM

## 2024-10-21 ENCOUNTER — Ambulatory Visit: Payer: PRIVATE HEALTH INSURANCE

## 2024-10-21 DIAGNOSIS — J309 Allergic rhinitis, unspecified: Secondary | ICD-10-CM | POA: Diagnosis not present

## 2024-10-22 ENCOUNTER — Telehealth: Payer: Self-pay

## 2024-10-25 NOTE — Telephone Encounter (Signed)
 A user error has taken place: encounter opened in error, closed for administrative reasons.

## 2024-10-26 ENCOUNTER — Ambulatory Visit: Payer: PRIVATE HEALTH INSURANCE

## 2024-10-26 DIAGNOSIS — J309 Allergic rhinitis, unspecified: Secondary | ICD-10-CM

## 2024-11-02 ENCOUNTER — Ambulatory Visit: Payer: PRIVATE HEALTH INSURANCE

## 2024-11-02 ENCOUNTER — Ambulatory Visit: Payer: PRIVATE HEALTH INSURANCE | Admitting: Family Medicine

## 2024-11-02 DIAGNOSIS — J309 Allergic rhinitis, unspecified: Secondary | ICD-10-CM | POA: Diagnosis not present

## 2024-11-09 ENCOUNTER — Ambulatory Visit (INDEPENDENT_AMBULATORY_CARE_PROVIDER_SITE_OTHER): Payer: PRIVATE HEALTH INSURANCE | Admitting: Allergy and Immunology

## 2024-11-09 ENCOUNTER — Ambulatory Visit: Payer: PRIVATE HEALTH INSURANCE

## 2024-11-09 ENCOUNTER — Other Ambulatory Visit: Payer: Self-pay

## 2024-11-09 ENCOUNTER — Encounter: Payer: Self-pay | Admitting: Allergy and Immunology

## 2024-11-09 VITALS — BP 120/62 | HR 85 | Temp 98.2°F | Resp 16 | Wt 254.1 lb

## 2024-11-09 DIAGNOSIS — J4520 Mild intermittent asthma, uncomplicated: Secondary | ICD-10-CM

## 2024-11-09 DIAGNOSIS — T7819XD Other adverse food reactions, not elsewhere classified, subsequent encounter: Secondary | ICD-10-CM | POA: Diagnosis not present

## 2024-11-09 DIAGNOSIS — J3089 Other allergic rhinitis: Secondary | ICD-10-CM | POA: Diagnosis not present

## 2024-11-09 DIAGNOSIS — J309 Allergic rhinitis, unspecified: Secondary | ICD-10-CM | POA: Diagnosis not present

## 2024-11-09 DIAGNOSIS — J301 Allergic rhinitis due to pollen: Secondary | ICD-10-CM

## 2024-11-09 DIAGNOSIS — J4540 Moderate persistent asthma, uncomplicated: Secondary | ICD-10-CM

## 2024-11-09 DIAGNOSIS — H1010 Acute atopic conjunctivitis, unspecified eye: Secondary | ICD-10-CM | POA: Diagnosis not present

## 2024-11-09 MED ORDER — MONTELUKAST SODIUM 10 MG PO TABS: 10.0000 mg | ORAL_TABLET | Freq: Every evening | ORAL | 1 refills | Status: AC

## 2024-11-09 MED ORDER — AIRSUPRA 90-80 MCG/ACT IN AERO: 2.0000 | INHALATION_SPRAY | Freq: Four times a day (QID) | RESPIRATORY_TRACT | 2 refills | Status: AC | PRN

## 2024-11-09 MED ORDER — RYALTRIS 665-25 MCG/ACT NA SUSP: 1.0000 | Freq: Two times a day (BID) | NASAL | 5 refills | Status: AC

## 2024-11-09 MED ORDER — OLOPATADINE HCL 0.2 % OP SOLN: 1.0000 [drp] | OPHTHALMIC | 1 refills | Status: AC

## 2024-11-09 MED ORDER — LEVOCETIRIZINE DIHYDROCHLORIDE 5 MG PO TABS: 5.0000 mg | ORAL_TABLET | Freq: Every day | ORAL | 1 refills | Status: AC | PRN

## 2024-11-09 MED ORDER — EPINEPHRINE 0.3 MG/0.3ML IJ SOAJ: 0.3000 mg | INTRAMUSCULAR | 1 refills | Status: AC | PRN

## 2024-11-09 NOTE — Patient Instructions (Addendum)
" ° °  1.  Continue allergen immunotherapy - dust mite, cat, dog, tree, grass, weed  2. Treat and prevent inflammation of airway:   A. Ryaltris  - 1 spray each nostril 1-2 times per day   B. Montelukast  10 mg - 1 tablet 1 time per day  3. If needed:   A. Epi-pen, benadryl, MD/ER evaluation for allergic reaction  B. Xyzal  5 / Claritin  10 / famotidine 20 - in evening  C. Pataday  - 1 drop each eye 1 time per day  D. Airsupra  - 2 inhalations every 6 hours  4.  Influenza = Tamiflu. Covid = Paxlovid  5.  Referral to [facility] at [facility] "

## 2024-11-09 NOTE — Progress Notes (Signed)
 "  [place] - [place] - [place] - [place] - [place]   Follow-up Note  Referring Provider: Sowles, Krichna, MD Primary Provider: Sowles, Krichna, MD Date of Office Visit: 11/09/2024  Subjective:   Tracey Perry (DOB: [birth year]) is a 33 y.o. female who [facility] in re-evaluation of the following:  HPI: Kashena returns to this clinic in evaluation of allergic rhinitis and asthma and oral allergy syndrome and history of urticaria.  I last saw her in this clinic 26 August 2023.  She saw our nurse practitioner on 07 June 2024.  She has done very well with her asthma and rarely uses a short acting bronchodilator and can exercise without any difficulty and no longer uses any controller agents and has not required a systemic steroid to treat an exacerbation.  And she has had very little problems with her upper airway while she continues to use her Ryaltris  and montelukast  and has not required an antibiotic to treat an episode of sinusitis.  She continues on immunotherapy currently at every 4 weeks.  She has completed her first year of immunotherapy.  She does develop large local reactions with her immunotherapy and she has had to cut down her dose and she is slowly building up once again.  She does use a H1 and H2 receptor blocker in the evening and she will take some Benadryl prior to her immunotherapy.  She can now eat fresh fruits and vegetables with no problem.  She has received a flu vaccine, COVID-vaccine, and some type of pneumonia vaccine.  She has new insurance as of 2026 and will no longer be able to see us .  This is a exclusive [facility] insurance product and we need to refer her to the [facility] allergy and immunology department.  Allergies as of 11/09/2024       Reactions   Amoxicillin-pot Clavulanate Other (See Comments)   Mood swings Mood swings Mood swings   Apple Itching, Swelling   ONLY certain FRESH FRUITS cause  throat itches & in spring, throat feels like it will swell.  (apples, peaches, pears, plums, mango)   Prednisone Palpitations, Other (See Comments)   Severe mood swings and tachycardia   Tape Itching        Medication List    amitriptyline 25 MG tablet Commonly known as: ELAVIL Take 25 mg by mouth at bedtime.   baclofen  10 MG tablet Commonly known as: LIORESAL  Take 10 mg by mouth 3 (three) times daily.   cholecalciferol 25 MCG (1000 UNIT) tablet Commonly known as: VITAMIN D3 Take 1,000 Units by mouth daily.   clonazePAM  0.5 MG tablet Commonly known as: KlonoPIN  Take 1 tablet (0.5 mg total) by mouth 2 (two) times daily.   cyanocobalamin 250 MCG tablet Commonly known as: VITAMIN B12 Take by mouth.   Emgality  120 MG/ML Soaj Generic drug: Galcanezumab -gnlm Inject 1 each into the skin every 30 (thirty) days.   EPINEPHrine  0.3 mg/0.3 mL Soaj injection Commonly known as: EpiPen  2-Pak Inject 0.3 mg into the muscle as needed for anaphylaxis.   famotidine 20 MG tablet Commonly known as: PEPCID Take 20 mg by mouth 2 (two) times daily.   LamoTRIgine  250 MG Tb24 24 hour tablet Take 1 tablet by mouth daily.   LamoTRIgine  300 MG Tb24 24 hour tablet Take 1 tablet by mouth daily.   levocetirizine 5 MG tablet Commonly known as: XYZAL  Take 1 tablet (5 mg total) by mouth daily as needed for allergies (  Can take an extra dose during flare ups.).   loratadine  10 MG tablet Commonly known as: CLARITIN  Take 1 tablet (10 mg total) by mouth daily as needed for allergies (Can take an extra dose during flare ups.).   methylphenidate 20 MG CR capsule Commonly known as: METADATE CD Take 20 mg by mouth every morning.   methylphenidate 20 MG tablet Commonly known as: RITALIN Take 20 mg by mouth every evening.   montelukast  10 MG tablet Commonly known as: SINGULAIR  Take 1 tablet (10 mg total) by mouth every evening.   norethindrone  5 MG tablet Commonly known as: AYGESTIN  Take 5 mg  by mouth daily.   Nurtec 75 MG Tbdp Generic drug: Rimegepant Sulfate Take 1 tablet (75 mg total) by mouth as needed (take 1 tablet at onset of headache, max is 1 tablet in 24 hours).   Olopatadine  HCl 0.2 % Soln Commonly known as: Pataday  Place 1 drop into both eyes 1 day or 1 dose.   ondansetron  8 MG disintegrating tablet Commonly known as: ZOFRAN -ODT Take 1 tablet (8 mg total) by mouth every 8 (eight) hours as needed for nausea or vomiting.   propranolol  ER 160 MG SR capsule Commonly known as: INDERAL  LA Take 160 mg by mouth daily.   Ryaltris  665-25 MCG/ACT Susp Generic drug: Olopatadine -Mometasone Place 1 spray into the nose 2 (two) times daily.   sertraline  100 MG tablet Commonly known as: ZOLOFT  Take 100 mg by mouth daily.   valACYclovir  500 MG tablet Commonly known as: VALTREX  Take 1 tablet (500 mg total) by mouth daily. **Take 1 tablet by mouth 3 times daily during outbreaks ONLY**   venlafaxine XR 75 MG 24 hr capsule Commonly known as: EFFEXOR-XR Take 75 mg by mouth.   Zavzpret  10 MG/ACT Soln Generic drug: Zavegepant HCl Place 1 each into the nose daily as needed.   Zepbound  12.5 MG/0.5ML Pen Generic drug: tirzepatide  Inject 12.5 mg into the skin once a week.    Past Medical History:  Diagnosis Date   Allergy    Anxiety    Asthma    Depression    Gastroparesis    Herpes simplex without complication    Morbid obesity due to excess calories (HCC)    PMDD (premenstrual dysphoric disorder)    Sleep apnea    Vertigo    Vitamin D  deficiency     Past Surgical History:  Procedure Laterality Date   ADENOIDECTOMY  05/2000   anterior cervical decompression  Left 12/12/2020   revision with removal of hardware and decompression  - done at [facility]   APPENDECTOMY  03/2013   CHOLECYSTECTOMY  09/2009   COLONOSCOPY WITH PROPOFOL  N/A 04/30/2021   Procedure: COLONOSCOPY WITH PROPOFOL ;  Surgeon: Unk Corinn Skiff, MD;  Location: [facility] ENDOSCOPY;  Service:  Gastroenterology;  Laterality: N/A;   SPINE SURGERY     TONSILLECTOMY  05/2000   TOOTH EXTRACTION  05/23/2023   TYMPANOSTOMY TUBE PLACEMENT      Review of systems negative except as noted in HPI / PMHx or noted below:  Review of Systems  Constitutional: Negative.   HENT: Negative.    Eyes: Negative.   Respiratory: Negative.    Cardiovascular: Negative.   Gastrointestinal: Negative.   Genitourinary: Negative.   Musculoskeletal: Negative.   Skin: Negative.   Neurological: Negative.   Endo/Heme/Allergies: Negative.   Psychiatric/Behavioral: Negative.       Objective:   Vitals:   11/09/24 1547  BP: 120/62  Pulse: 85  Resp: 16  Temp:  98.2 F (36.8 C)  SpO2: 99%      Weight: 254 lb 1.6 oz (115.3 kg)   Physical Exam Constitutional:      Appearance: She is not diaphoretic.  HENT:     Head: Normocephalic.     Right Ear: Tympanic membrane, ear canal and external ear normal.     Left Ear: Tympanic membrane, ear canal and external ear normal.     Nose: Nose normal. No mucosal edema or rhinorrhea.     Mouth/Throat:     Pharynx: Uvula midline. No oropharyngeal exudate.  Eyes:     Conjunctiva/sclera: Conjunctivae normal.  Neck:     Thyroid : No thyromegaly.     Trachea: Trachea normal. No tracheal tenderness or tracheal deviation.  Cardiovascular:     Rate and Rhythm: Normal rate and regular rhythm.     Heart sounds: Normal heart sounds, S1 normal and S2 normal. No murmur heard. Pulmonary:     Effort: No respiratory distress.     Breath sounds: Normal breath sounds. No stridor. No wheezing or rales.  Lymphadenopathy:     Head:     Right side of head: No tonsillar adenopathy.     Left side of head: No tonsillar adenopathy.     Cervical: No cervical adenopathy.  Skin:    Findings: No erythema or rash.     Nails: There is no clubbing.  Neurological:     Mental Status: She is alert.     Diagnostics: Spirometry was performed and demonstrated an FEV1 of 3.23 at 100  % of predicted.  Assessment and Plan:   1. Asthma, mild intermittent, well-controlled   2. Perennial allergic rhinitis   3. Seasonal allergic rhinitis due to pollen   4. Seasonal allergic conjunctivitis   5. Pollen-food allergy, subsequent encounter    1.  Continue allergen immunotherapy - dust mite, cat, dog, tree, grass, weed  2. Treat and prevent inflammation of airway:   A. Ryaltris  - 1 spray each nostril 1-2 times per day   B. Montelukast  10 mg - 1 tablet 1 time per day  3. If needed:   A. Epi-pen, benadryl, MD/ER evaluation for allergic reaction  B. Xyzal  5 / Claritin  10 / famotidine 20 - in evening  C. Pataday  - 1 drop each eye 1 time per day  D. Airsupra  - 2 inhalations every 6 hours  4.  Influenza = Tamiflu. Covid = Paxlovid  5.  Referral to [facility] at [facility] appears to be doing pretty well at this point in time while using immunotherapy and combination nasal spray and a leukotriene modifier to control her multiorgan atopic disease.  Immunotherapy is obviously giving her great benefit as she can go through the seasons of the year without any problem and she can now eat fresh fruit and vegetables.  She should continue on this form of therapy for 5 years and she has completed her first year.  She will need to receive this [facility] an [facility] office as her insurance is now [facility] exclusive.  We will refer her onto the [facility] group in [facility].  There is a selection of other agents that could be utilized should they be required as noted above.  We have given her an anti-inflammatory rescue medicine to use should it be required for her asthma.  Camellia Denis, MD Allergy / Immunology [facility] "

## 2024-11-15 ENCOUNTER — Ambulatory Visit: Payer: PRIVATE HEALTH INSURANCE | Admitting: Family Medicine

## 2024-11-15 ENCOUNTER — Encounter: Payer: Self-pay | Admitting: Allergy and Immunology

## 2024-11-15 ENCOUNTER — Encounter: Payer: Self-pay | Admitting: Family Medicine

## 2024-11-15 DIAGNOSIS — G4733 Obstructive sleep apnea (adult) (pediatric): Secondary | ICD-10-CM

## 2024-11-15 DIAGNOSIS — J4540 Moderate persistent asthma, uncomplicated: Secondary | ICD-10-CM | POA: Diagnosis not present

## 2024-11-15 DIAGNOSIS — J4530 Mild persistent asthma, uncomplicated: Secondary | ICD-10-CM | POA: Diagnosis not present

## 2024-11-15 DIAGNOSIS — E559 Vitamin D deficiency, unspecified: Secondary | ICD-10-CM | POA: Diagnosis not present

## 2024-11-15 DIAGNOSIS — F988 Other specified behavioral and emotional disorders with onset usually occurring in childhood and adolescence: Secondary | ICD-10-CM

## 2024-11-15 DIAGNOSIS — E538 Deficiency of other specified B group vitamins: Secondary | ICD-10-CM | POA: Diagnosis not present

## 2024-11-15 DIAGNOSIS — F3131 Bipolar disorder, current episode depressed, mild: Secondary | ICD-10-CM | POA: Diagnosis not present

## 2024-11-15 DIAGNOSIS — B009 Herpesviral infection, unspecified: Secondary | ICD-10-CM | POA: Diagnosis not present

## 2024-11-15 MED ORDER — ZEPBOUND 12.5 MG/0.5ML ~~LOC~~ SOAJ: 12.5000 mg | SUBCUTANEOUS | 0 refills | Status: AC

## 2024-11-15 MED ORDER — VALACYCLOVIR HCL 500 MG PO TABS: 500.0000 mg | ORAL_TABLET | Freq: Two times a day (BID) | ORAL | 1 refills | Status: AC

## 2024-11-15 NOTE — Progress Notes (Signed)
 Name: Tracey Perry   MRN: [id number]    DOB: [birth year]   Date:11/15/2024       Progress Note  Subjective  Chief Complaint  Chief Complaint  Patient presents with   Medical Management of Chronic Issues   Discussed the use of AI scribe software for clinical note transcription with the patient, who gave verbal consent to proceed.  History of Present Illness Tracey Perry is a 33 year old female who presents for a three-month follow-up visit.  BP is towards low end of normal but she denies dizziness upon standing  and acknowledges inadequate water intake. She is attempting to increase her fluid intake with one-liter bottles of no-sugar, no-sodium sparkling water daily, but finds this insufficient. She prefers to use electrolyte solutions sparingly due to their chalky taste.  She has been taking Zepbound  for weight loss, reducing her weight from 258 pounds on October 1st to 250 pounds currently, with a total weight loss of 63 pounds since starting Zepbound  and Wegovy  back in March of 2023 . Her clothing size has decreased from 26 to 20, and she anticipates reaching size 18 by March. She is trying to add strength training and is mindful of her diet, eating a lot of appels  She experiences recurrent maxillary sinusitis and epistaxis, with a history of a deviated septum. She reports having sinus infections at least four times a year. She uses saline spray frequently. She is not ready to have surgery since she will need to take too much time off work and cannot afford it   She suffers from migraines, experiencing episodes once to twice a month, each lasting one to two days. She is currently on Emgality , propranolol , Nurtec, and Elavil for migraine management. She also uses Zavzpret  as needed.  Her moderate persistent allergic asthma is managed with Symbicort , which she often forgets to take unless experiencing a flare. She is on Xyzal , Singulair , and loratadine  for allergies and receives  allergy shots, which have significantly improved her symptoms. She reports no longer being allergic to apples, which she can now consume without issues.  She has bipolar affective disorder and is currently on Effexor 75 mg, having switched from sertraline . She reports no side effects and feels better on Effexor. She also takes lamotrigine  300 mg and clonazepam . Her anxiety is more prominent than depression, with her PHQ-9 score at 6.  She has ADD and takes two forms of stimulants: extended-release in the morning and another dose at lunch or in the afternoon. She is also prescribed Airsupra  for asthma, which she has not yet started using.    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/25/2021   Anxiety 12/29/2020   Cervical spondylosis with myelopathy and radiculopathy 12/11/2020   Cervical stenosis of spinal canal 12/11/2020   Chronic pain 12/06/2020   Cervical spinal stenosis 05/05/2020   Bulging of cervical intervertebral disc 05/05/2020   Cervical radiculopathy 05/05/2020   Bipolar 2 disorder (HCC) 12/15/2018   Fever blister 07/29/2016  Menorrhagia with irregular cycle 06/20/2016   OSA on CPAP 05/11/2016   Hypersomnia 12/25/2015   Cluster B personality disorder (HCC) 10/27/2015   H/O urinary tract infection 06/21/2015   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   Depression, major, recurrent, moderate (HCC) 06/21/2015    Gastroparesis 06/21/2015   Hx of cold sores 06/21/2015   Generalized anxiety disorder 05/31/2015   History of pyelonephritis 05/30/2015   Dysmenorrhea 05/30/2015   History of asthma 05/30/2015   Migraine without aura and responsive to treatment 05/30/2015   Allergic rhinitis, seasonal 05/30/2015   Vertigo 05/30/2015   IBS (irritable bowel syndrome) 09/20/2013   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   H/O cervical spine surgery 12/29/2012   Vitamin D  deficiency 11/23/2009   History of mononucleosis 04/03/2009    Past Surgical History:  Procedure Laterality Date   ADENOIDECTOMY  05/2000   anterior cervical decompression  Left 12/12/2020   revision with removal of hardware and decompression  - done at [facility]   APPENDECTOMY  03/2013   CHOLECYSTECTOMY  09/2009   COLONOSCOPY WITH PROPOFOL  N/A 04/30/2021   Procedure: COLONOSCOPY WITH PROPOFOL ;  Surgeon: Unk Corinn Skiff, MD;  Location: [facility] ENDOSCOPY;  Service: Gastroenterology;  Laterality: N/A;   SPINE SURGERY     TONSILLECTOMY  05/2000   TOOTH EXTRACTION  05/23/2023   TYMPANOSTOMY TUBE PLACEMENT      Family History  Problem Relation Age of Onset   Urticaria Mother    Depression Mother    Hypertension Mother        controlled   Allergic rhinitis Mother    Anxiety disorder Mother    Obesity Mother    Diabetes Father        controlled   Hypertension Father    Allergic rhinitis Father    Depression Father    Obesity Father    Depression Brother    Anxiety disorder Brother    Obesity Brother    Allergic rhinitis Maternal Grandmother    Miscarriages / Stillbirths Maternal Grandmother    Stroke Maternal Grandfather    Diabetes Paternal Grandmother    Cancer Maternal Uncle 55       colon cancer   Diabetes Maternal Uncle     Social History   Tobacco Use   Smoking status: Never   Smokeless tobacco: Never  Substance Use Topics   Alcohol use: Not Currently    Current Medications[1]  Allergies[2]  I personally reviewed  active problem list, medication list, allergies with the patient/caregiver today.   ROS  Ten systems reviewed and is negative except as mentioned in HPI    Objective Physical Exam  CONSTITUTIONAL: Patient appears well-developed and well-nourished.  No distress. HEENT: Head atraumatic, normocephalic, neck supple. CARDIOVASCULAR: Normal rate, regular rhythm and normal heart sounds.  No murmur heard. No BLE edema. PULMONARY: Effort normal and breath sounds normal. No respiratory distress. ABDOMINAL: There is no tenderness or distention. MUSCULOSKELETAL: Normal gait. Without gross motor or sensory deficit. PSYCHIATRIC: Patient has a normal mood and affect. behavior is normal. Judgment and thought content normal.  Vitals:   11/15/24 0830  BP: 96/64  Pulse: 86  Resp: 16  SpO2: 96%  Weight: 250 lb 9.6 oz (113.7 kg)  Height: 5' 5 (1.651 m)    Body mass index is 41.7 kg/m.   PHQ2/9:    11/15/2024    8:29 AM 08/18/2024    8:32 AM 05/28/2024    8:38 AM 03/29/2024    8:25 AM 02/17/2024    8:17 AM  Depression  screen PHQ 2/9  Decreased Interest 2 1 2 1 1   Down, Depressed, Hopeless 2 1 2 1 1   PHQ - 2 Score 4 2 4 2 2   Altered sleeping 0 0 2 1 0  Tired, decreased energy 0 0 2 1 0  Change in appetite 0 0 2 1 0  Feeling bad or failure about yourself  0 0 0 1 0  Trouble concentrating 2 1 2 1 1   Moving slowly or fidgety/restless 0 0 0 0 0  Suicidal thoughts 0 0 0 0 0  PHQ-9 Score 6 3  12  7  3    Difficult doing work/chores Somewhat difficult Somewhat difficult Very difficult Somewhat difficult Somewhat difficult     Data saved with a previous flowsheet row definition    phq 9 is positive  Fall Risk:    11/15/2024    8:25 AM 08/18/2024    8:28 AM 05/28/2024    8:36 AM 03/29/2024    8:21 AM 11/14/2023    9:23 AM  Fall Risk   Falls in the past year? 0 0 0 0 0  Number falls in past yr: 0 0 0 0   Injury with Fall? 0 0  0  0    Risk for fall due to : No Fall Risks No Fall Risks No  Fall Risks No Fall Risks No Fall Risks  Follow up Falls evaluation completed Falls evaluation completed Falls evaluation completed Falls prevention discussed;Education provided;Falls evaluation completed Falls prevention discussed     Data saved with a previous flowsheet row definition    Assessment & Plan Morbid obesity Significant weight loss achieved with Zepbound , reducing BMI from over 50 to 41. Continued weight loss expected with current regimen. - Continue Zepbound  12.5 mg for weight loss. - Encouraged hydration with electrolyte solutions or Noom tablets. - Encouraged strength training and home exercises.  Migraine Episodes occur once to twice a month, lasting one to two days. Managed with Emgality , Nurtec, and Zavzpret  as needed. - Continue Emgality  for migraine prevention. - Use Nurtec and Zavzpret  as needed for acute migraine episodes.  Moderate persistent allergic asthma Asthma managed with Symbicort , Xyzal , Singulair , and Loretadine. Allergy shots have improved symptoms. Symbicort  is used as needed during flares. - Continue Symbicort  as needed for asthma flares. - Continue allergy shots and antihistamines for allergy management.  Chronic maxillary sinusitis with deviated septum Recurrent sinusitis four times a year. ENT recommended saline spray and discussed surgical options for deviated septum. Surgery not pursued due to work constraints and insurance limitations. - Continue saline spray for sinusitis management.  Bipolar affective disorder, currently depressed, mild Currently on Effexor 75 mg, which is well-tolerated without side effects. Anxiety is more prominent than depression. - Continue Effexor 75 mg for mood stabilization.  Attention deficit disorder Managed with extended-release stimulant in the morning and additional dose at lunch. - Continue current stimulant regimen for ADD. - Obtain Airsupra  for as-needed use.        [1]  Current Outpatient  Medications:    Albuterol -Budesonide  (AIRSUPRA ) 90-80 MCG/ACT AERO, Inhale 2 puffs into the lungs every 6 (six) hours as needed., Disp: 10.7 g, Rfl: 2   amitriptyline (ELAVIL) 25 MG tablet, Take 25 mg by mouth at bedtime., Disp: , Rfl:    baclofen  (LIORESAL ) 10 MG tablet, Take 10 mg by mouth 3 (three) times daily., Disp: , Rfl:    budesonide -formoterol  (SYMBICORT ) 160-4.5 MCG/ACT inhaler, Inhale 2 puffs into the lungs 2 (two) times daily., Disp: 30.6  g, Rfl: 1   cholecalciferol (VITAMIN D3) 25 MCG (1000 UNIT) tablet, Take 1,000 Units by mouth daily., Disp: , Rfl:    clonazePAM  (KLONOPIN ) 0.5 MG tablet, Take 1 tablet (0.5 mg total) by mouth 2 (two) times daily., Disp: 60 tablet, Rfl: 4   cyanocobalamin (VITAMIN B12) 250 MCG tablet, Take by mouth., Disp: , Rfl:    EPINEPHrine  (EPIPEN  2-PAK) 0.3 mg/0.3 mL IJ SOAJ injection, Inject 0.3 mg into the muscle as needed for anaphylaxis., Disp: 0.3 mL, Rfl: 1   famotidine (PEPCID) 20 MG tablet, Take 20 mg by mouth 2 (two) times daily., Disp: , Rfl:    Galcanezumab -gnlm (EMGALITY ) 120 MG/ML SOAJ, Inject 1 each into the skin every 30 (thirty) days., Disp: , Rfl:    LamoTRIgine  250 MG TB24 24 hour tablet, Take 1 tablet by mouth daily., Disp: , Rfl:    LamoTRIgine  300 MG TB24 24 hour tablet, Take 1 tablet by mouth daily., Disp: , Rfl:    levocetirizine (XYZAL ) 5 MG tablet, Take 1 tablet (5 mg total) by mouth daily as needed for allergies (Can take an extra dose during flare ups.)., Disp: 180 tablet, Rfl: 1   loratadine  (CLARITIN ) 10 MG tablet, Take 1 tablet (10 mg total) by mouth daily as needed for allergies (Can take an extra dose during flare ups.)., Disp: 180 tablet, Rfl: 1   methylphenidate (METADATE CD) 20 MG CR capsule, Take 20 mg by mouth every morning., Disp: , Rfl:    methylphenidate (RITALIN) 20 MG tablet, Take 20 mg by mouth every evening., Disp: , Rfl:    montelukast  (SINGULAIR ) 10 MG tablet, Take 1 tablet (10 mg total) by mouth every evening., Disp:  90 tablet, Rfl: 1   norethindrone  (AYGESTIN ) 5 MG tablet, Take 5 mg by mouth daily., Disp: , Rfl:    Olopatadine  HCl (PATADAY ) 0.2 % SOLN, Place 1 drop into both eyes 1 day or 1 dose., Disp: 7.5 mL, Rfl: 1   Olopatadine -Mometasone (RYALTRIS ) 665-25 MCG/ACT SUSP, Place 1 spray into the nose 2 (two) times daily., Disp: 29 g, Rfl: 5   ondansetron  (ZOFRAN -ODT) 8 MG disintegrating tablet, Take 1 tablet (8 mg total) by mouth every 8 (eight) hours as needed for nausea or vomiting., Disp: 20 tablet, Rfl: 6   propranolol  ER (INDERAL  LA) 160 MG SR capsule, Take 160 mg by mouth daily., Disp: , Rfl:    Rimegepant Sulfate (NURTEC) 75 MG TBDP, Take 1 tablet (75 mg total) by mouth as needed (take 1 tablet at onset of headache, max is 1 tablet in 24 hours)., Disp: 8 tablet, Rfl: 11   Spacer/Aero-Holding Chambers DEVI, 1 Device by Does not apply route as directed., Disp: 1 each, Rfl: 1   tirzepatide  (ZEPBOUND ) 12.5 MG/0.5ML Pen, Inject 12.5 mg into the skin once a week., Disp: 6 mL, Rfl: 0   valACYclovir  (VALTREX ) 500 MG tablet, Take 1 tablet (500 mg total) by mouth daily. **Take 1 tablet by mouth 3 times daily during outbreaks ONLY**, Disp: 100 tablet, Rfl: 1   venlafaxine XR (EFFEXOR-XR) 75 MG 24 hr capsule, Take 75 mg by mouth., Disp: , Rfl:    Zavegepant HCl (ZAVZPRET ) 10 MG/ACT SOLN, Place 1 each into the nose daily as needed., Disp: 6 each, Rfl: 1   ipratropium-albuterol  (DUONEB) 0.5-2.5 (3) MG/3ML SOLN, Take 3 mLs by nebulization every 4 (four) hours as needed. (Patient not taking: Reported on 11/15/2024), Disp: 360 mL, Rfl: 2   sertraline  (ZOLOFT ) 100 MG tablet, Take 100 mg by mouth  daily. (Patient not taking: Reported on 11/15/2024), Disp: , Rfl:  [2]  Allergies Allergen Reactions   Amoxicillin-Pot Clavulanate Other (See Comments)    Mood swings Mood swings Mood swings   Apple Itching and Swelling    ONLY certain FRESH FRUITS cause throat itches & in spring, throat feels like it will swell.  (apples,  peaches, pears, plums, mango)   Prednisone Palpitations and Other (See Comments)    Severe mood swings and tachycardia   Tape Itching

## 2024-11-16 ENCOUNTER — Encounter: Payer: Self-pay | Admitting: Allergy and Immunology

## 2024-11-16 ENCOUNTER — Ambulatory Visit (INDEPENDENT_AMBULATORY_CARE_PROVIDER_SITE_OTHER): Payer: PRIVATE HEALTH INSURANCE

## 2024-11-16 ENCOUNTER — Other Ambulatory Visit: Payer: Self-pay | Admitting: Allergy and Immunology

## 2024-11-16 DIAGNOSIS — J309 Allergic rhinitis, unspecified: Secondary | ICD-10-CM

## 2024-11-17 ENCOUNTER — Other Ambulatory Visit: Payer: Self-pay | Admitting: *Deleted

## 2024-11-17 MED ORDER — LORATADINE 10 MG PO TABS: 10.0000 mg | ORAL_TABLET | Freq: Every day | ORAL | 1 refills | Status: AC | PRN

## 2024-11-17 MED ORDER — FAMOTIDINE 20 MG PO TABS: 20.0000 mg | ORAL_TABLET | Freq: Two times a day (BID) | ORAL | 5 refills | Status: AC

## 2024-11-23 ENCOUNTER — Ambulatory Visit (INDEPENDENT_AMBULATORY_CARE_PROVIDER_SITE_OTHER)

## 2024-11-23 DIAGNOSIS — J302 Other seasonal allergic rhinitis: Secondary | ICD-10-CM | POA: Diagnosis not present

## 2024-11-24 ENCOUNTER — Ambulatory Visit: Payer: PRIVATE HEALTH INSURANCE | Admitting: Family Medicine

## 2024-12-07 ENCOUNTER — Ambulatory Visit: Payer: PRIVATE HEALTH INSURANCE | Admitting: Allergy and Immunology

## 2024-12-15 NOTE — Progress Notes (Signed)
 VIALS MADE ON 12/15/24

## 2025-02-17 ENCOUNTER — Ambulatory Visit: Payer: PRIVATE HEALTH INSURANCE | Admitting: Family Medicine
